# Patient Record
Sex: Female | Born: 1955 | Race: Black or African American | Hispanic: No | Marital: Married | Smoking: Former smoker
Health system: Southern US, Community
[De-identification: ages and names within clinical notes are randomized; demographics above are authoritative.]

## PROBLEM LIST (undated history)

## (undated) DIAGNOSIS — J45909 Unspecified asthma, uncomplicated: Secondary | ICD-10-CM

## (undated) DIAGNOSIS — E119 Type 2 diabetes mellitus without complications: Secondary | ICD-10-CM

## (undated) DIAGNOSIS — Z9889 Other specified postprocedural states: Secondary | ICD-10-CM

## (undated) DIAGNOSIS — F1910 Other psychoactive substance abuse, uncomplicated: Secondary | ICD-10-CM

## (undated) DIAGNOSIS — T07XXXA Unspecified multiple injuries, initial encounter: Secondary | ICD-10-CM

## (undated) DIAGNOSIS — I10 Essential (primary) hypertension: Secondary | ICD-10-CM

## (undated) DIAGNOSIS — S4992XA Unspecified injury of left shoulder and upper arm, initial encounter: Secondary | ICD-10-CM

## (undated) DIAGNOSIS — S81809A Unspecified open wound, unspecified lower leg, initial encounter: Secondary | ICD-10-CM

## (undated) DIAGNOSIS — S82143A Displaced bicondylar fracture of unspecified tibia, initial encounter for closed fracture: Secondary | ICD-10-CM

## (undated) DIAGNOSIS — T7840XA Allergy, unspecified, initial encounter: Secondary | ICD-10-CM

## (undated) DIAGNOSIS — K219 Gastro-esophageal reflux disease without esophagitis: Secondary | ICD-10-CM

## (undated) DIAGNOSIS — N926 Irregular menstruation, unspecified: Secondary | ICD-10-CM

## (undated) DIAGNOSIS — E785 Hyperlipidemia, unspecified: Secondary | ICD-10-CM

## (undated) DIAGNOSIS — S72113A Displaced fracture of greater trochanter of unspecified femur, initial encounter for closed fracture: Secondary | ICD-10-CM

## (undated) HISTORY — DX: Allergy, unspecified, initial encounter: T78.40XA

## (undated) HISTORY — DX: Unspecified asthma, uncomplicated: J45.909

## (undated) HISTORY — DX: Displaced bicondylar fracture of unspecified tibia, initial encounter for closed fracture: S82.143A

## (undated) HISTORY — PX: ROTATOR CUFF REPAIR: SHX139

## (undated) HISTORY — DX: Displaced fracture of greater trochanter of unspecified femur, initial encounter for closed fracture: S72.113A

## (undated) HISTORY — DX: Essential (primary) hypertension: I10

## (undated) HISTORY — DX: Hyperlipidemia, unspecified: E78.5

## (undated) HISTORY — PX: HIP SURGERY: SHX245

## (undated) HISTORY — DX: Unspecified injury of left shoulder and upper arm, initial encounter: S49.92XA

## (undated) HISTORY — DX: Other psychoactive substance abuse, uncomplicated: F19.10

## (undated) HISTORY — DX: Unspecified multiple injuries, initial encounter: T07.XXXA

## (undated) HISTORY — DX: Gastro-esophageal reflux disease without esophagitis: K21.9

## (undated) HISTORY — PX: OTHER SURGICAL HISTORY: SHX169

## (undated) HISTORY — PX: FOOT SURGERY: SHX648

## (undated) HISTORY — PX: SIGMOIDOSCOPY: SUR1295

## (undated) HISTORY — DX: Other specified postprocedural states: Z98.89

## (undated) HISTORY — DX: Irregular menstruation, unspecified: N92.6

## (undated) HISTORY — PX: CERVICAL SPINE SURGERY: SHX589

## (undated) HISTORY — DX: Unspecified open wound, unspecified lower leg, initial encounter: S81.809A

---

## 1998-01-29 ENCOUNTER — Emergency Department (HOSPITAL_COMMUNITY): Admission: EM | Admit: 1998-01-29 | Discharge: 1998-01-29 | Payer: Self-pay | Admitting: Family Medicine

## 2001-02-07 ENCOUNTER — Encounter: Admission: RE | Admit: 2001-02-07 | Discharge: 2001-05-08 | Payer: Self-pay | Admitting: Family Medicine

## 2003-10-13 ENCOUNTER — Encounter: Admission: RE | Admit: 2003-10-13 | Discharge: 2003-10-13 | Payer: Self-pay | Admitting: Family Medicine

## 2003-10-17 ENCOUNTER — Emergency Department (HOSPITAL_COMMUNITY): Admission: EM | Admit: 2003-10-17 | Discharge: 2003-10-17 | Payer: Self-pay | Admitting: Emergency Medicine

## 2003-10-29 ENCOUNTER — Encounter: Admission: RE | Admit: 2003-10-29 | Discharge: 2003-10-29 | Payer: Self-pay | Admitting: Sports Medicine

## 2003-10-30 ENCOUNTER — Encounter: Admission: RE | Admit: 2003-10-30 | Discharge: 2003-10-30 | Payer: Self-pay | Admitting: Family Medicine

## 2004-06-23 ENCOUNTER — Ambulatory Visit: Payer: Self-pay | Admitting: Family Medicine

## 2004-07-13 ENCOUNTER — Ambulatory Visit: Payer: Self-pay | Admitting: Family Medicine

## 2004-07-19 ENCOUNTER — Ambulatory Visit: Payer: Self-pay | Admitting: Family Medicine

## 2004-09-27 ENCOUNTER — Ambulatory Visit: Payer: Self-pay | Admitting: Sports Medicine

## 2004-09-29 ENCOUNTER — Encounter: Admission: RE | Admit: 2004-09-29 | Discharge: 2004-09-29 | Payer: Self-pay | Admitting: Sports Medicine

## 2005-03-07 ENCOUNTER — Emergency Department (HOSPITAL_COMMUNITY): Admission: EM | Admit: 2005-03-07 | Discharge: 2005-03-07 | Payer: Self-pay | Admitting: Emergency Medicine

## 2005-05-11 ENCOUNTER — Encounter (INDEPENDENT_AMBULATORY_CARE_PROVIDER_SITE_OTHER): Payer: Self-pay | Admitting: *Deleted

## 2005-05-11 LAB — CONVERTED CEMR LAB

## 2005-05-16 ENCOUNTER — Ambulatory Visit: Payer: Self-pay | Admitting: Sports Medicine

## 2005-06-01 ENCOUNTER — Encounter: Admission: RE | Admit: 2005-06-01 | Discharge: 2005-06-01 | Payer: Self-pay | Admitting: Sports Medicine

## 2005-06-06 ENCOUNTER — Ambulatory Visit: Payer: Self-pay | Admitting: Family Medicine

## 2005-06-09 ENCOUNTER — Ambulatory Visit: Payer: Self-pay | Admitting: Sports Medicine

## 2005-06-13 ENCOUNTER — Encounter: Admission: RE | Admit: 2005-06-13 | Discharge: 2005-06-13 | Payer: Self-pay | Admitting: Family Medicine

## 2005-06-30 ENCOUNTER — Ambulatory Visit: Payer: Self-pay | Admitting: Family Medicine

## 2005-07-27 ENCOUNTER — Ambulatory Visit: Payer: Self-pay | Admitting: Family Medicine

## 2005-11-11 ENCOUNTER — Ambulatory Visit: Payer: Self-pay | Admitting: Family Medicine

## 2005-11-18 ENCOUNTER — Ambulatory Visit: Payer: Self-pay | Admitting: Family Medicine

## 2005-11-24 ENCOUNTER — Encounter: Admission: RE | Admit: 2005-11-24 | Discharge: 2005-11-24 | Payer: Self-pay | Admitting: *Deleted

## 2006-01-24 ENCOUNTER — Ambulatory Visit: Payer: Self-pay | Admitting: Family Medicine

## 2006-09-07 DIAGNOSIS — E1165 Type 2 diabetes mellitus with hyperglycemia: Secondary | ICD-10-CM | POA: Insufficient documentation

## 2006-09-07 DIAGNOSIS — J45909 Unspecified asthma, uncomplicated: Secondary | ICD-10-CM

## 2006-09-07 DIAGNOSIS — M545 Low back pain, unspecified: Secondary | ICD-10-CM | POA: Insufficient documentation

## 2006-09-07 DIAGNOSIS — Z720 Tobacco use: Secondary | ICD-10-CM | POA: Insufficient documentation

## 2006-09-07 DIAGNOSIS — J309 Allergic rhinitis, unspecified: Secondary | ICD-10-CM | POA: Insufficient documentation

## 2006-09-07 DIAGNOSIS — Z87891 Personal history of nicotine dependence: Secondary | ICD-10-CM | POA: Insufficient documentation

## 2006-09-07 DIAGNOSIS — F17200 Nicotine dependence, unspecified, uncomplicated: Secondary | ICD-10-CM

## 2006-09-07 DIAGNOSIS — K219 Gastro-esophageal reflux disease without esophagitis: Secondary | ICD-10-CM

## 2006-09-07 DIAGNOSIS — I10 Essential (primary) hypertension: Secondary | ICD-10-CM | POA: Insufficient documentation

## 2006-09-07 DIAGNOSIS — E119 Type 2 diabetes mellitus without complications: Secondary | ICD-10-CM | POA: Insufficient documentation

## 2006-09-07 HISTORY — DX: Gastro-esophageal reflux disease without esophagitis: K21.9

## 2006-09-07 HISTORY — DX: Unspecified asthma, uncomplicated: J45.909

## 2006-09-08 ENCOUNTER — Encounter (INDEPENDENT_AMBULATORY_CARE_PROVIDER_SITE_OTHER): Payer: Self-pay | Admitting: *Deleted

## 2006-11-21 ENCOUNTER — Telehealth: Payer: Self-pay | Admitting: *Deleted

## 2007-03-31 ENCOUNTER — Emergency Department (HOSPITAL_COMMUNITY): Admission: EM | Admit: 2007-03-31 | Discharge: 2007-04-01 | Payer: Self-pay | Admitting: Emergency Medicine

## 2007-04-04 ENCOUNTER — Telehealth: Payer: Self-pay | Admitting: *Deleted

## 2007-04-05 ENCOUNTER — Ambulatory Visit: Payer: Self-pay | Admitting: Family Medicine

## 2007-04-18 ENCOUNTER — Ambulatory Visit: Payer: Self-pay | Admitting: Family Medicine

## 2007-04-18 ENCOUNTER — Encounter (INDEPENDENT_AMBULATORY_CARE_PROVIDER_SITE_OTHER): Payer: Self-pay | Admitting: *Deleted

## 2007-04-18 DIAGNOSIS — I10 Essential (primary) hypertension: Secondary | ICD-10-CM | POA: Insufficient documentation

## 2007-04-18 DIAGNOSIS — E782 Mixed hyperlipidemia: Secondary | ICD-10-CM | POA: Insufficient documentation

## 2007-04-18 LAB — CONVERTED CEMR LAB: Hgb A1c MFr Bld: 10.8 %

## 2007-04-19 LAB — CONVERTED CEMR LAB
ALT: <8 units/L (ref 0–35)
AST: 11 units/L (ref 0–37)
Albumin: 4.1 g/dL (ref 3.5–5.2)
Alkaline Phosphatase: 76 units/L (ref 39–117)
BUN: 15 mg/dL (ref 6–23)
CO2: 24 meq/L (ref 19–32)
Calcium: 9.4 mg/dL (ref 8.4–10.5)
Chloride: 102 meq/L (ref 96–112)
Creatinine, Ser: 0.7 mg/dL (ref 0.40–1.20)
Direct LDL: 162 mg/dL — ABNORMAL HIGH
Glucose, Bld: 380 mg/dL — ABNORMAL HIGH (ref 70–99)
Potassium: 4.8 meq/L (ref 3.5–5.3)
Sodium: 135 meq/L (ref 135–145)
Total Bilirubin: 0.3 mg/dL (ref 0.3–1.2)
Total Protein: 7.5 g/dL (ref 6.0–8.3)

## 2008-01-25 ENCOUNTER — Telehealth: Payer: Self-pay | Admitting: *Deleted

## 2008-02-01 ENCOUNTER — Telehealth: Payer: Self-pay | Admitting: *Deleted

## 2008-02-01 ENCOUNTER — Ambulatory Visit: Payer: Self-pay | Admitting: Family Medicine

## 2008-02-01 LAB — CONVERTED CEMR LAB: Hgb A1c MFr Bld: 12.3 %

## 2008-02-06 ENCOUNTER — Ambulatory Visit: Payer: Self-pay | Admitting: Family Medicine

## 2008-02-06 ENCOUNTER — Encounter: Payer: Self-pay | Admitting: Family Medicine

## 2008-02-08 ENCOUNTER — Encounter: Payer: Self-pay | Admitting: Family Medicine

## 2008-02-08 LAB — CONVERTED CEMR LAB
BUN: 12 mg/dL (ref 6–23)
CO2: 20 meq/L (ref 19–32)
Calcium: 9.2 mg/dL (ref 8.4–10.5)
Chloride: 104 meq/L (ref 96–112)
Cholesterol: 186 mg/dL (ref 0–200)
Creatinine, Ser: 0.66 mg/dL (ref 0.40–1.20)
Glucose, Bld: 235 mg/dL — ABNORMAL HIGH (ref 70–99)
HDL: 41 mg/dL (ref >39)
LDL Cholesterol: 124 mg/dL — ABNORMAL HIGH (ref 0–99)
Potassium: 4.4 meq/L (ref 3.5–5.3)
Sodium: 138 meq/L (ref 135–145)
Total CHOL/HDL Ratio: 4.5
Triglycerides: 106 mg/dL (ref <150)
VLDL: 21 mg/dL (ref 0–40)

## 2008-03-04 ENCOUNTER — Emergency Department (HOSPITAL_COMMUNITY): Admission: EM | Admit: 2008-03-04 | Discharge: 2008-03-04 | Payer: Self-pay | Admitting: Emergency Medicine

## 2008-03-25 ENCOUNTER — Encounter (INDEPENDENT_AMBULATORY_CARE_PROVIDER_SITE_OTHER): Payer: Self-pay | Admitting: *Deleted

## 2008-03-25 ENCOUNTER — Ambulatory Visit: Payer: Self-pay | Admitting: Family Medicine

## 2008-03-25 DIAGNOSIS — R109 Unspecified abdominal pain: Secondary | ICD-10-CM | POA: Insufficient documentation

## 2008-03-26 ENCOUNTER — Encounter: Payer: Self-pay | Admitting: Family Medicine

## 2008-03-26 ENCOUNTER — Encounter (INDEPENDENT_AMBULATORY_CARE_PROVIDER_SITE_OTHER): Payer: Self-pay | Admitting: *Deleted

## 2008-04-07 ENCOUNTER — Telehealth: Payer: Self-pay | Admitting: Family Medicine

## 2008-04-08 ENCOUNTER — Encounter: Payer: Self-pay | Admitting: Family Medicine

## 2008-04-08 ENCOUNTER — Ambulatory Visit: Payer: Self-pay | Admitting: Family Medicine

## 2008-04-17 ENCOUNTER — Encounter: Payer: Self-pay | Admitting: Family Medicine

## 2008-04-17 LAB — CONVERTED CEMR LAB
Basophils Absolute: 0.1 10*3/uL (ref 0.0–0.1)
Basophils Relative: 1 % (ref 0–1)
CRP, High Sensitivity: 0.6
Eosinophils Absolute: 0.3 10*3/uL (ref 0.0–0.7)
Eosinophils Relative: 3 % (ref 0–5)
HCT: 40.1 % (ref 36.0–46.0)
Hemoglobin: 12.7 g/dL (ref 12.0–15.0)
Lymphocytes Relative: 28 % (ref 12–46)
Lymphs Abs: 2.9 10*3/uL (ref 0.7–4.0)
MCHC: 31.7 g/dL (ref 30.0–36.0)
MCV: 87.4 fL (ref 78.0–100.0)
Monocytes Absolute: 0.8 10*3/uL (ref 0.1–1.0)
Monocytes Relative: 8 % (ref 3–12)
Neutro Abs: 6 10*3/uL (ref 1.7–7.7)
Neutrophils Relative %: 60 % (ref 43–77)
Platelets: 427 10*3/uL — ABNORMAL HIGH (ref 150–400)
RBC: 4.59 M/uL (ref 3.87–5.11)
RDW: 15.2 % (ref 11.5–15.5)
WBC: 10.1 10*3/uL (ref 4.0–10.5)

## 2008-05-01 ENCOUNTER — Telehealth: Payer: Self-pay | Admitting: Family Medicine

## 2008-05-05 ENCOUNTER — Encounter: Admission: RE | Admit: 2008-05-05 | Discharge: 2008-05-05 | Payer: Self-pay | Admitting: Orthopaedic Surgery

## 2008-05-12 ENCOUNTER — Telehealth: Payer: Self-pay | Admitting: Family Medicine

## 2008-08-20 ENCOUNTER — Telehealth: Payer: Self-pay | Admitting: *Deleted

## 2008-08-29 ENCOUNTER — Encounter: Admission: RE | Admit: 2008-08-29 | Discharge: 2008-08-29 | Payer: Self-pay | Admitting: Family Medicine

## 2008-09-04 ENCOUNTER — Encounter: Payer: Self-pay | Admitting: Family Medicine

## 2008-09-04 ENCOUNTER — Encounter: Admission: RE | Admit: 2008-09-04 | Discharge: 2008-09-04 | Payer: Self-pay | Admitting: Family Medicine

## 2008-09-05 ENCOUNTER — Telehealth (INDEPENDENT_AMBULATORY_CARE_PROVIDER_SITE_OTHER): Payer: Self-pay | Admitting: *Deleted

## 2008-09-19 ENCOUNTER — Ambulatory Visit: Payer: Self-pay | Admitting: Family Medicine

## 2008-09-19 ENCOUNTER — Encounter: Payer: Self-pay | Admitting: Family Medicine

## 2008-09-19 DIAGNOSIS — N926 Irregular menstruation, unspecified: Secondary | ICD-10-CM | POA: Insufficient documentation

## 2008-09-19 HISTORY — DX: Irregular menstruation, unspecified: N92.6

## 2008-09-19 LAB — CONVERTED CEMR LAB
FSH: 35.1 milliintl units/mL
HCT: 38.9 % (ref 36.0–46.0)
Hemoglobin: 12.2 g/dL (ref 12.0–15.0)
Hgb A1c MFr Bld: 6.9 %
MCHC: 31.4 g/dL (ref 30.0–36.0)
MCV: 85.9 fL (ref 78.0–100.0)
Platelets: 499 10*3/uL — ABNORMAL HIGH (ref 150–400)
RBC: 4.53 M/uL (ref 3.87–5.11)
RDW: 14.3 % (ref 11.5–15.5)
WBC: 8.5 10*3/uL (ref 4.0–10.5)

## 2008-09-23 ENCOUNTER — Encounter: Admission: RE | Admit: 2008-09-23 | Discharge: 2008-09-23 | Payer: Self-pay | Admitting: Family Medicine

## 2008-09-25 DIAGNOSIS — N9489 Other specified conditions associated with female genital organs and menstrual cycle: Secondary | ICD-10-CM | POA: Insufficient documentation

## 2008-09-25 LAB — CONVERTED CEMR LAB: Pap Smear: NORMAL

## 2008-10-09 ENCOUNTER — Ambulatory Visit: Payer: Self-pay | Admitting: Family Medicine

## 2009-01-13 ENCOUNTER — Telehealth (INDEPENDENT_AMBULATORY_CARE_PROVIDER_SITE_OTHER): Payer: Self-pay | Admitting: Pharmacist

## 2009-04-24 ENCOUNTER — Telehealth: Payer: Self-pay | Admitting: Family Medicine

## 2009-05-01 ENCOUNTER — Ambulatory Visit: Payer: Self-pay | Admitting: Family Medicine

## 2009-05-01 DIAGNOSIS — M67919 Unspecified disorder of synovium and tendon, unspecified shoulder: Secondary | ICD-10-CM | POA: Insufficient documentation

## 2009-05-01 DIAGNOSIS — M719 Bursopathy, unspecified: Secondary | ICD-10-CM

## 2009-05-01 LAB — CONVERTED CEMR LAB: Hgb A1c MFr Bld: 11 %

## 2009-05-08 ENCOUNTER — Encounter: Payer: Self-pay | Admitting: Family Medicine

## 2009-05-08 ENCOUNTER — Ambulatory Visit: Payer: Self-pay | Admitting: Family Medicine

## 2009-05-08 LAB — CONVERTED CEMR LAB
ALT: <8 units/L (ref 0–35)
AST: 7 units/L (ref 0–37)
Albumin: 3.7 g/dL (ref 3.5–5.2)
Alkaline Phosphatase: 88 units/L (ref 39–117)
BUN: 16 mg/dL (ref 6–23)
CO2: 21 meq/L (ref 19–32)
Calcium: 9.1 mg/dL (ref 8.4–10.5)
Chloride: 102 meq/L (ref 96–112)
Cholesterol: 169 mg/dL (ref 0–200)
Creatinine, Ser: 0.67 mg/dL (ref 0.40–1.20)
Glucose, Bld: 290 mg/dL — ABNORMAL HIGH (ref 70–99)
HDL: 34 mg/dL — ABNORMAL LOW (ref >39)
LDL Cholesterol: 116 mg/dL — ABNORMAL HIGH (ref 0–99)
Potassium: 4.2 meq/L (ref 3.5–5.3)
Sodium: 138 meq/L (ref 135–145)
Total Bilirubin: 0.2 mg/dL — ABNORMAL LOW (ref 0.3–1.2)
Total CHOL/HDL Ratio: 5
Total Protein: 6.7 g/dL (ref 6.0–8.3)
Triglycerides: 96 mg/dL (ref <150)
VLDL: 19 mg/dL (ref 0–40)

## 2009-05-12 ENCOUNTER — Encounter: Payer: Self-pay | Admitting: Family Medicine

## 2009-05-26 ENCOUNTER — Encounter: Payer: Self-pay | Admitting: Family Medicine

## 2009-05-26 ENCOUNTER — Ambulatory Visit: Payer: Self-pay | Admitting: Family Medicine

## 2009-06-24 ENCOUNTER — Telehealth: Payer: Self-pay | Admitting: Family Medicine

## 2009-08-11 ENCOUNTER — Telehealth: Payer: Self-pay | Admitting: Family Medicine

## 2009-09-21 ENCOUNTER — Encounter: Admission: RE | Admit: 2009-09-21 | Discharge: 2009-09-21 | Payer: Self-pay | Admitting: Family Medicine

## 2009-09-30 ENCOUNTER — Ambulatory Visit: Payer: Self-pay | Admitting: Family Medicine

## 2009-09-30 LAB — CONVERTED CEMR LAB: Hgb A1c MFr Bld: >14 %

## 2009-10-23 ENCOUNTER — Ambulatory Visit: Payer: Self-pay | Admitting: Family Medicine

## 2009-10-23 DIAGNOSIS — M542 Cervicalgia: Secondary | ICD-10-CM | POA: Insufficient documentation

## 2009-12-18 ENCOUNTER — Telehealth: Payer: Self-pay | Admitting: Family Medicine

## 2009-12-21 ENCOUNTER — Telehealth: Payer: Self-pay | Admitting: Family Medicine

## 2009-12-21 ENCOUNTER — Encounter: Payer: Self-pay | Admitting: Family Medicine

## 2010-01-07 ENCOUNTER — Ambulatory Visit: Payer: Self-pay | Admitting: Family Medicine

## 2010-01-07 LAB — CONVERTED CEMR LAB
Albumin/Creatinine Ratio, Urine, POC: ABNORMAL
Hgb A1c MFr Bld: 11.5 %
Microalbumin U total vol: 10 mg/L

## 2010-01-26 ENCOUNTER — Telehealth: Payer: Self-pay | Admitting: Family Medicine

## 2010-02-11 ENCOUNTER — Telehealth: Payer: Self-pay | Admitting: *Deleted

## 2010-03-11 ENCOUNTER — Encounter: Payer: Self-pay | Admitting: Family Medicine

## 2010-03-11 ENCOUNTER — Ambulatory Visit: Payer: Self-pay | Admitting: Family Medicine

## 2010-03-11 DIAGNOSIS — Z9889 Other specified postprocedural states: Secondary | ICD-10-CM

## 2010-03-11 HISTORY — DX: Other specified postprocedural states: Z98.89

## 2010-03-24 ENCOUNTER — Encounter: Payer: Self-pay | Admitting: Family Medicine

## 2010-05-07 ENCOUNTER — Encounter (INDEPENDENT_AMBULATORY_CARE_PROVIDER_SITE_OTHER): Payer: Self-pay | Admitting: Pharmacist

## 2010-08-10 NOTE — Progress Notes (Signed)
Summary: Rx Req  Phone Note Refill Request Call back at [facility] Phone [phone] Message from:  Patient  Refills Requested: Medication #1:  MOBIC 7.5 MG TABS one by mouth daily for pain PT USES [facility] IN [place] [address]  Initial call taken by: Clydell Hakim,  August 11, 2009 10:26 AM  Follow-up for Phone Call        will forward to MD. Follow-up by: Theresia Lo RN,  August 11, 2009 2:45 PM    Prescriptions: MOBIC 7.5 MG TABS (MELOXICAM) one by mouth daily for pain  #30 x 2   Entered and Authorized by:   Myrtie Soman  MD   Signed by:   Myrtie Soman  MD on 08/17/2009   Method used:   Electronically to        [facility] (retail)       [address], [address]       [place], [place]  [address]       Ph: [phone]       Fax: [phone]   RxID:   [id number]

## 2010-08-10 NOTE — Letter (Signed)
Summary: Generic Letter  [facility]  [address]   [place], [place] [address]   Phone: [phone]  Fax: [phone]    03/26/2008  [facility] [place]ROBINSON [address] [place], [place]  [address]  Dear Ms. PAYNE-ROBINSON,   I have scheduled you for an appointment with Dr. Loreta Ave at [facility] to discuss your colonoscopy on April 10, 2008 at 1:45pm.  You will not be having the actual colonoscopy this day, you will be discussing the procedure. Their office address is [address], [address], [address], [place], [place] [address], and their phone number is [phone].  Please call their office if you need to reschedule.          Sincerely,   Enzo Treu MARTIN RN [facility]

## 2010-08-10 NOTE — Progress Notes (Signed)
Summary: refill  Phone Note Refill Request Call back at Home Phone [phone] Message from:  Patient  Refills Requested: Medication #1:  TRAMADOL HCL 50 MG TABS 1-2 tabs by mouth every 6 hours as needed [facility] - [phone] x [phone]   Initial call taken by: De Nurse,  December 18, 2009 3:22 PM  Follow-up for Phone Call        called in 90 pills, called pt told her she needs to be seen  Follow-up by: Antoine Primas DO,  December 21, 2009 10:18 AM    Prescriptions: TRAMADOL HCL 50 MG TABS (TRAMADOL HCL) 1-2 tabs by mouth every 6 hours as needed  #90 x 0   Entered by:   Antoine Primas DO   Authorized by:   Angelena Sole MD   Signed by:   Antoine Primas DO on 12/21/2009   Method used:   Telephoned to ...         RxID:   [id number]

## 2010-08-10 NOTE — Letter (Signed)
Summary: Out of Work  [facility]  [address]   [place], [place] [address]   Phone: [phone]  Fax: [phone]    March 11, 2010   Employee:  [facility] PAYNE-ROBINSON    To Whom It May Concern:   For Medical reasons, please excuse the above named employee from work for the following dates:  Start:   03-11-10  End:   03-17-10  If you need additional information, please feel free to contact our office.         Sincerely,    Alvia Grove DO

## 2010-08-10 NOTE — Progress Notes (Signed)
Summary: Returning call  Phone Note Call from Patient   Summary of Call: pt is returning Dr. Danford Bad call, she saw gyn MD, had ultrasound done & everything was fine. Initial call taken by: Knox Royalty,  December 21, 2009 3:25 PM  Follow-up for Phone Call        called pt and asked her to give [place] the name of ob-gyn office she saw so that we can get records . Follow-up by: Myrtie Soman  MD,  December 30, 2009 11:26 AM         Past History:  Past Medical History: hemorrhoids Diabetes mellitus, type II Hyperlipidemia Hypertension  mammogram normal 3/10 ovarian mass on [place] 3/10 -- per report pt had gyn follow-up and "everything was fine." but we do not have these records.   Appended Document: Returning call pt went to [facility] last March 2010  Appended Document: Returning call Called [facility].  She did not keep her appt.

## 2010-08-10 NOTE — Assessment & Plan Note (Signed)
Summary: FU/KH   Vital Signs:  Patient profile:   55 year old female Height:      64.5 inches Weight:      145.1 pounds BMI:     24.61 Temp:     98.1 degrees F oral Pulse rate:   88 / minute BP sitting:   133 / 81  (left arm) Cuff size:   regular  Vitals Entered By: Garen Grams LPN (October 23, 2009 11:34 AM) CC: f/u dm Is Patient Diabetic? Yes Did you bring your meter with you today? No   Primary Care Provider:  Myrtie Soman  MD  CC:  f/u dm.  History of Present Illness: 1. Diabetes taking medications:misses doses at night fairly frequently problems with medications?: no blood sugar testing frequency: when she thinks about it hypoglycemic events?: no subjective: has been doing better but often forgets doses. Does not check sugars on a schedule. Is trying to eat better but drinks lots of juice. Trying to stay away from breads, pancakes, biscuits. Not exercising regularly.  ROS chest pain: no   shortness of breath: no   polyuria:  no   polydipsia: no    problems with feet: no  2. Smoking Has the chantix but hasn't started it yet. Plans on starting once school gets out for the summer.   3. Hypertension adherent to medications: yes side effects from medications: no subjective:   ROS: HA:  no    swelling: no  vision changes: states that glasses don't work to well, notices a halo around car lights at night.   4. neck pain worse with overhead activities and at the end of a days work. occasionally has some shooting pains into the arm. No numbness or tingling.   Habits & Providers  Alcohol-Tobacco-Diet     Tobacco Status: current     Tobacco Counseling: to quit use of tobacco products  Current Medications (verified): 1)  Aspirin Childrens 81 Mg Chew (Aspirin) .... Take 1 Tablet By Mouth Once A Day 2)  Simvastatin 40 Mg Tabs (Simvastatin) .... Take One Tablet At Bedtime 3)  Metformin Hcl 1000 Mg Tabs (Metformin Hcl) .... Take 1 Tablet By Mouth Two Times A Day 4)   Glipizide 10 Mg  Tabs (Glipizide) .Marland Kitchen.. 1 By Mouth Two Times Daily 5)  Lisinopril 10 Mg Tabs (Lisinopril) .... One By Mouth Daily 6)  Chantix Starting Month Pak 0.5 Mg X 11 & 1 Mg X 42 Tabs (Varenicline Tartrate) .... Take Per Package Instructions 7)  Freestyle Lancets  Misc (Lancets) .... Use Daily Before Breakfast; Dispense 1 Box. 8)  Freestyle System  Kit (Blood Glucose Monitoring Suppl) .... Check Sugars Daily Before Breakfast 9)  Freestyle Lite Test  Strp (Glucose Blood) .... Test Sugar Daily Before Breakfast; Dispense 100 Strips (1 Box) 10)  Chantix Continuing Month Pak 1 Mg Tabs (Varenicline Tartrate) .... One By Mouth Two Times A Day As Directed; Disp 1 Pack 11)  Tramadol Hcl 50 Mg Tabs (Tramadol Hcl) .Marland Kitchen.. 1-2 Tabs By Mouth Every 6 Hours As Needed  Allergies (verified): No Known Drug Allergies  Physical Exam  General:  vital signs reviewed and normal Alert, appropriate; well-dressed and well-nourished  Lungs:  work of breathing unlabored, clear to auscultation bilaterally; no wheezes, rales, or ronchi; good air movement throughout  Heart:  regular rate and rhythm, no murmurs; normal s1/s2  Msk:  neck - some non-specific c-spine tenderness to palpation; pain with flexion and extension, mildly limited ROM; pain with rightward flexion; no  pain with rotation but limited rightward rotation. Spurling's elicits local neck pain but no radicular symptoms. Skin:  warm, good turgor; no rashes or lesions.  Psych:  Cognition and judgment appear intact. Alert and cooperative with normal attention span and concentration. No apparent delusions, illusions, hallucinations   Impression & Recommendations:  Problem # 1:  DIABETES MELLITUS, TYPE II (ICD-250.00) Assessment Unchanged modestly improved sugars per reports. Discussed the importance of adherence to medical therapy to prevent future complications. Also mentioned that we may need insulin but this could possibly be prevented if she does her best  to adhere to meds. Cap glucose elevated today.  Her updated medication list for this problem includes:    Aspirin Childrens 81 Mg Chew (Aspirin) .Marland Kitchen... Take 1 tablet by mouth once a day    Metformin Hcl 1000 Mg Tabs (Metformin hcl) .Marland Kitchen... Take 1 tablet by mouth two times a day    Glipizide 10 Mg Tabs (Glipizide) .Marland Kitchen... 1 by mouth two times daily    Lisinopril 10 Mg Tabs (Lisinopril) ..... One by mouth daily  Orders: Glucose Cap-[facility] ([address]) [facility]- Est  Level 4 ([address])Future Orders: Comp Met-[facility] ([id number]) ... 10/29/2010 Direct LDL-[facility] [phone]) ... 10/22/2010  Problem # 2:  TOBACCO DEPENDENCE (ICD-305.1) Assessment: Unchanged  planning to quit over summer Her updated medication list for this problem includes:    Chantix Starting Month Pak 0.5 Mg X 11 & 1 Mg X 42 Tabs (Varenicline tartrate) .Marland Kitchen... Take per package instructions    Chantix Continuing Month Pak 1 Mg Tabs (Varenicline tartrate) ..... One by mouth two times a day as directed; disp 1 pack  Orders: [facility]- Est  Level 4 ([address])  Problem # 3:  HYPERTENSION (ICD-401.9) Assessment: Unchanged  stable. continue ACEi.   Her updated medication list for this problem includes:    Lisinopril 10 Mg Tabs (Lisinopril) ..... One by mouth daily  Orders: [facility]- Est  Level 4 ([address])  Problem # 4:  NECK PAIN (ICD-723.1) Assessment: New  exam nonspecific today; has h/o rotator cuff syndrome on R. d/c mobic given CV risk associated with this. Increase tylenol and start tramadol. consider imaging if no improvement at next visit. possibly a neuropathic compenent. consider gabepentin. continue to follow.   The following medications were removed from the medication list:    Mobic 7.5 Mg Tabs (Meloxicam) ..... One by mouth daily for pain Her updated medication list for this problem includes:    Aspirin Childrens 81 Mg Chew (Aspirin) .Marland Kitchen... Take 1 tablet by mouth once a day    Tramadol Hcl 50 Mg Tabs (Tramadol hcl) .Marland Kitchen... 1-2 tabs by mouth every  6 hours as needed    Tylenol Arthritis Pain 650 Mg Cr-tabs (Acetaminophen) ..... One by mouth every 6 hours as needed  Orders: [facility]- Est  Level 4 ([address])  Complete Medication List: 1)  Aspirin Childrens 81 Mg Chew (Aspirin) .... Take 1 tablet by mouth once a day 2)  Simvastatin 40 Mg Tabs (Simvastatin) .... Take one tablet at bedtime 3)  Metformin Hcl 1000 Mg Tabs (Metformin hcl) .... Take 1 tablet by mouth two times a day 4)  Glipizide 10 Mg Tabs (Glipizide) .Marland Kitchen.. 1 by mouth two times daily 5)  Lisinopril 10 Mg Tabs (Lisinopril) .... One by mouth daily 6)  Chantix Starting Month Pak 0.5 Mg X 11 & 1 Mg X 42 Tabs (Varenicline tartrate) .... Take per package instructions 7)  Freestyle Lancets Misc (Lancets) .... Use daily before breakfast; dispense 1 box. 8)  Freestyle  System Kit (Blood glucose monitoring suppl) .... Check sugars daily before breakfast 9)  Freestyle Lite Test Strp (Glucose blood) .... Test sugar daily before breakfast; dispense 100 strips (1 box) 10)  Chantix Continuing Month Pak 1 Mg Tabs (Varenicline tartrate) .... One by mouth two times a day as directed; disp 1 pack 11)  Tramadol Hcl 50 Mg Tabs (Tramadol hcl) .Marland Kitchen.. 1-2 tabs by mouth every 6 hours as needed 12)  Tylenol Arthritis Pain 650 Mg Cr-tabs (Acetaminophen) .... One by mouth every 6 hours as needed  Patient Instructions: 1)  it's very important that you take your medicines regularly and don't miss doeses. set a reminder on your phone 2)  start the tramadol and tylenol -- take each every 6 hours 3)  use heat on your neck.  4)  make sure you visit the eye doctor soon.  5)  great to see you today. 6)  follow-up in one month Prescriptions: TYLENOL ARTHRITIS PAIN 650 MG CR-TABS (ACETAMINOPHEN) one by mouth every 6 hours as needed  #30 x 1   Entered and Authorized by:   Myrtie Soman  MD   Signed by:   Myrtie Soman  MD on 10/23/2009   Method used:   Electronically to        [facility] (retail)        [address], [address]       [place], [place]  [address]       Ph: [phone]       Fax: [phone]   RxID:   [id number] TRAMADOL HCL 50 MG TABS (TRAMADOL HCL) 1-2 tabs by mouth every 6 hours as needed  #60 x 1   Entered and Authorized by:   Myrtie Soman  MD   Signed by:   Myrtie Soman  MD on 10/23/2009   Method used:   Electronically to        [facility] [address] (retail)       [address], [address]       [place], [place]  [address]       Ph: [phone]       Fax: [phone]   RxID:   [id number]

## 2010-08-10 NOTE — Miscellaneous (Signed)
Summary: chart update  Clinical Lists Changes  Problems: Removed problem of VIRAL URI (ICD-465.9) Assessed OVARIAN MASS as comment only - on U/S 3/10. Pt has not followed up regularly since this time. I do not know if follow-up for this mass has taken place as recommended. I have called the patient (left VM) and sent a letter to get more information on this and am waiting on a response. Observations: Added new observation of PAST MED HX: hemorrhoids Diabetes mellitus, type II Hyperlipidemia Hypertension  mammogram normal 3/10 ovarian mass on [place] 3/10 -- I do not know if follow-up with gyn as advised has taken place.    (12/21/2009 11:08) Added new observation of PRIMARY MD: Myrtie Soman  MD (12/21/2009 11:08)       Impression & Recommendations:  Problem # 1:  OVARIAN MASS (ICD-625.8) on U/S 3/10. Pt has not followed up regularly since this time. I do not know if follow-up for this mass has taken place as recommended. I have called the patient (left VM) and sent a letter to get more information on this and am waiting on a response.   Complete Medication List: 1)  Aspirin Childrens 81 Mg Chew (Aspirin) .... Take 1 tablet by mouth once a day 2)  Simvastatin 40 Mg Tabs (Simvastatin) .... Take one tablet at bedtime 3)  Metformin Hcl 1000 Mg Tabs (Metformin hcl) .... Take 1 tablet by mouth two times a day 4)  Glipizide 10 Mg Tabs (Glipizide) .Marland Kitchen.. 1 by mouth two times daily 5)  Lisinopril 10 Mg Tabs (Lisinopril) .... One by mouth daily 6)  Chantix Starting Month Pak 0.5 Mg X 11 & 1 Mg X 42 Tabs (Varenicline tartrate) .... Take per package instructions 7)  Freestyle Lancets Misc (Lancets) .... Use daily before breakfast; dispense 1 box. 8)  Freestyle System Kit (Blood glucose monitoring suppl) .... Check sugars daily before breakfast 9)  Freestyle Lite Test Strp (Glucose blood) .... Test sugar daily before breakfast; dispense 100 strips (1 box) 10)  Chantix Continuing Month Pak 1 Mg  Tabs (Varenicline tartrate) .... One by mouth two times a day as directed; disp 1 pack 11)  Tramadol Hcl 50 Mg Tabs (Tramadol hcl) .Marland Kitchen.. 1-2 tabs by mouth every 6 hours as needed 12)  Tylenol Arthritis Pain 650 Mg Cr-tabs (Acetaminophen) .... One by mouth every 6 hours as needed   Past History:  Past Medical History: hemorrhoids Diabetes mellitus, type II Hyperlipidemia Hypertension  mammogram normal 3/10 ovarian mass on [place] 3/10 -- I do not know if follow-up with gyn as advised has taken place.

## 2010-08-10 NOTE — Miscellaneous (Signed)
   Clinical Lists Changes  Problems: Changed problem from ASTHMA, UNSPECIFIED (ICD-493.90) to ASTHMA, INTERMITTENT (ICD-493.90) 

## 2010-08-10 NOTE — Progress Notes (Signed)
Summary: Rx Req  Phone Note Refill Request Message from:  Patient  Refills Requested: Medication #1:  TRAMADOL HCL 50 MG TABS 1-2 tabs by mouth every 6 hours as needed   Dosage confirmed as above?Dosage Confirmed   Brand Name Necessary? No   Supply Requested: 1 month   Last Refilled: 01/27/2010  Medication #2:  TYLENOL ARTHRITIS PAIN 650 MG CR-TABS one by mouth every 6 hours as needed.   Dosage confirmed as above?Dosage Confirmed   Brand Name Necessary? No   Supply Requested: 3 months   Last Refilled: 01/27/2010 [facility]  Initial call taken by: Clydell Hakim,  January 26, 2010 9:08 AM  Follow-up for Phone Call        faxed in thank you  Follow-up by: Antoine Primas DO,  January 27, 2010 2:03 PM    Prescriptions: TYLENOL ARTHRITIS PAIN 650 MG CR-TABS (ACETAMINOPHEN) one by mouth every 6 hours as needed  #30 x 3   Entered and Authorized by:   Antoine Primas DO   Signed by:   Antoine Primas DO on 01/27/2010   Method used:   Print then Give to Patient   RxID:   [id number] TRAMADOL HCL 50 MG TABS (TRAMADOL HCL) 1-2 tabs by mouth every 6 hours as needed  #90 x 0   Entered and Authorized by:   Antoine Primas DO   Signed by:   Antoine Primas DO on 01/27/2010   Method used:   Print then Give to Patient   RxID:   [id number]

## 2010-08-10 NOTE — Assessment & Plan Note (Signed)
Summary: f/up,tcb   Vital Signs:  Patient profile:   55 year old female Height:      64.5 inches Weight:      143.0 pounds BMI:     24.25 Temp:     98.6 degrees F oral Pulse rate:   98 / minute BP sitting:   134 / 82  (right arm) Cuff size:   regular  Vitals Entered By: Garen Grams LPN (September 30, 2009 3:07 PM) CC: f/u dm, refills Is Patient Diabetic? Yes Did you bring your meter with you today? No   Primary Care Provider:  Myrtie Soman  MD  CC:  f/u dm and refills.  History of Present Illness: 1. Diabetes taking medications: has been out of her medications for the last 3 weeks; even when she had the medicines, wasn't taking them every day like she was supposed to. States that she thought she had to be seen before she could get more medicine.  problems with medications?: no blood sugar testing frequency: doesn't test hypoglycemic events?: no subjective: has not been paying attention to this issue for the last several months. Verbalizes understanding of the long term health risks.   ROS chest pain:no   shortness of breath: no   polyuria: yes  polydipsia:  yes   problems with feet:  joint aches and pains: has occasional sharp pains in legs, back, and side -- feels like a "prick" and goes away when she touches it.   2. smoking Took the chantix and states that it worked. But started smoking again due to stress. Now back to 1/2 PPD  -- this is less than previously. Trying to wean her way off.    Habits & Providers  Alcohol-Tobacco-Diet     Tobacco Status: current     Cigarette Packs/Day: 0.5  Current Medications (verified): 1)  Aspirin Childrens 81 Mg Chew (Aspirin) .... Take 1 Tablet By Mouth Once A Day 2)  Simvastatin 40 Mg Tabs (Simvastatin) .... Take One Tablet At Bedtime 3)  Metformin Hcl 1000 Mg Tabs (Metformin Hcl) .... Take 1 Tablet By Mouth Two Times A Day 4)  Glipizide 10 Mg  Tabs (Glipizide) .Marland Kitchen.. 1 By Mouth Two Times Daily 5)  Lisinopril 10 Mg Tabs  (Lisinopril) .... One By Mouth Daily 6)  Chantix Starting Month Pak 0.5 Mg X 11 & 1 Mg X 42 Tabs (Varenicline Tartrate) .... Take Per Package Instructions 7)  Freestyle Lancets  Misc (Lancets) .... Use Daily Before Breakfast; Dispense 1 Box. 8)  Freestyle System  Kit (Blood Glucose Monitoring Suppl) .... Check Sugars Daily Before Breakfast 9)  Freestyle Lite Test  Strp (Glucose Blood) .... Test Sugar Daily Before Breakfast; Dispense 100 Strips (1 Box) 10)  Mobic 7.5 Mg Tabs (Meloxicam) .... One By Mouth Daily For Pain 11)  Chantix Continuing Month Pak 1 Mg Tabs (Varenicline Tartrate) .... One By Mouth Two Times A Day As Directed; Disp 1 Pack  Allergies (verified): No Known Drug Allergies  Social History: Moved back here from [place] approx 6 yrs ago.  Lives with her husband Rich Fuchs X 9 yrs.  Has 3 grown sons.  Smokes 1/2 PPD X 27 yrs.  No EtOH.  No drug use.  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 Packs/Day:  0.5  Review of Systems       review of systems as noted in HPI section   Physical Exam  General:  vital signs reviewed and normal Alert, appropriate; well-dressed  and well-nourished  Mouth:  oropharynx pink, moist; no erythema or exudate ; looks well-hydrated Lungs:  work of breathing unlabored, clear to auscultation bilaterally; no wheezes, rales, or ronchi; good air movement throughout  Heart:  regular rate and rhythm, no murmurs; normal s1/s2  Skin:  warm, good turgor; no rashes or lesions. brisk cap refill   Diabetes Management Exam:    Foot Exam (with socks and/or shoes not present):       Sensory-Pinprick/Light touch:          Left medial foot (L-4): normal          Left dorsal foot (L-5): normal          Left lateral foot (S-1): normal          Right medial foot (L-4): normal          Right dorsal foot (L-5): normal          Right lateral foot (S-1): normal       Sensory-Monofilament:          Left foot: normal          Right  foot: normal       Inspection:          Left foot: normal          Right foot: normal       Nails:          Left foot: normal          Right foot: normal   Impression & Recommendations:  Problem # 1:  DIABETES MELLITUS, TYPE II (ICD-250.00) Assessment Deteriorated Pt has essentially ignored this issue and A1c is greatly deteriorated. Has demonstrated good glycemic control with A1c of 6.9 as recently as 3/10.  Corrected the pt's misunderstanding about not being able to get refills without an appointment. Did my best to frankly discuss the gravity of failing to attend to this issue and the health consequences that would almost certainly ensue. Marland Kitchen Pt expressed understanding. Agrees to come back in 2 weeks. Restart medicines and two times a day glucose checks. Gave pt glucose log and educational material regarding her diabetes. Plan to get labs at next visit.   Her updated medication list for this problem includes:    Aspirin Childrens 81 Mg Chew (Aspirin) .Marland Kitchen... Take 1 tablet by mouth once a day    Metformin Hcl 1000 Mg Tabs (Metformin hcl) .Marland Kitchen... Take 1 tablet by mouth two times a day    Glipizide 10 Mg Tabs (Glipizide) .Marland Kitchen... 1 by mouth two times daily    Lisinopril 10 Mg Tabs (Lisinopril) ..... One by mouth daily  Orders: A1C-[facility] (98119)  Labs Reviewed: Creat: 0.67 (05/08/2009)    Reviewed HgBA1c results: 11.0 (05/01/2009)  6.9 (09/19/2008)  Problem # 2:  TOBACCO DEPENDENCE (ICD-305.1) Assessment: Unchanged  restart chantix. Pt would like to quit.  Her updated medication list for this problem includes:    Chantix Starting Month Pak 0.5 Mg X 11 & 1 Mg X 42 Tabs (Varenicline tartrate) .Marland Kitchen... Take per package instructions    Chantix Continuing Month Pak 1 Mg Tabs (Varenicline tartrate) ..... One by mouth two times a day as directed; disp 1 pack  Orders: [facility]- Est  Level 4 (14782)  Complete Medication List: 1)  Aspirin Childrens 81 Mg Chew (Aspirin) .... Take 1 tablet by mouth once a  day 2)  Simvastatin 40 Mg Tabs (Simvastatin) .... Take one tablet at bedtime 3)  Metformin Hcl 1000 Mg Tabs (  Metformin hcl) .... Take 1 tablet by mouth two times a day 4)  Glipizide 10 Mg Tabs (Glipizide) .Marland Kitchen.. 1 by mouth two times daily 5)  Lisinopril 10 Mg Tabs (Lisinopril) .... One by mouth daily 6)  Chantix Starting Month Pak 0.5 Mg X 11 & 1 Mg X 42 Tabs (Varenicline tartrate) .... Take per package instructions 7)  Freestyle Lancets Misc (Lancets) .... Use daily before breakfast; dispense 1 box. 8)  Freestyle System Kit (Blood glucose monitoring suppl) .... Check sugars daily before breakfast 9)  Freestyle Lite Test Strp (Glucose blood) .... Test sugar daily before breakfast; dispense 100 strips (1 box) 10)  Mobic 7.5 Mg Tabs (Meloxicam) .... One by mouth daily for pain 11)  Chantix Continuing Month Pak 1 Mg Tabs (Varenicline tartrate) .... One by mouth two times a day as directed; disp 1 pack  Patient Instructions: 1)  restart you medicines. 2)  Please start checking your sugars two times a day (when you wake up and before bed).  3)  Please call me if you have any problems getting your medicines, your sugar testing supplies, or other issues. 4)  Please follow-up with me in 2 weeks. 5)  If we don't get your diabetes under better control, I'm worried that you're going to have a bad complication like a stroke, heart attack, or kidney failure.  Prescriptions: CHANTIX CONTINUING MONTH PAK 1 MG TABS (VARENICLINE TARTRATE) one by mouth two times a day as directed; disp 1 pack  #1 x 0   Entered and Authorized by:   Myrtie Soman  MD   Signed by:   Myrtie Soman  MD on 09/30/2009   Method used:   Electronically to        [facility] [address] (retail)       [address], [address]       [place], [place]  [address]       Ph: [phone]       Fax: [phone]   RxID:   [id number] CHANTIX STARTING MONTH PAK 0.5 MG X 11 & 1 MG X 42 TABS (VARENICLINE  TARTRATE) take per package instructions  #1 x 0   Entered and Authorized by:   Myrtie Soman  MD   Signed by:   Myrtie Soman  MD on 09/30/2009   Method used:   Electronically to        [facility] [address] (retail)       [address], [address]       [place], [place]  [address]       Ph: [phone]       Fax: [phone]   RxID:   [id number] FREESTYLE LITE TEST  STRP (GLUCOSE BLOOD) test sugar daily before breakfast; dispense 100 strips (1 box)  #1 x 11   Entered and Authorized by:   Myrtie Soman  MD   Signed by:   Myrtie Soman  MD on 09/30/2009   Method used:   Electronically to        [facility] [address] (retail)       [address], [address]       [place], [place]  [address]       Ph: [phone]       Fax: [phone]   RxID:   [id number] FREESTYLE SYSTEM  KIT (  BLOOD GLUCOSE MONITORING SUPPL) check sugars daily before breakfast  #1 x 0   Entered and Authorized by:   Myrtie Soman  MD   Signed by:   Myrtie Soman  MD on 09/30/2009   Method used:   Electronically to        [facility] [address] (retail)       [address], [address]       [place], [place]  [address]       Ph: [phone]       Fax: [phone]   RxID:   [id number] FREESTYLE LANCETS  MISC (LANCETS) use daily before breakfast; dispense 1 box.  #1 x 11   Entered and Authorized by:   Myrtie Soman  MD   Signed by:   Myrtie Soman  MD on 09/30/2009   Method used:   Electronically to        [facility] [address] (retail)       [address], [address]       [place], [place]  [address]       Ph: [phone]       Fax: [phone]   RxID:   [id number] LISINOPRIL 10 MG TABS (LISINOPRIL) one by mouth daily  #90 x 1   Entered and Authorized by:   Myrtie Soman  MD   Signed by:   Myrtie Soman  MD on 09/30/2009   Method used:   Electronically to         [facility] [address] (retail)       [address], [address]       [place], [place]  [address]       Ph: [phone]       Fax: [phone]   RxID:   [id number] GLIPIZIDE 10 MG  TABS (GLIPIZIDE) 1 by mouth two times daily  #180 x 1   Entered and Authorized by:   Myrtie Soman  MD   Signed by:   Myrtie Soman  MD on 09/30/2009   Method used:   Electronically to        [facility] [address] (retail)       [address], [address]       [place], [place]  [address]       Ph: [phone]       Fax: [phone]   RxID:   [id number] METFORMIN HCL 1000 MG TABS (METFORMIN HCL) Take 1 tablet by mouth two times a day  #180 x 1   Entered and Authorized by:   Myrtie Soman  MD   Signed by:   Myrtie Soman  MD on 09/30/2009   Method used:   Electronically to        [facility] [address] (retail)       [address], [address]       [place], [place]  [address]       Ph: [phone]       Fax: [phone]   RxID:   [id number] SIMVASTATIN 40 MG TABS (SIMVASTATIN) Take one tablet at bedtime  #90 x 1   Entered and Authorized by:   Myrtie Soman  MD   Signed by:   Myrtie Soman  MD  on 09/30/2009   Method used:   Electronically to        [facility] [address] (retail)       [address], [address]       [place], [place]  [address]       Ph: [phone]       Fax: [phone]   RxID:   [id number] ASPIRIN CHILDRENS 81 MG CHEW (ASPIRIN) Take 1 tablet by mouth once a day  #34 x 11   Entered and Authorized by:   Myrtie Soman  MD   Signed by:   Myrtie Soman  MD on 09/30/2009   Method used:   Electronically to        [facility] [address] (retail)       [address], [address]       [place], [place]  [address]       Ph: [phone]       Fax: [phone]   RxID:   [id number]   Laboratory Results     Blood Tests   Date/Time Received: September 30, 2009 3:00 PM  Date/Time Reported: September 30, 2009 3:13 PM   HGBA1C: >14.0%   (Normal Range: Non-Diabetic - 3-6%   Control Diabetic - 6-8%)  Comments: ...............test performed by.................Marland KitchenGaren Grams, LPN .............entered by...........Marland KitchenBonnie A. Swazil[place], MLS (ASCP)cm      Prevention & Chronic Care Immunizations   Influenza vaccine: Fluvax Non-MCR  (05/01/2009)   Influenza vaccine due: 04/17/2008    Tetanus booster: 09/13/2004: Done.   Tetanus booster due: 09/14/2014    Pneumococcal vaccine: Not documented  Colorectal Screening   Hemoccult: Not documented    Colonoscopy: Not documented  Other Screening   Pap smear: Normal  (09/25/2008)   Pap smear due: 10/2011    Mammogram: ASSESSMENT: Negative - BI-RADS 1^MM DIGITAL SCREENING  (09/21/2009)   Mammogram due: 09/22/2010   Smoking status: current  (09/30/2009)   Smoking cessation counseling: yes  (10/09/2008)   Target quit date: 10/09/2008  (10/09/2008)  Diabetes Mellitus   HgbA1C: >14.0  (09/30/2009)   Hemoglobin A1C due: 05/03/2008    Eye exam: Not documented    Foot exam: yes  (09/30/2009)   High risk foot: Not documented   Foot care education: Not documented   Foot exam due: 04/17/2008    Urine microalbumin/creatinine ratio: Not documented    Diabetes flowsheet reviewed?: Yes   Progress toward A1C goal: Deteriorated  Lipids   Total Cholesterol: 169  (05/08/2009)   LDL: 116  (05/08/2009)   LDL Direct: 162  (04/18/2007)   HDL: 34  (05/08/2009)   Triglycerides: 96  (05/08/2009)    SGOT (AST): 7  (05/08/2009)   SGPT (ALT): <8 U/L  (05/08/2009)   Alkaline phosphatase: 88  (05/08/2009)   Total bilirubin: 0.2  (05/08/2009)    Lipid flowsheet reviewed?: Yes   Progress toward LDL goal: Unchanged  Hypertension   Last Blood Pressure: 134 / 82  (09/30/2009)   Serum creatinine: 0.67  (05/08/2009)   Serum potassium 4.2  (05/08/2009)     Hypertension flowsheet reviewed?: Yes   Progress toward BP goal: Unchanged  Self-Management Support :   Personal Goals (by the next clinic visit) :     Personal A1C goal: 7  (05/01/2009)     Personal blood pressure goal: 130/80  (05/01/2009)     Personal LDL goal: 70  (05/01/2009)  Diabetes self-management support: Copy of home glucose meter record, Written self-care plan, Education handout  (09/30/2009)   Diabetes care plan printed   Diabetes education handout printed    Hypertension self-management support: Written self-care plan  (09/30/2009)   Hypertension self-care plan printed.    Hypertension self-management support not done because: Not indicated  (09/30/2009)    Lipid self-management support: Written self-care plan  (09/30/2009)   Lipid self-care plan printed.    Lipid self-management support not done because: Not indicated  (09/30/2009)

## 2010-08-10 NOTE — Assessment & Plan Note (Signed)
Summary: f/u,df   Vital Signs:  Patient profile:   55 year old female Height:      64.5 inches Weight:      141 pounds BMI:     23.91 Temp:     98.6 degrees F oral Pulse rate:   82 / minute BP sitting:   123 / 86  (right arm) Cuff size:   regular  Vitals Entered By: Tessie Fass CMA (January 07, 2010 3:12 PM) CC: F/U Is Patient Diabetic? Yes Pain Assessment Patient in pain? yes     Location: neck and right shoulder Intensity: 5   Primary Care Provider:  Antoine Primas DO  CC:  F/U.  History of Present Illness: 55 yo female hee for neck pain and f/u on DM.    1.  DM-  Pt has been taking her medication more religously then before and has seen some improvement in blood sugars.  Pt has been cheernoon. checking about two times daily and has been in mid 200 in Am ;and then 300' in afyrtnoon.  Pt states when her blood sugar gets down near 120-140 which doesn't happen much she feels very fatigued and a little shaky.  Pt though understands how important it is for her to get her sugar under control and would be open to insulin.  Last A1C was 14 in 3/11.  t states she does watch her diet but does not exercise due to her neck and shoulder pain.   2.  Neck pain-  Pt states neck pain is b/l it is getting in the way of her daily activities.  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  pt states the pain is throbbing in nature and worse with repetitive movement tramadol does help well.   Pt is collecting workman's comp for the tramdoll medication.  Pt has had neck sugery and shoulder replacement before.  Pt states at this moment tramadol is still working and would like to avoid other medications if possible.   3.. Preventitive care.  Pt is 54 has not had a colonoscopy.   4.  Smoking cessation-  Pt does have chantix but haas not take it because she is not ready.  Pt is smoking 1/2 ppd Pt is trying to get the motivation to wuit just does not have it yet.  5.  Ovarian mass-  Pt states she had the u/s and would really like to avoid having it done again.  will call again.  Pt states she has not had much weight change or stomach pain.    Habits & Providers  Alcohol-Tobacco-Diet     Tobacco Status: current     Tobacco Counseling: to quit use of tobacco products     Cigarette Packs/Day: 0.5  Current Medications (verified): 1)  Aspirin Childrens 81 Mg Chew (Aspirin) .... Take 1 Tablet By Mouth Once A Day 2)  Simvastatin 40 Mg Tabs (Simvastatin) .... Take One Tablet At Bedtime 3)  Metformin Hcl 1000 Mg Tabs (Metformin Hcl) .... Take 1 Tablet By Mouth Two Times A Day 4)  Glipizide 10 Mg  Tabs (Glipizide) .Marland Kitchen.. 1 By Mouth Two Times Daily 5)  Lisinopril 10 Mg Tabs (Lisinopril) .... One By Mouth Daily 6)  Chantix Starting Month Pak 0.5 Mg X 11 & 1 Mg X 42 Tabs (Varenicline Tartrate) .... Take Per Package Instructions 7)  Freestyle Lancets  Misc (Lancets) .... Use Daily Before Breakfast; Dispense 1 Box. 8)  Freestyle System  Kit (Blood Glucose Monitoring Suppl) .... Check Sugars Daily Before Breakfast 9)  Freestyle Lite Test  Strp (Glucose Blood) .... Test Sugar Daily Before Breakfast; Dispense 100 Strips (1 Box) 10)  Chantix Continuing Month Pak 1 Mg Tabs (Varenicline Tartrate) .... One By Mouth Two Times A Day As Directed; Disp 1 Pack 11)  Tramadol Hcl 50 Mg Tabs (Tramadol Hcl) .Marland Kitchen.. 1-2 Tabs By Mouth Every 6 Hours As Needed 12)  Tylenol Arthritis Pain 650 Mg Cr-Tabs (Acetaminophen) .... One By Mouth Every 6 Hours As Needed  Allergies (verified): No Known Drug Allergies  Past History:  Past medical, surgical, family and social histories (including risk factors) reviewed, and no changes noted (except as noted below).  Past Medical History: Reviewed history from 12/21/2009 and no changes required. hemorrhoids Diabetes mellitus, type II Hyperlipidemia Hypertension  mammogram normal 3/10 ovarian mass on [place] 3/10 -- per report pt had gyn follow-up and  "everything was fine." but we do not have these records.   Past Surgical History: Reviewed history from 09/07/2006 and no changes required. btl `82 - 05/16/2005, C-section X 3;  `78,`80,`82 - 10/13/2003  Family History: Reviewed history from 09/07/2006 and no changes required. Brother w/ DM and HTN, Father deceased at 67 due to blood clot, Middle son w/ asthma, Mother alive at 53 w/ DM and arthritis and high chol., Oldest son w/ allergic rhinitis, Sister w/ DM  Social History: Reviewed history from 09/30/2009 and no changes required. Moved back here from [place] approx 6 yrs ago.  Lives with her husband Rich Fuchs X 9 yrs.  Has 3 grown sons.  Smokes 1/2 PPD X 27 yrs.  No EtOH.  No drug use.  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   Review of Systems       denies fever, chills, nausea, vomiting, diarrhea or constipation   Physical Exam  General:  vital signs reviewed and normal Alert, appropriate; well-dressed and well-nourished  Eyes:  PERRLA, EOMI Mouth:  oropharynx pink, moist; no erythema or exudate ; looks well-hydrated Neck:  Pt does have some decrease in rom, tender to palpation all over Lungs:  coarse breath sounds Abdomen:  NT, ND, Bs+ Msk:  pt does have multiple tenderpints in thoracic area  T4 RS r  Pulses:  2+ peripheral Extremities:  no edema, NVI Neurologic:  2-12 intact   Impression & Recommendations:  Problem # 1:  DIABETES MELLITUS, TYPE II (ICD-250.00) Assessment Improved Will get A1C.  Will start insulin but pt needs teeaching, pt sent to pharmacy clinic where it can be prescribed under my name but where pt can get teaching.  Did not feel sending pt home with minimal teaching was a good idea.  Pt declined DM classes or nutritionist.  Would like pt to come back 1 week after she start insulin to see how she is doing.  Her updated medication list for this problem includes:    Aspirin Childrens 81 Mg Chew (Aspirin) .Marland Kitchen... Take  1 tablet by mouth once a day    Metformin Hcl 1000 Mg Tabs (Metformin hcl) .Marland Kitchen... Take 1 tablet by mouth two times a day    Glipizide 10 Mg Tabs (Glipizide) .Marland Kitchen... 1 by mouth two times daily    Lisinopril 10 Mg Tabs (Lisinopril) ..... One by mouth daily  Problem # 2:  NECK PAIN (ICD-723.1) Assessment: Unchanged manipulated today with some improvement pt will continue trmadol.  Pt may need neurontin for pain as  next step, would like to avoid narcotics in this pt as long as possible.  Manipulation as needed, and no need for imaging at this time. Her updated medication list for this problem includes:    Aspirin Childrens 81 Mg Chew (Aspirin) .Marland Kitchen... Take 1 tablet by mouth once a day    Tramadol Hcl 50 Mg Tabs (Tramadol hcl) .Marland Kitchen... 1-2 tabs by mouth every 6 hours as needed    Tylenol Arthritis Pain 650 Mg Cr-tabs (Acetaminophen) ..... One by mouth every 6 hours as needed  Problem # 3:  SPECIAL SCREENING FOR MALIGNANT NEOPLASMS COLON (ICD-V76.51) Will get colonoscopy, gave pt numbers to call to get appt.    Problem # 4:  OVARIAN MASS (ICD-625.8) Pt states that she did have an u/s and everything seemed to be normal will try to call again for records.    Complete Medication List: 1)  Aspirin Childrens 81 Mg Chew (Aspirin) .... Take 1 tablet by mouth once a day 2)  Simvastatin 40 Mg Tabs (Simvastatin) .... Take one tablet at bedtime 3)  Metformin Hcl 1000 Mg Tabs (Metformin hcl) .... Take 1 tablet by mouth two times a day 4)  Glipizide 10 Mg Tabs (Glipizide) .Marland Kitchen.. 1 by mouth two times daily 5)  Lisinopril 10 Mg Tabs (Lisinopril) .... One by mouth daily 6)  Chantix Starting Month Pak 0.5 Mg X 11 & 1 Mg X 42 Tabs (Varenicline tartrate) .... Take per package instructions 7)  Freestyle Lancets Misc (Lancets) .... Use daily before breakfast; dispense 1 box. 8)  Freestyle System Kit (Blood glucose monitoring suppl) .... Check sugars daily before breakfast 9)  Freestyle Lite Test Strp (Glucose blood) ....  Test sugar daily before breakfast; dispense 100 strips (1 box) 10)  Chantix Continuing Month Pak 1 Mg Tabs (Varenicline tartrate) .... One by mouth two times a day as directed; disp 1 pack 11)  Tramadol Hcl 50 Mg Tabs (Tramadol hcl) .Marland Kitchen.. 1-2 tabs by mouth every 6 hours as needed 12)  Tylenol Arthritis Pain 650 Mg Cr-tabs (Acetaminophen) .... One by mouth every 6 hours as needed  Other Orders: A1C-[facility] (16109) UA Microalbumin-[facility] (60454)   Patient Instructions: 1)  Very nice to meet you 2)  Today we will draw some labs, check your A1C, and collect urine to make sure diabetes has not affected your kidneys 3)  I want you to do the exercises I showed you daily 4)  I am sending you to phanrmacology clinic to be taught how to use inlsulin, we will start a medication called lantus.   5)  For your neck and shoulder pain, continue the tramdol two times a day I will give you another prescription  6)  I will send in referral for colonoscopy.  They will call you for an appointment. 7)  I want to se you again about 1 week after you start the lantus, try to keep a daily diary of your sugars  8)  You can always come back for more manipulation.     Prevention & Chronic Care Immunizations   Influenza vaccine: Fluvax Non-MCR  (05/01/2009)   Influenza vaccine due: 04/17/2008    Tetanus booster: 09/13/2004: Done.   Tetanus booster due: 09/14/2014    Pneumococcal vaccine: Not documented  Colorectal Screening   Hemoccult: Not documented    Colonoscopy: Not documented  Other Screening   Pap smear: Normal  (09/25/2008)   Pap smear due: 10/2011    Mammogram: ASSESSMENT: Negative - BI-RADS 1^MM DIGITAL SCREENING  (09/21/2009)  Mammogram due: 09/22/2010   Smoking status: current  (01/07/2010)   Smoking cessation counseling: yes  (10/09/2008)   Target quit date: 10/09/2008  (10/09/2008)  Diabetes Mellitus   HgbA1C: 11.5  (01/07/2010)   HgbA1C action/deferral: Ordered  (01/07/2010)   Hemoglobin  A1C due: 04/09/2010    Eye exam: Not documented    Foot exam: yes  (09/30/2009)   High risk foot: Not documented   Foot care education: Not documented   Foot exam due: 04/17/2008    Urine microalbumin/creatinine ratio: Not documented   Urine microalbumin action/deferral: Ordered   Urine microalbumin/cr due: 01/08/2011    Diabetes flowsheet reviewed?: Yes  Lipids   Total Cholesterol: 169  (05/08/2009)   LDL: 116  (05/08/2009)   LDL Direct: 162  (04/18/2007)   HDL: 34  (05/08/2009)   Triglycerides: 96  (05/08/2009)    SGOT (AST): 7  (05/08/2009)   SGPT (ALT): <8 U/L  (05/08/2009)   Alkaline phosphatase: 88  (05/08/2009)   Total bilirubin: 0.2  (05/08/2009)  Hypertension   Last Blood Pressure: 123 / 86  (01/07/2010)   Serum creatinine: 0.67  (05/08/2009)   Serum potassium 4.2  (05/08/2009)  Self-Management Support :   Personal Goals (by the next clinic visit) :     Personal A1C goal: 7  (05/01/2009)     Personal blood pressure goal: 130/80  (05/01/2009)     Personal LDL goal: 70  (05/01/2009)    Diabetes self-management support: Copy of home glucose meter record, Written self-care plan, Education handout  (09/30/2009)    Hypertension self-management support: Written self-care plan  (09/30/2009)    Hypertension self-management support not done because: Not indicated  (09/30/2009)    Lipid self-management support: Written self-care plan  (09/30/2009)     Lipid self-management support not done because: Not indicated  (09/30/2009)   Nursing Instructions: HgbA1C today (see order)    Laboratory Results   Urine Tests  Date/Time Received: January 07, 2010 3:57 PM  Date/Time Reported: January 07, 2010 4:51 PM   Microalbumin (urine): 10 mg/L Creatinine: 50mg /dL  A:C Ratio 16-109  abnormal Comments: ...............test performed by......Marland KitchenBonnie A. [place], MLS (ASCP)cm   Blood Tests   Date/Time Received: January 07, 2010 3:57 PM  Date/Time Reported: January 07, 2010  4:51 PM   HGBA1C: 11.5%   (Normal Range: Non-Diabetic - 3-6%   Control Diabetic - 6-8%)  Comments: ...............test performed by......Marland KitchenBonnie A. [place], MLS (ASCP)cm

## 2010-08-10 NOTE — Miscellaneous (Signed)
Summary: Orders Update  Clinical Lists Changes  Problems: Added new problem of ENCOUNTER FOR LONG-TERM USE OF OTHER MEDICATIONS (ICD-V58.69) Orders: Added new Test order of B12-[facility] [id number]) - Signed Added new Test order of CBC-[facility] (96295) - Signed  Ok per Dr. Katrinka Blazing

## 2010-08-10 NOTE — Assessment & Plan Note (Signed)
Summary: rt shoulder pain/bmc   Vital Signs:  Patient profile:   55 year old female Height:      64.5 inches Weight:      141.3 pounds BMI:     23.97 Temp:     98.6 degrees F oral Pulse rate:   96 / minute BP sitting:   125 / 86  (left arm) Cuff size:   regular  Vitals Entered By: Garen Grams LPN (March 11, 2010 3:23 PM) CC: rt shoulder pain Is Patient Diabetic? Yes Did you bring your meter with you today? Yes Pain Assessment Patient in pain? yes     Location: rt shoulder Intensity: 9   Primary Care Daiki Dicostanzo:  Antoine Primas DO  CC:  rt shoulder pain.  History of Present Illness: 55  yo female here for work in appt due to right  shoulder pain x 24  hrs.  Pt has hx of rotator cuff tear, s/p surgical repair about 18  mo ago.  Pt reports lifting about 15  pounds at work yesterday when she felt a 'pop'  and felt shooting pain from her shoulder down her arm.  No loss in weakness, no change in sensation.  Able to isolate pain to point tenderness around [facility] joint.  Overhead lifting makes it worse, has notbtried anything to make it better.  Habits & Providers  Alcohol-Tobacco-Diet     Tobacco Status: current     Tobacco Counseling: to quit use of tobacco products     Cigarette Packs/Day: 0.5     Year Started: 1982     Pack years: 41  Current Problems (verified): 1)  Rotator Cuff Injury, Right Shoulder  (ICD-726.10) 2)  Rotator Cuff Repair, Right, Hx of  (ICD-V45.89) 3)  Neck Pain  (ICD-723.1) 4)  Rotator Cuff Syndrome, Right  (ICD-726.10) 5)  Ovarian Mass  (ICD-625.8) 6)  Screening For Malignant Neoplasm of The Cervix  (ICD-V76.2) 7)  Irregular Menses  (ICD-626.4) 8)  Special Screening For Malignant Neoplasms Colon  (ICD-V76.51) 9)  Abdominal Pain, Lower  (ICD-789.09) 10)  Hypertension  (ICD-401.9) 11)  Diabetes Mellitus, Type II  (ICD-250.00) 12)  Vaccine Against Influenza  (ICD-V04.81) 13)  Hyperlipidemia  (ICD-272.2) 14)  Tobacco Dependence  (ICD-305.1) 15)   Rhinitis, Allergic  (ICD-477.9) 16)  Obesity, Nos  (ICD-278.00) 17)  Hypertension, Benign Systemic  (ICD-401.1) 18)  Gastroesophageal Reflux, No Esophagitis  (ICD-530.81) 19)  Diabetes Mellitus II, Uncomplicated  (ICD-250.00) 20)  Back Pain, Low  (ICD-724.2) 21)  Asthma, Unspecified  (ICD-493.90)  Current Medications (verified): 1)  Aspirin Childrens 81 Mg Chew (Aspirin) .... Take 1 Tablet By Mouth Once A Day 2)  Simvastatin 40 Mg Tabs (Simvastatin) .... Take One Tablet At Bedtime 3)  Metformin Hcl 1000 Mg Tabs (Metformin Hcl) .... Take 1 Tablet By Mouth Two Times A Day 4)  Glipizide 10 Mg  Tabs (Glipizide) .Marland Kitchen.. 1 By Mouth Two Times Daily 5)  Lisinopril 10 Mg Tabs (Lisinopril) .... One By Mouth Daily 6)  Chantix Starting Month Pak 0.5 Mg X 11 & 1 Mg X 42 Tabs (Varenicline Tartrate) .... Take Per Package Instructions 7)  Freestyle Lancets  Misc (Lancets) .... Use Daily Before Breakfast; Dispense 1 Box. 8)  Freestyle System  Kit (Blood Glucose Monitoring Suppl) .... Check Sugars Daily Before Breakfast 9)  Freestyle Lite Test  Strp (Glucose Blood) .... Test Sugar Daily Before Breakfast; Dispense 100 Strips (1 Box) 10)  Chantix Continuing Month Pak 1 Mg Tabs (Varenicline Tartrate) .... One  By Mouth Two Times A Day As Directed; Disp 1 Pack 11)  Tramadol Hcl 50 Mg Tabs (Tramadol Hcl) .Marland Kitchen.. 1-2 Tabs By Mouth Every 6 Hours As Needed 12)  Tylenol Arthritis Pain 650 Mg Cr-Tabs (Acetaminophen) .... One By Mouth Every 6 Hours As Needed 13)  Mobic 15 Mg Tabs (Meloxicam) .... Take 1 Pill By Mouth Daily  Allergies (verified): No Known Drug Allergies  Past History:  Past Medical History: Last updated: 12/21/2009 hemorrhoids Diabetes mellitus, type II Hyperlipidemia Hypertension  mammogram normal 3/10 ovarian mass on [place] 3/10 -- per report pt had gyn follow-up and "everything was fine." but we do not have these records.   Past Surgical History: Last updated: 09/07/2006 btl `82 - 05/16/2005,  C-section X 3;  `78,`80,`82 - 10/13/2003  Family History: Last updated: 09/07/2006 Brother w/ DM and HTN, Father deceased at 9 due to blood clot, Middle son w/ asthma, Mother alive at 23 w/ DM and arthritis and high chol., Oldest son w/ allergic rhinitis, Sister w/ DM  Social History: Last updated: 09/30/2009 Moved back here from [place] approx 6 yrs ago.  Lives with her husband Rich Fuchs X 9 yrs.  Has 3 grown sons.  Smokes 1/2 PPD X 27 yrs.  No EtOH.  No drug use.  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   Risk Factors: Smoking Status: current (03/11/2010) Packs/Day: 0.5 (03/11/2010)  Social History: Reviewed history from 09/30/2009 and no changes required. Moved back here from [place] approx 6 yrs ago.  Lives with her husband Rich Fuchs X 9 yrs.  Has 3 grown sons.  Smokes 1/2 PPD X 27 yrs.  No EtOH.  No drug use.  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   Physical Exam  General:  vs reviewed, alert, well-developed, and well-nourished.     Shoulder/Elbow Exam  Skin:    right shoulderscar:   Inspection:    Inspection is normal.    Palpation:    tenderness R-AC:   Vascular:    Radial, ulnar, brachial, and axillary pulses 2+ and symmetric; capillary refill less than 2 seconds; no evidence of ischemia, clubbing, or cyanosis.    Sensory:    Gross sensation intact in the upper extremities.    Motor:    Normal strength in the upper extremities.    Reflexes:    Normal reflexes in the upper extremities.    Shoulder Exam:    Right:    Inspection:  Normal       Location:  right AC joint    Stability:  stable    Tenderness:  right AC joint    Swelling:  no    Erythema:  no    Left:    Inspection:  Normal    Palpation:  Normal    Stability:  stable    Tenderness:  no    decreased flexion on right, esp with overhead motion  Impingement Sign NEER:    Right negative; Left negative Impingement  Sign HAWKINS:    Left negative Apprehension Sign:    Right negative; Left negative Relocation Test:    Right negative; Left negative Sulcus Sign:    Right negative; Left negative AC joint Adduction Test:    Right positive; Left negative   Impression & Recommendations:  Problem # 1:  ROTATOR CUFF INJURY, RIGHT SHOULDER (ICD-726.10) Assessment New see pt instructions Orders: Orthopedic Surgeon Referral (Ortho Surgeon) [facility]- Est Level  [phone])  Complete Medication List: 1)  Aspirin Childrens 81 Mg Chew (Aspirin) .... Take 1 tablet by mouth once a day 2)  Simvastatin 40 Mg Tabs (Simvastatin) .... Take one tablet at bedtime 3)  Metformin Hcl 1000 Mg Tabs (Metformin hcl) .... Take 1 tablet by mouth two times a day 4)  Glipizide 10 Mg Tabs (Glipizide) .Marland Kitchen.. 1 by mouth two times daily 5)  Lisinopril 10 Mg Tabs (Lisinopril) .... One by mouth daily 6)  Chantix Starting Month Pak 0.5 Mg X 11 & 1 Mg X 42 Tabs (Varenicline tartrate) .... Take per package instructions 7)  Freestyle Lancets Misc (Lancets) .... Use daily before breakfast; dispense 1 box. 8)  Freestyle System Kit (Blood glucose monitoring suppl) .... Check sugars daily before breakfast 9)  Freestyle Lite Test Strp (Glucose blood) .... Test sugar daily before breakfast; dispense 100 strips (1 box) 10)  Chantix Continuing Month Pak 1 Mg Tabs (Varenicline tartrate) .... One by mouth two times a day as directed; disp 1 pack 11)  Tramadol Hcl 50 Mg Tabs (Tramadol hcl) .Marland Kitchen.. 1-2 tabs by mouth every 6 hours as needed 12)  Tylenol Arthritis Pain 650 Mg Cr-tabs (Acetaminophen) .... One by mouth every 6 hours as needed 13)  Mobic 15 Mg Tabs (Meloxicam) .... Take 1 pill by mouth daily  Patient Instructions: 1)  Nice to meet you! 2)  We are going to make an appointment for you to see Dr. Turner Daniels ([facility]) 3)  Ice your shoulder 3 times a day for 15 minutes each time 4)  No heavy lifting until you see the Surgeon. 5)  Take Tramadol as  needed.  6)  I have sent a script for Mobic to [facility] Prescriptions: MOBIC 15 MG TABS (MELOXICAM) take 1 pill by mouth daily  #30 x 0   Entered and Authorized by:   Alvia Grove DO   Signed by:   Alvia Grove DO on 03/11/2010   Method used:   Electronically to        [facility] [address] (retail)       [address]       [place], [place]  [address]       Ph: [phone]       Fax: [phone]   RxID:   [id number]

## 2010-08-10 NOTE — Letter (Signed)
Summary: Generic Letter  [facility]  [address]   [place], [place] [address]   Phone: [phone]  Fax: [phone]    12/21/2009  [facility] PAYNE-ROBINSON [address] [place], [place]  [address]  Dear Ms. PAYNE-ROBINSON,  I tried to get in touch by phone but was unsuccessful. In reviewing your chart before Dr. Katrinka Blazing takes over your care (I am graduating at the end of this month), I saw that we made a referral to gynecology after an ultrasound we ordered in March 2010. I was hoping you could tell us whether or not you had actually seen the gynecologist. If not, we should make another referral. Please call our office to discuss this further. I hope you are well.   Sincerely,   Myrtie Soman  MD  Appended Document: Generic Letter mailed.

## 2010-08-10 NOTE — Progress Notes (Signed)
Summary: Rx Req  Phone Note Refill Request Call back at Home Phone [phone] Message from:  Patient  Refills Requested: Medication #1:  METFORMIN HCL 1000 MG TABS Take 1 tablet by mouth two times a day  Medication #2:  GLIPIZIDE 10 MG  TABS 1 by mouth two times daily  Medication #3:  LISINOPRIL 10 MG TABS one by mouth daily [facility] [address] [place]  Initial call taken by: Clydell Hakim,  February 11, 2010 10:20 AM    Prescriptions: LISINOPRIL 10 MG TABS (LISINOPRIL) one by mouth daily  #30 x 0   Entered by:   Jone Baseman CMA   Authorized by:   Antoine Primas DO   Signed by:   Jone Baseman CMA on 02/11/2010   Method used:   Electronically to        [facility] [address] (retail)       [address], [address]       [place], [place]  [address]       Ph: [phone]       Fax: [phone]   RxID:   [id number] GLIPIZIDE 10 MG  TABS (GLIPIZIDE) 1 by mouth two times daily  #60 x 0   Entered by:   Jone Baseman CMA   Authorized by:   Antoine Primas DO   Signed by:   Jone Baseman CMA on 02/11/2010   Method used:   Electronically to        [facility] [address] (retail)       [address], [address]       [place], [place]  [address]       Ph: [phone]       Fax: [phone]   RxID:   [id number] METFORMIN HCL 1000 MG TABS (METFORMIN HCL) Take 1 tablet by mouth two times a day  #60 x 0   Entered by:   Jone Baseman CMA   Authorized by:   Antoine Primas DO   Signed by:   Jone Baseman CMA on 02/11/2010   Method used:   Electronically to        [facility] [address] (retail)       [address], [address]       [place], [place]  [address]       Ph: [phone]       Fax: [phone]   RxID:   [id number]   Appended Document: Rx Req Sent note to Pharmacy that pt needs appt for additional refills

## 2010-11-24 ENCOUNTER — Encounter: Payer: Self-pay | Admitting: Family Medicine

## 2010-11-24 ENCOUNTER — Ambulatory Visit (INDEPENDENT_AMBULATORY_CARE_PROVIDER_SITE_OTHER): Payer: BC Managed Care – PPO | Admitting: Family Medicine

## 2010-11-24 DIAGNOSIS — N9489 Other specified conditions associated with female genital organs and menstrual cycle: Secondary | ICD-10-CM

## 2010-11-24 DIAGNOSIS — F4321 Adjustment disorder with depressed mood: Secondary | ICD-10-CM | POA: Insufficient documentation

## 2010-11-24 DIAGNOSIS — G4700 Insomnia, unspecified: Secondary | ICD-10-CM | POA: Insufficient documentation

## 2010-11-24 MED ORDER — ZOLPIDEM TARTRATE 5 MG PO TABS: 5.0000 mg | ORAL_TABLET | Freq: Every evening | ORAL | Status: DC | PRN

## 2010-11-24 MED ORDER — HYDROXYZINE HCL 25 MG PO TABS: 25.0000 mg | ORAL_TABLET | Freq: Three times a day (TID) | ORAL | Status: DC | PRN

## 2010-11-24 NOTE — Progress Notes (Signed)
  Subjective:    Patient ID: Robin [name], female    DOB: Jul 31, 1955, 55 y.o.   MRN: [id number]  HPI Pt is here for recent death in family and grief.  Pt last week on wednesday had her sister die in bed suddenly of a MI when talking in bed.  Did not have many health problems, was unexpected, they were very close.   On Friday sister in law who was sick died of complications related to diabetes. Pt best friend's husband died then yesterday of a MI.  Pt states it is really hard, being strong, but can't really talk to anyone because everyone is going through the same thing.  Pt states she is not eating well, is having trouble sleeping as well and states she would feel better if she would get good sleep.  Pt denies  Suicidal and Homicidal ideation     Pt though has an ovarian mass seen on ultrasound in March 2010 findings include.  Findings above are worrisome for a 10.0 x 4.2 x 5.1 cm mass in the right adnexa is separate from the uterus potentially involving the ovary. Differential diagnosis would include benign and neoplasm  some benign and malignant neoplasm. MRI may be helpful. Large 7.1 cm pedunculated fibroid is suspected in the right adnexa. The endometrial stripe was obscured. Uterine echotexture is  heterogeneous. Consider multiple fibroids or adenomyosis.   Review of Systems Denies fever, chills, nausea vomiting abdominal pain, dysuria, chest pain, shortness of breath dyspnea on exertion or numbness in extremities     Objective:   Physical Exam General Appearance:    Alert, cooperative, no distress, appears stated age, tearful  Head:    Normocephalic, without obvious abnormality, atraumatic  Eyes:    PERRL, conjunctiva/corneas clear, EOM's intact,  Nose:   Nares normal, septum midline, mucosa normal, no drainage    or sinus tenderness  Throat:   Lips, mucosa, and tongue normal; teeth and gums normal  Lungs:     Clear to auscultation bilaterally, respirations unlabored  Chest  Wall:    No tenderness or deformity   Heart:    Regular rate and rhythm, S1 and S2 normal, no murmur, rub   or gallop  Abdomen:     Soft, non-tender, bowel sounds active all four quadrants,    no masses, no organomegaly  Extremities:   Extremities normal, atraumatic, no cyanosis or edema  Pulses:   2+ and symmetric all extremities          Assessment & Plan:

## 2010-11-24 NOTE — Patient Instructions (Signed)
I am so sorry for you loss.  If you need anything please do not hesitate to call.  I am writing you a letter to stay out of work the next week.  I want to see you again at then end of next week I am giving you a medicine for sleep called Remus Loffler I am giving you a medicine for anxiety called hydroxyzine as needed I want to see you again at the end of next week.

## 2010-11-24 NOTE — Assessment & Plan Note (Addendum)
Multiple family deaths in last week feel pt will need a lot of support. Pt declines SSRI at moment denies  Suicidal and Homicidal ideation  Will give hydroxyizine for anxiety.  Returning in 1 week.

## 2010-11-24 NOTE — Assessment & Plan Note (Signed)
Pt likely beginning of depression but does not want SSRI will give ambien.

## 2010-11-24 NOTE — Assessment & Plan Note (Addendum)
Pt has not had this followed up since diagnosis, no true red flags only 6# less since last visit.  Will get CT abd/pelvis for evaluation, if still vascularized and changed size then will send to gyn onc. Pt had ultrasound in 09/2008 showed vascular mass, no follow up since then, I was not the PCP until recently and found this information.  Will get CT scan soon.  Pt though does have a lot emotional problems at this time.

## 2010-11-26 ENCOUNTER — Ambulatory Visit (HOSPITAL_COMMUNITY)
Admission: RE | Admit: 2010-11-26 | Discharge: 2010-11-26 | Disposition: A | Discharge status: Home / Self Care | Payer: BC Managed Care – PPO | Source: Ambulatory Visit | Attending: Family Medicine | Admitting: Family Medicine

## 2010-11-26 DIAGNOSIS — D259 Leiomyoma of uterus, unspecified: Secondary | ICD-10-CM | POA: Insufficient documentation

## 2010-11-26 DIAGNOSIS — N9489 Other specified conditions associated with female genital organs and menstrual cycle: Secondary | ICD-10-CM

## 2010-11-26 DIAGNOSIS — D3500 Benign neoplasm of unspecified adrenal gland: Secondary | ICD-10-CM | POA: Insufficient documentation

## 2010-11-26 MED ORDER — IOHEXOL 300 MG/ML  SOLN
100.0000 mL | Freq: Once | INTRAMUSCULAR | Status: AC | PRN
  Administered 2010-11-26: 100 mL via INTRAVENOUS

## 2010-12-01 ENCOUNTER — Telehealth: Payer: Self-pay | Admitting: Family Medicine

## 2010-12-01 NOTE — Telephone Encounter (Signed)
Called pt to tell her that the mass is not on her ovary and does not need to be concern. Told her to call if we have any question.

## 2010-12-03 ENCOUNTER — Ambulatory Visit (INDEPENDENT_AMBULATORY_CARE_PROVIDER_SITE_OTHER): Payer: BC Managed Care – PPO | Admitting: Family Medicine

## 2010-12-03 ENCOUNTER — Encounter: Payer: Self-pay | Admitting: Family Medicine

## 2010-12-03 ENCOUNTER — Other Ambulatory Visit: Payer: Self-pay | Admitting: Family Medicine

## 2010-12-03 VITALS — BP 120/78 | HR 96 | Temp 98.5°F | Ht 64.0 in | Wt 134.2 lb

## 2010-12-03 DIAGNOSIS — E119 Type 2 diabetes mellitus without complications: Secondary | ICD-10-CM

## 2010-12-03 DIAGNOSIS — I10 Essential (primary) hypertension: Secondary | ICD-10-CM

## 2010-12-03 DIAGNOSIS — E782 Mixed hyperlipidemia: Secondary | ICD-10-CM

## 2010-12-03 LAB — LDL CHOLESTEROL, DIRECT: Direct LDL: 181 mg/dL — ABNORMAL HIGH

## 2010-12-03 NOTE — Progress Notes (Signed)
  Subjective:    Patient ID: Robin [name], female    DOB: 09-09-1955, 55 y.o.   MRN: [id number]  HPI  Pt is here for recent death in family and grief follow up.  Pt had three people vary close to her die. Pt also taking care of her mother which is a lot of stress.  Pt states though with increasing her sleep now with the ambien and with keeping herself busy during the day she feels a little better.  Pt states she is able to do her activities of daily living without difficulty.  Only get tearful when talking about family.  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    Diabetes:  High at home:not checking Low at home:not checking Taking medications:no Side effects:pt just did not get it filled but now is motivated to change.  ROS: denies fever, chills, dizziness, loss of conscieness,  Positive for polyuria poly dipsia and fatigue  Cholesterol-  Have not had it checked in 1 year, on simvistatin but has not taken it for 2 months. States was mostly due to dealing with other things.   Review of Systems  Denies fever, chills, nausea vomiting abdominal pain, dysuria, chest pain, shortness of breath dyspnea on exertion or numbness in extremities     Objective:   Physical Exam  General Appearance:    Alert, cooperative, no distress, appears stated age, tearful  Head:    Normocephalic, without obvious abnormality, atraumatic  Eyes:    PERRL, conjunctiva/corneas clear, EOM's intact,  Nose:   Nares normal, septum midline, mucosa normal, no drainage    or sinus tenderness  Throat:   Lips, mucosa, and tongue normal; teeth and gums normal  Lungs:     Clear to auscultation bilaterally, respirations unlabored  Chest Wall:    No tenderness or deformity   Heart:    Regular rate and rhythm, S1 and S2 normal, no murmur, rub   or gallop  Abdomen:     Soft, non-tender, bowel sounds active all four quadrants,    no  masses, no organomegaly  Extremities:   Extremities normal, atraumatic, no cyanosis or edema  Pulses:   2+ and symmetric all extremities          Assessment & Plan:

## 2010-12-03 NOTE — Assessment & Plan Note (Signed)
A1c 13.8, discussed at length the importance of keeping track of sugars and the long term effects of sugar, pt appeared more motivated to improve her life and compliance.   Will have pt come back in 2 weeks if sugars are out of control or two months to check A1c again to make sure we are making strides. Will get BMET today.

## 2010-12-03 NOTE — Assessment & Plan Note (Signed)
NOT a mass will resolve problem,

## 2010-12-03 NOTE — Assessment & Plan Note (Signed)
Will check ldl 

## 2010-12-03 NOTE — Patient Instructions (Signed)
I am glad you are doing well If you need anything please give me a call Controlling your blood sugar is going to be the most important thing you can do. I want to see you again in 2 months.

## 2010-12-04 LAB — BASIC METABOLIC PANEL
CO2: 22 mEq/L (ref 19–32)
Calcium: 9.2 mg/dL (ref 8.4–10.5)
Creat: 0.83 mg/dL (ref 0.40–1.20)
Sodium: 135 mEq/L (ref 135–145)

## 2010-12-08 ENCOUNTER — Telehealth: Payer: Self-pay | Admitting: Family Medicine

## 2010-12-08 NOTE — Telephone Encounter (Signed)
Called pt talked to her mother told her that labs were not great but pt was not taking her medicines and was expected, wanted to make sure she was doing better which mom states she is.  Told her to call if she had any questions

## 2010-12-08 NOTE — Telephone Encounter (Signed)
Robin [name] returned your call, but don't think she was given the right message.  She was told by her mom that everything was all right.  Did not give her the msg left in chart.  Told her I will let you know she called.  May want to call her back to give her direct info

## 2010-12-13 NOTE — Telephone Encounter (Signed)
Attempted to call multiple times without an answer, if need anything will await pt call back.

## 2010-12-14 ENCOUNTER — Telehealth: Payer: Self-pay | Admitting: Family Medicine

## 2010-12-14 DIAGNOSIS — I10 Essential (primary) hypertension: Secondary | ICD-10-CM

## 2010-12-14 MED ORDER — GLIPIZIDE 10 MG PO TABS: 10.0000 mg | ORAL_TABLET | Freq: Two times a day (BID) | ORAL | Status: DC

## 2010-12-14 MED ORDER — LISINOPRIL 10 MG PO TABS: 10.0000 mg | ORAL_TABLET | Freq: Every day | ORAL | Status: DC

## 2010-12-14 MED ORDER — METFORMIN HCL 1000 MG PO TABS: 1000.0000 mg | ORAL_TABLET | Freq: Two times a day (BID) | ORAL | Status: DC

## 2010-12-14 NOTE — Telephone Encounter (Signed)
Called patient to tell her that the RX she requested have been sent in.  States her vision is doubled   and her BS now is 482. Has been without meds for 2 months . Advised that she should go to Urgent Care now, ( have someone take her ) to get help with elevated Blood Sugar urgently and let them know that she has been off her meds for 2 months.

## 2010-12-14 NOTE — Telephone Encounter (Signed)
Ms. Haacke need to have 3 refills sent to [facility] on [address] in [place].  They are:  Glipizide, Metformin and Lisinopril.  She has been waiting for 2 wks for refills.  Have contacted pharmacy both weeks to see if request had been completed.  Informed they had heard anything. She even had them to fax to my attention, but did not receive fax.  Will check with pharmacy in about an hr or so.

## 2010-12-14 NOTE — Telephone Encounter (Signed)
Agree with plan, have sent in prescriptions twice already.  Do have trouble with this pharmacy frequently.

## 2010-12-16 ENCOUNTER — Ambulatory Visit (INDEPENDENT_AMBULATORY_CARE_PROVIDER_SITE_OTHER): Payer: BC Managed Care – PPO | Admitting: Family Medicine

## 2010-12-16 ENCOUNTER — Telehealth: Payer: Self-pay | Admitting: Family Medicine

## 2010-12-16 ENCOUNTER — Encounter: Payer: Self-pay | Admitting: Family Medicine

## 2010-12-16 ENCOUNTER — Ambulatory Visit
Admission: RE | Admit: 2010-12-16 | Discharge: 2010-12-16 | Disposition: A | Discharge status: Home / Self Care | Payer: BC Managed Care – PPO | Source: Ambulatory Visit | Attending: Family Medicine | Admitting: Family Medicine

## 2010-12-16 VITALS — BP 136/82 | HR 74 | Wt 134.0 lb

## 2010-12-16 DIAGNOSIS — H532 Diplopia: Secondary | ICD-10-CM

## 2010-12-16 LAB — TSH: TSH: 0.422 u[IU]/mL (ref 0.350–4.500)

## 2010-12-16 MED ORDER — GADOBENATE DIMEGLUMINE 529 MG/ML IV SOLN
12.0000 mL | Freq: Once | INTRAVENOUS | Status: AC | PRN
  Administered 2010-12-16: 12 mL via INTRAVENOUS

## 2010-12-16 NOTE — Progress Notes (Signed)
  Subjective:    Patient ID: Robin [name], female    DOB: [birth year], 54 y.o.   MRN: [id number]  HPI   Diploplia x 2 days, pt was watching sons walk down the street when acutely her vision changed, the double vision has been persistent since then, she has also noticed some blurry vision. She is able to see okay if she covers one eye at a time. Her peripheral vision is okay. No previous history of vision problems. Vision does not change with movement of head. VIsion has not changed past 2 days, unable to complete her job this morning Note she has poorly controlled Diabetes with A1C of 13.8% she restarted all of her meds within the past week, as she has been out for 2 months per notes   Review of Systems  No HA, no focal weakness, decreased sensation on feet, no fever, no recent illness, no eye discharge, no trauma to eye, no CP, no change in speech     Objective:   Physical Exam GEN- NAD, alert and oriented  Vision screen noted HEENT- PERRL, no nystagmus, EOMI, fundoscopic exam benign on right eye, unable to visualize disc on left side, oropharynx clear CVS- RRR RESP-CTAB Neuro- normal reflexes bilat, decreased sensation on soles of feet bilat, no focal deficits,normal speech, no facial droop, CN II-XII grossly in tact, gait normal        Assessment & Plan:    Diplopia/vision Changes- concern for new onset vision changes in poorly controlled diabetic, hypertension, poorly controlled hyperlipidemia possiblity of CVA event. She does have an eye doctor appt this afternoon. Which I encouraged her to go to. Will schedule an MRI, she should continue her other medications she has recently restarted No evidence of trauma or infection Will check a TSH to r/u hyperthyroidism but low on differential Will need follow-up next week

## 2010-12-16 NOTE — Patient Instructions (Signed)
I am sending you for an MRI of your brain- I am concerned about your vision and your poorly controlled diabetes and cholesterol, HbA1C= 13.5%  Your LDL was 181 I will also check your thyroid level  Continue your current medications  If you have any change in your vision (gets worse), of if you develop new symptoms such as chest pain, nausea/vomiting, weakness on one side more than the other then go to the ER I will call you with your results.

## 2010-12-16 NOTE — Telephone Encounter (Signed)
I spoke with pt, given MRI results- normal States her eye exam was also normal- no diabetic retinopathy- office visit notes will be sent over With normal work-up this is likley result of her diabtic state, she will need insulin to improve her diabetes in the short term especially with the new changes. I have sent her visit note to her PCP, she will make a follow-up appt next week Will f/u with Optho in 1week as well.

## 2011-01-03 ENCOUNTER — Ambulatory Visit: Payer: BC Managed Care – PPO | Admitting: Family Medicine

## 2011-01-05 ENCOUNTER — Ambulatory Visit (INDEPENDENT_AMBULATORY_CARE_PROVIDER_SITE_OTHER): Payer: BC Managed Care – PPO | Admitting: Family Medicine

## 2011-01-05 ENCOUNTER — Encounter: Payer: Self-pay | Admitting: Family Medicine

## 2011-01-05 VITALS — BP 155/83 | HR 99 | Temp 97.9°F | Ht 64.0 in | Wt 133.7 lb

## 2011-01-05 DIAGNOSIS — E119 Type 2 diabetes mellitus without complications: Secondary | ICD-10-CM

## 2011-01-05 DIAGNOSIS — I10 Essential (primary) hypertension: Secondary | ICD-10-CM

## 2011-01-05 DIAGNOSIS — F17200 Nicotine dependence, unspecified, uncomplicated: Secondary | ICD-10-CM

## 2011-01-05 LAB — GLUCOSE, CAPILLARY: Glucose-Capillary: 153 mg/dL — ABNORMAL HIGH (ref 70–99)

## 2011-01-05 NOTE — Assessment & Plan Note (Signed)
Will get strips, today in 150's no signs of fatigue or going too low, pt in process of changing diet handout given.  Pt seems more motivated then usual.  Will see again in 6 weeks for bp check.

## 2011-01-05 NOTE — Progress Notes (Signed)
  Subjective:    Patient ID: Robin [name], female    DOB: [birth year], 55 y.o.   MRN: [id number]  HPI Pt is here for f/u of  1. Hypertension Blood pressure at home:140-150 SBP Blood pressure today: 155/83, 148.78 on recheck Taking Meds:started lisinopril again about 4 days ago Side effects:no ROS: Denies headache visual changes nausea, vomiting, chest pain or abdominal pain or shortness of breath.  2. Diabetes:  High at home:not checking sue to not being able to afford strips at this time. Pt though states she would like to still use same meter.  Low at home::"" Taking medications:metformin and glipzide Side effects:no ROS: denies fever, chills, dizziness, loss of conscieness, polyuria poly dipsia numbness or tingling in extremities or chest pain.  3.  Smoking"  Pt is still smoking wants to stop have chantix but not quit ready states due to the things that have happen to her life recently she does not feel that she could at this time.   4.  Grief-  Pt has had many deaths to people she care about recently but overall doing better, pt states she has a good support system does not want to talk to anyone really and does not want any medication.  Pt denies  Suicidal and Homicidal ideation   5.  Diplopia-  Non more since getting sugars under control.  Still has some blurred vision from time to time but nothing alarming anymore.   Review of Systems Denies fever, chills, nausea vomiting abdominal pain, dysuria, chest pain, shortness of breath dyspnea on exertion or numbness in extremities    Past medical history, social, surgical and family history all reviewed.   Objective:   Physical Exam General Appearance:    Alert, cooperative, no distress, appears stated age, tearful  Head:    Normocephalic, without obvious abnormality, atraumatic  Eyes:    PERRL, conjunctiva/corneas clear, EOM's intact,  Nose:   Nares normal, septum midline, mucosa normal, no drainage    or sinus  tenderness  Throat:   Lips, mucosa, and tongue normal; teeth and gums normal  Lungs:     Clear to auscultation bilaterally, respirations unlabored  Chest Wall:    No tenderness or deformity   Heart:    Regular rate and rhythm, S1 and S2 normal, no murmur, rub   or gallop  Abdomen:     Soft, non-tender, bowel sounds active all four quadrants,    no masses, no organomegaly  Extremities:   Extremities normal, atraumatic, no cyanosis or edema  Pulses:   2+ and symmetric all extremities         Assessment & Plan:

## 2011-01-05 NOTE — Patient Instructions (Signed)
Good to see you Get the strips to check your blood sugar twice a week I want you to add more fruits and vegetables in your diet, and exercise more.   I want to see you back in 3-4 weeks to check your blood sugars and blood pressure again.

## 2011-01-05 NOTE — Assessment & Plan Note (Signed)
Still smoking will address at follow up

## 2011-01-05 NOTE — Assessment & Plan Note (Addendum)
Still not at goal at this time, but pt states she can improve with diet nd exercise, will be back in 6 weeks to see how it does. Would increase lisinopril if possible or add HCTZ next.

## 2011-01-25 ENCOUNTER — Ambulatory Visit: Payer: BC Managed Care – PPO | Admitting: Family Medicine

## 2011-02-14 ENCOUNTER — Encounter: Payer: Self-pay | Admitting: Family Medicine

## 2011-02-14 NOTE — Telephone Encounter (Signed)
This encounter was created in error - please disregard.

## 2011-02-22 ENCOUNTER — Ambulatory Visit: Payer: BC Managed Care – PPO | Admitting: Family Medicine

## 2011-04-18 ENCOUNTER — Other Ambulatory Visit: Payer: Self-pay | Admitting: Occupational Medicine

## 2011-04-18 ENCOUNTER — Ambulatory Visit: Payer: Self-pay

## 2011-04-18 DIAGNOSIS — R52 Pain, unspecified: Secondary | ICD-10-CM

## 2011-04-21 ENCOUNTER — Encounter: Payer: Self-pay | Admitting: Family Medicine

## 2011-04-21 ENCOUNTER — Ambulatory Visit (INDEPENDENT_AMBULATORY_CARE_PROVIDER_SITE_OTHER): Payer: BC Managed Care – PPO | Admitting: Family Medicine

## 2011-04-21 DIAGNOSIS — S4992XA Unspecified injury of left shoulder and upper arm, initial encounter: Secondary | ICD-10-CM | POA: Insufficient documentation

## 2011-04-21 DIAGNOSIS — S46909A Unspecified injury of unspecified muscle, fascia and tendon at shoulder and upper arm level, unspecified arm, initial encounter: Secondary | ICD-10-CM

## 2011-04-21 DIAGNOSIS — S4980XA Other specified injuries of shoulder and upper arm, unspecified arm, initial encounter: Secondary | ICD-10-CM

## 2011-04-21 HISTORY — DX: Unspecified injury of left shoulder and upper arm, initial encounter: S49.92XA

## 2011-04-21 MED ORDER — MELOXICAM 15 MG PO TABS: 15.0000 mg | ORAL_TABLET | Freq: Every day | ORAL | Status: DC

## 2011-04-21 NOTE — Progress Notes (Signed)
  Subjective:    Patient ID: Robin [name], female    DOB: January 04, 1956, 55 y.o.   MRN: [id number]  HPI SUBJECTIVE: Robin [name] is a 55 y.o. 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 Mechanism of injury: Pt was reaching above her head and felt a pop/cramp in her left shoulder. Immediate symptoms: immediate pain, no deformity was noted by the patient. Symptoms have been constant since that time. Prior history of related problems: no prior problems with this area in the past. She did have a right rotator cuff tear in 2009 with second repair in 2011. She was seen by Occupational Health following the injury who stated it was a muscle strain and continue to use heat and IBU for pain.    Review of Systems  All other systems reviewed and are negative.       Objective:   Physical Exam  Constitutional: She is oriented to person, place, and time. She appears well-developed and well-nourished. No distress.  HENT:  Head: Normocephalic and atraumatic.  Neck: Normal range of motion.  Cardiovascular: Normal rate and regular rhythm.   Pulmonary/Chest: Breath sounds normal.  Musculoskeletal:       Left shoulder: She exhibits decreased range of motion, tenderness, bony tenderness, pain and decreased strength. She exhibits no swelling, no crepitus and no deformity.       Patient has pain at >90 degrees abduction and abduction. + empty can test. Able to reach behind back.   Neurological: She is alert and oriented to person, place, and time. No cranial nerve deficit.          Assessment & Plan:

## 2011-04-21 NOTE — Patient Instructions (Signed)
It was nice to meet you today. I am sorry your shoulder is hurting you so badly.  I have sent a prescription for Mobic 15mg  daily to [facility] for you to pick up. Please stop taking your Ibuprofen/aleve/naproxen while you are taking the Mobic. Continue to do your exercises.   Please come back to see [place] in 3-4 weeks to see how you are doing. We can do an shoulder injection at that time, if you would like.  Take care! Galilee Pierron M. Yaden Seith, M.D.   Shoulder Range of Motion Exercises The shoulder is the most flexible joint in the human body. Because of this it is also the most unstable joint in the body. All ages can develop shoulder problems. Early treatment of problems is necessary for a good outcome. People react to shoulder pain by decreasing the movement of the joint. After a brief period of time, the shoulder can become "frozen". This is an almost complete loss of the ability to move the damaged shoulder. Following injuries your caregivers can give you instructions on exercises to keep your range of motion (ability to move your shoulder freely), or regain it if it has been lost. EXERCISES TO KEEP OR MAINTAIN YOUR SHOULDER'S MOBILITY  CODMAN'S EXERCISE OR PENDULUM EXERCISE   This exercise may be performed in a prone (face-down) lying position or standing while leaning on a chair with the opposite arm. Its purpose is to relax the muscles in your shoulder and slowly but surely increase the range of motion and to relieve pain.   Lie on your stomach close to the side edge of the bed. Let your weak arm hang over the edge of the bed. Relax your shoulder, arm and hand. Let your shoulder blade relax and drop down.   Slowly and gently swing your arm forward and back. Do not use your neck muscles; relax them. It might be easier to have someone else gently start swinging your arm.   As pain decreases, increase your swing. To start, arm swing should begin at 15 degree angles. In time and as pain lessens, move  to 30-45 degree angles. Start with swinging for about 15 seconds, and work towards swinging for 3 to 5 minutes.   This exercise may also be performed in a standing/bent over position.   Stand and hold onto a sturdy chair with your good arm. Bend forward at the waist and bend your knees slightly to help protect your back. Relax your weak arm, let it hang limp. Relax your shoulder blade and let it drop.   Keep your shoulder relaxed and use body motion to swing your arm in small circles.   Stand up tall and relax.   Repeat motion and change direction of circles.   Start with swinging for about 30 seconds, and work towards swinging for 3 to 5 minutes.  STRETCHING EXERCISES  #1: Lift your arm out in front of you with the elbow bent at 90 degrees. Using your other arm gently pull the elbow forward and across your body.   #2: Bend one arm behind you with the palm facing outward. Using the other arm, hold a towel or rope and reach this arm up above your head, then bend it at the elbow to move your wrist to behind your neck. Grab the free end of the towel with the hand behind your back. Gently pull the towel up with the hand behind your neck, gradually increasing the pull on the hand behind the small of your  back. Then, gradually pull down with the hand behind the small of your back. This will pull the hand and arm behind your neck further. Both shoulders will have an increased range of motion with repetition of this exercise.  STRENGTHENING EXERCISES  Standing with your arm at your side and straight out from your shoulder with the elbow bent at 90 degrees, hold onto a small weight and slowly raise your hand so it points straight up in the air. Repeat this five times to begin with, and gradually increase to ten times. Do this four times per day. As you grow stronger you can gradually increase the weight.   Repeat the above exercise, only this time using an elastic band. Start with your hand up in the  air and pull down until your hand is by your side. As you grow stronger, gradually increase the amount you pull by increasing the number or size of the elastic bands. Use the same amount of repetitions.   Standing with your hand at your side and holding onto a weight, gradually lift the hand in front of you until it is over your head. Do the same also with the hand remaining at your side and lift the hand away from your body until it is again over your head. Repeat this five times to begin with, and gradually increase to ten times. Do this four times per day. As you grow stronger you can gradually increase the weight.  Do all exercises with weights that are small and easy to use when beginning your exercises. Gradually increase the weight as you grow stronger. Do not do exercises to the point of pain. If your shoulder becomes painful, decrease the number of repetitions or the amount of weight used. As your comfort increases you can gradually return to the same weights, resistance, or number of repetitions. Document Released: 03/26/2003  [facility] Patient Information 2011 [place], [place].

## 2011-04-21 NOTE — Assessment & Plan Note (Signed)
Patient had injury of left shoulder one week ago. She continues to have constant throbbing pain. She has been doing range of motion exercises and taking IBU for pain. She has not had any improvement. On exam today, she had limited ROM and pain with >90 degrees. Likely rotator cuff etiology. Patient was offered a steroid injection, which she refused. We will give Mobic 15mg  daily and encouraged her to continue doing strengthening exercises (Up, Out, In with her exercise band) She will return to clinic in 3-4 weeks to be re-evaluated. At that time, we will discuss if she wants a steroid injection if her pain has not improved. Also, she will consider going to a few sessions of PT to help reteach her the shoulder exercises. For now, she can return to work next week with a 15lb lifting restriction until she is seen again by our clinic.

## 2011-05-12 ENCOUNTER — Ambulatory Visit (INDEPENDENT_AMBULATORY_CARE_PROVIDER_SITE_OTHER): Payer: BC Managed Care – PPO | Admitting: Family Medicine

## 2011-05-12 ENCOUNTER — Encounter: Payer: Self-pay | Admitting: Family Medicine

## 2011-05-12 VITALS — BP 160/70 | HR 87 | Temp 98.1°F | Ht 64.0 in | Wt 128.0 lb

## 2011-05-12 DIAGNOSIS — E119 Type 2 diabetes mellitus without complications: Secondary | ICD-10-CM

## 2011-05-12 DIAGNOSIS — S4992XA Unspecified injury of left shoulder and upper arm, initial encounter: Secondary | ICD-10-CM

## 2011-05-12 DIAGNOSIS — S46909A Unspecified injury of unspecified muscle, fascia and tendon at shoulder and upper arm level, unspecified arm, initial encounter: Secondary | ICD-10-CM

## 2011-05-12 DIAGNOSIS — S4980XA Other specified injuries of shoulder and upper arm, unspecified arm, initial encounter: Secondary | ICD-10-CM

## 2011-05-12 DIAGNOSIS — I10 Essential (primary) hypertension: Secondary | ICD-10-CM

## 2011-05-12 NOTE — Assessment & Plan Note (Signed)
A1c showed a 4. reduction in 5 months time. Seems to be improving patient is taking it seriously. We will make no changes and we'll get an A1c in 3 months time. At that time if elevated then would consider starting Januvia and see if we can get a little more improvement Encourage diet and exercise control still

## 2011-05-12 NOTE — Progress Notes (Signed)
  Subjective:    Patient ID: Robin [name], female    DOB: 1956-02-04, 55 y.o.   MRN: [id number]  HPI 1. Hypertension Blood pressure at home: Not checking Blood pressure today: Elevated 170/70 Taking Meds: Yes Side effects: No ROS: Denies headache visual changes nausea, vomiting, chest pain or abdominal pain or shortness of breath.   Diabetes:  High at home: 160s patient states that only checking 3 times a week Low at home: 55 and was able to feel when she was low Taking medications: Yes Side effects: No ROS: denies fever, chills, dizziness, loss of conscieness, polyuria poly dipsia numbness or tingling in extremities or chest pain. Lab Results  Component Value Date   HGBA1C 9.3 05/12/2011   which is improved from 13.5 previously  Left shoulder pain: Patient is thinking about it she does not know exactly when she hurt her left shoulder. But at this point she feels has become a little weak has referred pain down her left arm. Has a history of having a rotator cuff tear on the right side and states that this feels fairly similar.  Patient denies any type of numbness still able to do most activities of daily living but is finding it hard to work due to her having to lifts certain things that way more than 15 pounds.  Patient was seen previously for this was given meloxicam which she is taking but has not improved at all in the way patient declined a steroid injection at last visit but would consider one this time.  Review of Systems As stated in history of present illness    Objective:   Physical Exam  Constitutional: She is oriented to person, place, and time. She appears well-developed and well-nourished. No distress.  Neck: Normal range of motion.  Cardiovascular: Normal rate and regular rhythm.   Pulmonary/Chest: Breath sounds normal.  Musculoskeletal:       Left shoulder: She exhibits decreased range of motion, tenderness,, pain and decreased strength. She exhibits no  swelling, no crepitus and no deformity.       Patient has pain at >90 degrees abduction and abduction. + empty can test. Mild decrease in internal rotation  Neurological: She is alert and oriented to person, place, and time. No cranial nerve deficit.     Assessment & Plan:

## 2011-05-12 NOTE — Assessment & Plan Note (Signed)
Most likely subacromial bursitis with a possible rotator cuff syndrome. Told to continue exercises at home will followup in 3 weeks' time for reevaluation if still giving pain would consider doing a nitroglycerin patch as well as physical therapy. Patient did have FMLA paperwork filled out stating that she could only lift up to 15 pounds for the next 3 weeks.  Procedure note After verbal and written consent given pt was prepped with betadine.  1:3 kenalog 40 to lidocaine used in left shoulder.  Pt minimal bleeding dressed with band aid, Pt given red flags to look for pt had better pain control immediatly.

## 2011-05-12 NOTE — Assessment & Plan Note (Signed)
Elevated but will be dressed at followup visit in 3 weeks no change in management at this time

## 2011-05-12 NOTE — Patient Instructions (Signed)
Patient given verbal instructions 

## 2011-05-13 ENCOUNTER — Telehealth: Payer: Self-pay | Admitting: Family Medicine

## 2011-05-13 MED ORDER — FLUTICASONE PROPIONATE 50 MCG/ACT NA SUSP: 1.0000 | Freq: Every day | NASAL | Status: DC

## 2011-05-13 NOTE — Telephone Encounter (Signed)
To MD as I see no mention of this in chart. Robin [name], Robin [name]

## 2011-05-13 NOTE — Telephone Encounter (Signed)
Pt checking status of Rx that was supposed to be sent yesterday for allergies, was a nose spray. Pt goes to [facility].

## 2011-05-13 NOTE — Telephone Encounter (Signed)
done

## 2011-06-06 ENCOUNTER — Encounter: Payer: Self-pay | Admitting: Family Medicine

## 2011-06-06 ENCOUNTER — Ambulatory Visit (INDEPENDENT_AMBULATORY_CARE_PROVIDER_SITE_OTHER): Payer: BC Managed Care – PPO | Admitting: Family Medicine

## 2011-06-06 VITALS — BP 144/84 | HR 97 | Temp 98.0°F | Ht 64.0 in | Wt 126.0 lb

## 2011-06-06 DIAGNOSIS — S4992XA Unspecified injury of left shoulder and upper arm, initial encounter: Secondary | ICD-10-CM

## 2011-06-06 DIAGNOSIS — S46909A Unspecified injury of unspecified muscle, fascia and tendon at shoulder and upper arm level, unspecified arm, initial encounter: Secondary | ICD-10-CM

## 2011-06-06 DIAGNOSIS — S4980XA Other specified injuries of shoulder and upper arm, unspecified arm, initial encounter: Secondary | ICD-10-CM

## 2011-06-06 MED ORDER — MELOXICAM 15 MG PO TABS: 15.0000 mg | ORAL_TABLET | Freq: Every day | ORAL | Status: DC

## 2011-06-06 MED ORDER — KETOROLAC TROMETHAMINE 60 MG/2ML IM SOLN
60.0000 mg | Freq: Once | INTRAMUSCULAR | Status: AC
  Administered 2011-06-06: 60 mg via INTRAMUSCULAR

## 2011-06-06 MED ORDER — NITROGLYCERIN 0.2 MG/HR TD PT24: MEDICATED_PATCH | TRANSDERMAL | Status: DC

## 2011-06-06 MED ORDER — TRAMADOL HCL 50 MG PO TABS: 50.0000 mg | ORAL_TABLET | Freq: Three times a day (TID) | ORAL | Status: DC | PRN

## 2011-06-06 NOTE — Progress Notes (Signed)
Addended by: Judi Saa on: 06/06/2011 12:05 PM   Modules accepted: Orders

## 2011-06-06 NOTE — Patient Instructions (Addendum)
I'm sorry or shoulder still hurts. I'm going to give you a nitroglycerin patch to wear. Wear half a patch on your shoulder daily I when she to take meloxicam daily for the next 10 days I'm going to send you to physical therapy for iontophoresis And when she to alternate Tylenol and tramadol every 4 hours as needed I when she to come back and see me in 3 weeks I am giving you a note for work to do light duty only.

## 2011-06-06 NOTE — Assessment & Plan Note (Signed)
Patient continues to worsen and not getting better at this time. Patient encouraged to decrease the amount of exercise she is doing on this arm for the next week and then restart her with the therapy that she was given previously. Patient will start a nitroglycerin patch half patch daily Patient will do a burst of meloxicam and given the Toradol shot today. Tylenol and tramadol every 4 hours as needed. Physical therapy for iontophoresis Patient can continue her TENS unit at home. We'll have patient come back in 3 weeks for reevaluation  Patient given note for light duty at work

## 2011-06-06 NOTE — Progress Notes (Signed)
  Subjective:    Patient ID: Robin [name], female    DOB: Dec 26, 1955, 55 y.o.   MRN: [id number]  HPI  1. Hypertension Blood pressure at home: Not checking Blood pressure today: Elevated 144/84 Taking Meds: Yes Side effects: No ROS: Denies headache visual changes nausea, vomiting, chest pain or abdominal pain or shortness of breath.    Left shoulder pain: Patient was seen previously for this problem. Seem to be a subacromial bursitis giving her a rotator cuff syndrome. Patient did have a steroid injection done 3 weeks ago. At that time and it seemed to improve but over the course of approximately last week it seems to be worse again. Patient has been taking a very light at work but does need to return to work do to financial reasons. Patient denies any weakness but has found it very hard to do certain activities of daily living such as scratching her back or combing her hair. Patient does have a history of a rotator cuff repair on the right side and does state that this feels fairly similar. She denies any fever, chills, rash in the area, or any numbness or weakness. Review of Systems  As stated in history of present illness    Objective:   Physical Exam   Constitutional: She is oriented to person, place, and time. She appears well-developed and well-nourished. No distress.  Neck: Normal range of motion.  Cardiovascular: Normal rate and regular rhythm.   Pulmonary/Chest: Breath sounds normal.  Musculoskeletal:       Left shoulder: She exhibits decreased range of motion, tenderness,, pain and decreased strength. She exhibits no swelling, no crepitus and no deformity.       Patient has pain at >90 degrees abduction and abduction. + empty can test. Mild decrease in internal rotation and now some decrease in external rotation as well Neurological: She is alert and oriented to person, place, and time. No cranial nerve deficit.     Assessment & Plan:

## 2011-06-28 ENCOUNTER — Ambulatory Visit (INDEPENDENT_AMBULATORY_CARE_PROVIDER_SITE_OTHER): Payer: BC Managed Care – PPO | Admitting: Family Medicine

## 2011-06-28 ENCOUNTER — Encounter: Payer: Self-pay | Admitting: Family Medicine

## 2011-06-28 DIAGNOSIS — S46909A Unspecified injury of unspecified muscle, fascia and tendon at shoulder and upper arm level, unspecified arm, initial encounter: Secondary | ICD-10-CM

## 2011-06-28 DIAGNOSIS — F4321 Adjustment disorder with depressed mood: Secondary | ICD-10-CM

## 2011-06-28 DIAGNOSIS — S4992XA Unspecified injury of left shoulder and upper arm, initial encounter: Secondary | ICD-10-CM

## 2011-06-28 DIAGNOSIS — G4700 Insomnia, unspecified: Secondary | ICD-10-CM

## 2011-06-28 DIAGNOSIS — S4980XA Other specified injuries of shoulder and upper arm, unspecified arm, initial encounter: Secondary | ICD-10-CM

## 2011-06-28 MED ORDER — NITROGLYCERIN 0.1 MG/HR TD PT24: MEDICATED_PATCH | TRANSDERMAL | Status: DC

## 2011-06-28 MED ORDER — ZOLPIDEM TARTRATE 5 MG PO TABS: 5.0000 mg | ORAL_TABLET | Freq: Every evening | ORAL | Status: DC | PRN

## 2011-06-28 NOTE — Patient Instructions (Addendum)
It is good to see you. I when she to go and call some [facility] or other pharmacies and see if he can get the nitroglycerin patch cheaper. I will give to him with physical therapy. I want you to come and see me in one month time. You could also make an appointment in one month with sports medicine if he would like an ultrasound of your shoulder. I am giving you some medicine for sleep. Happy holidays and happy new year.
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
## 2011-06-29 NOTE — Assessment & Plan Note (Signed)
Patient continues to have trouble with the grief of her son's death. Patient though declines any medication or any physical psychological help. Patient given a card for Dr. Pascal Lux and will followup with me in 2-4 weeks for reevaluation. Patient denies any homicidal or suicidal ideation.

## 2011-06-29 NOTE — Assessment & Plan Note (Signed)
Refilled her in the in and will use it as needed. Discussed proper sleep higene and likely this is more due to her grief reaction/depression.

## 2011-06-29 NOTE — Assessment & Plan Note (Addendum)
Recent in nitroglycerin patch found out that at patient's [facility] it cost $40 per month supply. Patient says she does not know if she can do for that. Patient given a heart prescription as well to call around and see if she can get it cheaper price. Patient will do physical therapy as well. Patient will return in 2-4 weeks for reevaluation.

## 2011-07-25 ENCOUNTER — Encounter: Payer: Self-pay | Admitting: Family Medicine

## 2011-07-25 ENCOUNTER — Ambulatory Visit (INDEPENDENT_AMBULATORY_CARE_PROVIDER_SITE_OTHER): Payer: BC Managed Care – PPO | Admitting: Family Medicine

## 2011-07-25 VITALS — BP 143/86 | HR 91 | Ht 64.0 in | Wt 125.0 lb

## 2011-07-25 DIAGNOSIS — M25512 Pain in left shoulder: Secondary | ICD-10-CM

## 2011-07-25 DIAGNOSIS — M25519 Pain in unspecified shoulder: Secondary | ICD-10-CM

## 2011-07-26 NOTE — Progress Notes (Signed)
  Subjective:    Patient ID: Robin [name], female    DOB: Apr 09, 1956, 56 y.o.   MRN: [id number]  HPI In October 2012 she was reaching behind this does with her left arm when she felt something snap or pull in her left shoulder. It has continued to bother her since then. Certain motions such as reaching back behind her work trying to sleep on that shoulder or the most difficult. She has previously had rotator cuff repair surgery on the right shoulder. This feels similar in many respects to the issue she had on the right at that time she had the right shoulder issues however she was also having neck issues and ended up having a disc replacement done in her neck so she's not entirely sure which symptom was related to what areas  Right-hand dominant. No numbness in her fingers. Pain shoots into her upper arm particularly if she tries to lie on that side at night. She's had no numbness in the hand. She's noted no swelling in the shoulder, no redness, no warmth. The arm does not feel particularly weak but she is unable to do some things because certain motions make it hurt.  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 Also denies unusual weight change, fever, sweats, chills. Objective:   Physical Exam  Vital signs reviewed. GENERAL: Well developed, well nourished, no acute distress SHOULDER: Mild pain with resisted supraspinatus testing but her strength is intact. Strength intact in actual and internal rotation. Internal rotation when placing her hand behind her back is quite painful. The low to him shift test reveals a faint click and increased pain in the posterior shoulder area. Axial loading with grind is also painful but there is no sense of click. Negative apprehension  sign. Bilaterally the shoulders are symmetrical and there is negative sulcus sign. Distally she is neurovascular intact.  INJECTION: Patient was given informed consent, signed copy in the chart. Appropriate time out was taken. Area prepped and draped in usual sterile fashion. 2 cc of methylprednisolone 40 mg/ml plus  6 cc of 1% lidocaine without epinephrine was injected into the subacromial bursa on the left injecting 2 cc of the solution there and then injecting the remaining 6 cc into the glenohumeral joint using a(n) posterior approach. The patient tolerated the procedure well. There were no complications. Post procedure instructions were given.       Assessment & Plan:  Left shoulder pain. We discussed options. I think she has a small labral tear. We decided to try glenohumeral joint injection and if she had significant relief then we would just follow her conservatively. If she gets no relief from this then I will pursue MR arthrogram eerie rotator cuff seems intact

## 2011-07-27 ENCOUNTER — Ambulatory Visit (INDEPENDENT_AMBULATORY_CARE_PROVIDER_SITE_OTHER): Payer: BC Managed Care – PPO | Admitting: Family Medicine

## 2011-07-27 ENCOUNTER — Encounter: Payer: Self-pay | Admitting: Family Medicine

## 2011-07-27 DIAGNOSIS — I10 Essential (primary) hypertension: Secondary | ICD-10-CM

## 2011-07-27 DIAGNOSIS — S4980XA Other specified injuries of shoulder and upper arm, unspecified arm, initial encounter: Secondary | ICD-10-CM

## 2011-07-27 DIAGNOSIS — E119 Type 2 diabetes mellitus without complications: Secondary | ICD-10-CM

## 2011-07-27 DIAGNOSIS — S46909A Unspecified injury of unspecified muscle, fascia and tendon at shoulder and upper arm level, unspecified arm, initial encounter: Secondary | ICD-10-CM

## 2011-07-27 DIAGNOSIS — S4992XA Unspecified injury of left shoulder and upper arm, initial encounter: Secondary | ICD-10-CM

## 2011-07-27 NOTE — Assessment & Plan Note (Signed)
Per patient's report seems to be improving. We will get an A1c when patient follows up in one month's time.

## 2011-07-27 NOTE — Assessment & Plan Note (Signed)
Patient and never gone to physical therapy also never got nitroglycerin patch which I do think would've helped. There is a potential for patient having a labral tear will allow sports medicine to manage from here on imaging. Appreciate their expertise.

## 2011-07-27 NOTE — Progress Notes (Signed)
  Subjective:    Patient ID: Robin [name], female    DOB: [birth year], 56 y.o.   MRN: [id number]  HPI 1. Hypertension Blood pressure at home: Not checking Blood pressure today: 128/80 on recheck Taking Meds: Yes Side effects: No ROS: Denies headache visual changes nausea, vomiting, chest pain or abdominal pain or shortness of breath.  2.  Diabetes:  High at home: 220 Low at home: 95 Taking medications: Yes and much more regularly Side effects: No ROS: denies fever, chills, dizziness, loss of conscieness, polyuria poly dipsia numbness or tingling in extremities or chest pain. Lab Results  Component Value Date   HGBA1C 9.3 05/12/2011    3. shoulder pain-patient has had this chronic left shoulder pain for some time that has kept her out of work. Patient was recently seen by Dr. Jennette Kettle it sports medicine did have an injection has a potential for having a labral tear. Patient states that there's been some improvement in the pain but still has pain with when she reaches behind her back. Denies any swelling denies any numbness in the extremity able to do most activities of daily living except at work where she does have to move large boxes. Patient was told that if not better by Friday she will call Dr. Jennette Kettle for the potential of getting an MRI  4 grief reaction-patient is doing much better at this time to stop taking the hydroxyzine and feeling good. Patient is uninterested to talk about having more denies any suicidal or homicidal ideation. Review of Systems As stated in the history of present illness   Past medical history, social, surgical and family history all reviewed.   Objective:   Physical Exam Constitutional: She is oriented to person, place, and time.  well-nourished. No distress.  Cardiovascular: Normal rate and regular rhythm.   Pulmonary/Chest: Breath sounds normal.  Musculoskeletal:       Left shoulder: She exhibits decreased range of motion, tenderness,, pain  improved strength from last visit. She exhibits no swelling, no crepitus and no deformity.       Patient has improvement in range of motion but still + empty can test and OBrien.  crease in internal rotation Neurological: She is alert and oriented to person, place, and time. No cranial nerve deficit.     Assessment & Plan:

## 2011-07-27 NOTE — Patient Instructions (Signed)
See you in 1 month we will get labs.

## 2011-07-27 NOTE — Assessment & Plan Note (Signed)
Improved since last visit patient has become much more compliant with her medications and I think this is helped. We'll recheck again at followup. At that time as well we should consider getting another basic metabolic panel.

## 2011-07-28 ENCOUNTER — Telehealth: Payer: Self-pay | Admitting: Family Medicine

## 2011-07-28 NOTE — Telephone Encounter (Signed)
Called patient told her to be up front and available .

## 2011-07-28 NOTE — Telephone Encounter (Signed)
The patient was in yesterday and received a note to go back to work, but the workplace is asking that it also say if she can return to full work or if there are restriction.  Please call when it is ready for pick up.  The number listed with this message is her husbands cell phone.

## 2011-08-01 ENCOUNTER — Telehealth: Payer: Self-pay | Admitting: Family Medicine

## 2011-08-01 DIAGNOSIS — E119 Type 2 diabetes mellitus without complications: Secondary | ICD-10-CM

## 2011-08-01 DIAGNOSIS — M25512 Pain in left shoulder: Secondary | ICD-10-CM

## 2011-08-01 NOTE — Telephone Encounter (Signed)
Injection given to shoulder did not help with pain.   Now need MRI to determine what is the problem to lf shoulder. Please call patient to discuss.

## 2011-08-02 DIAGNOSIS — M25512 Pain in left shoulder: Secondary | ICD-10-CM | POA: Insufficient documentation

## 2011-08-02 NOTE — Telephone Encounter (Signed)
Robin [name] / [name]  Please call and set her up for a LEFT SHOULDER ARTHROGRAM> she will have to come by [facility] for labs---I have the order in. If she has questions let me know but this is the plan we had discussed (I injected her and if no better she was to call back and get MRI/ arthrogram scheduled>) THANKS! Denny Levy

## 2011-08-03 ENCOUNTER — Other Ambulatory Visit: Payer: Self-pay | Admitting: Family Medicine

## 2011-08-03 ENCOUNTER — Other Ambulatory Visit: Payer: Self-pay | Admitting: *Deleted

## 2011-08-03 DIAGNOSIS — M25512 Pain in left shoulder: Secondary | ICD-10-CM

## 2011-08-03 DIAGNOSIS — I10 Essential (primary) hypertension: Secondary | ICD-10-CM

## 2011-08-03 NOTE — Telephone Encounter (Signed)
Pt scheduled for 08/09/11  @ 1:45pm- left pt a VM to return my call re: appt info.

## 2011-08-03 NOTE — Progress Notes (Signed)
Authorization # [id number].   Given to christy at [facility].  Called pt- left her a VM to call back for appt info.   Appt scheduled for 08/09/11 arrive at 1:45 pm at [address] [facility].    Gave pt appt info via phone.  Advised her to make sure she gets her labs done at [facility] before MRI.

## 2011-08-04 ENCOUNTER — Other Ambulatory Visit: Payer: BC Managed Care – PPO

## 2011-08-04 ENCOUNTER — Other Ambulatory Visit: Payer: Self-pay | Admitting: Family Medicine

## 2011-08-04 DIAGNOSIS — M25512 Pain in left shoulder: Secondary | ICD-10-CM

## 2011-08-04 DIAGNOSIS — E119 Type 2 diabetes mellitus without complications: Secondary | ICD-10-CM

## 2011-08-04 LAB — BASIC METABOLIC PANEL
BUN: 13 mg/dL (ref 6–23)
Chloride: 102 mEq/L (ref 96–112)
Creat: 0.67 mg/dL (ref 0.50–1.10)

## 2011-08-04 NOTE — Telephone Encounter (Signed)
Refill request

## 2011-08-04 NOTE — Progress Notes (Signed)
Bmp done today Robin [name] 

## 2011-08-09 ENCOUNTER — Telehealth: Payer: Self-pay | Admitting: Family Medicine

## 2011-08-09 ENCOUNTER — Ambulatory Visit
Admission: RE | Admit: 2011-08-09 | Discharge: 2011-08-09 | Disposition: A | Discharge status: Home / Self Care | Payer: BC Managed Care – PPO | Source: Ambulatory Visit | Attending: Family Medicine | Admitting: Family Medicine

## 2011-08-09 DIAGNOSIS — M25512 Pain in left shoulder: Secondary | ICD-10-CM

## 2011-08-09 MED ORDER — IOHEXOL 180 MG/ML  SOLN
20.0000 mL | Freq: Once | INTRAMUSCULAR | Status: AC | PRN
  Administered 2011-08-09: 20 mL via INTRA_ARTICULAR

## 2011-08-09 NOTE — Telephone Encounter (Signed)
Need a doctor's note for work.  Just left having her MRI, so will need note to show restrictions.

## 2011-08-10 NOTE — Telephone Encounter (Signed)
Pt informed and note placed up front. Robin [name], Robin [name]

## 2011-08-10 NOTE — Telephone Encounter (Signed)
Routed note to support staff to print and give to patient stating nothing overhead greater than 20# MRI should have gone to Dr. Jennette Kettle and likely will need further evaluation.

## 2011-08-15 ENCOUNTER — Telehealth: Payer: Self-pay | Admitting: Family Medicine

## 2011-08-15 ENCOUNTER — Other Ambulatory Visit: Payer: Self-pay | Admitting: Family Medicine

## 2011-08-15 NOTE — Telephone Encounter (Signed)
Robin [name] and tell [name] I saw the arthrogram of [name]. There is no tear of any of the rotator cuff muscles and that is good news. The joint lining also looks good and that was what I was worried. 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 This is probably what is causing [name]. I would like to send [name] for further evaluation. Since she has had a rotator cuff repair in the past, she may want to go to the liver did that. If not let me know we can set [name] up with either Dewaine Conger or other.

## 2011-08-15 NOTE — Telephone Encounter (Signed)
Spoke with pt- advised of message from Dr. Jennette Kettle. Scheduled pt for appt with Dr. Turner Daniels per her req 08/18/11 @ 3:45pm.  Pt notified of appt info, records sent.

## 2011-08-15 NOTE — Progress Notes (Signed)
Reviewed MR Arthrogram: All of the rotator cuff muscles appear intact. Agree there is an unusual calcific opacities close to the a.c. joint. This could probably be from a prior injury. Also note on series 6 images 8 and 9 and series 8 image 8  the greater tuberosity appears enlarged, irregular. Unclear if this is causing some of her pain as well. I think she would best be served by seeing orthopedic surgery we will set that up.

## 2011-08-23 ENCOUNTER — Encounter: Payer: Self-pay | Admitting: Family Medicine

## 2011-08-23 ENCOUNTER — Ambulatory Visit (INDEPENDENT_AMBULATORY_CARE_PROVIDER_SITE_OTHER): Payer: BC Managed Care – PPO | Admitting: Family Medicine

## 2011-08-23 VITALS — BP 131/84 | HR 97 | Temp 98.1°F | Ht 64.0 in | Wt 130.0 lb

## 2011-08-23 DIAGNOSIS — E119 Type 2 diabetes mellitus without complications: Secondary | ICD-10-CM

## 2011-08-23 DIAGNOSIS — S4992XA Unspecified injury of left shoulder and upper arm, initial encounter: Secondary | ICD-10-CM

## 2011-08-23 DIAGNOSIS — E782 Mixed hyperlipidemia: Secondary | ICD-10-CM

## 2011-08-23 DIAGNOSIS — I10 Essential (primary) hypertension: Secondary | ICD-10-CM

## 2011-08-23 DIAGNOSIS — S46909A Unspecified injury of unspecified muscle, fascia and tendon at shoulder and upper arm level, unspecified arm, initial encounter: Secondary | ICD-10-CM

## 2011-08-23 DIAGNOSIS — S4980XA Other specified injuries of shoulder and upper arm, unspecified arm, initial encounter: Secondary | ICD-10-CM

## 2011-08-23 LAB — POCT GLYCOSYLATED HEMOGLOBIN (HGB A1C): Hemoglobin A1C: 10.6

## 2011-08-23 LAB — LDL CHOLESTEROL, DIRECT: Direct LDL: 136 mg/dL — ABNORMAL HIGH

## 2011-08-23 NOTE — Patient Instructions (Signed)
It is good to see you. Lab Results  Component Value Date   HGBA1C 10.6 08/23/2011   This did increase from 9.3 last time. We have discussed this at length today and you feel that you can bring it down on her own. I would like to check this again in 8 weeks to make sure it's coming down. Please make sure you are chew diet and exercise at least 30 minutes most days of the week. We discussed the use of Januvia or changing to insulin and he declined both of these today. If in 8 weeks here A1c is still above 10 I think we need to start insulin. You have the Chantix in the electronic cigarettes at home to quit smoking when you're ready set a quit date and go for it. If you want to talk to me or talk to Dr. Raymondo Band who can help just call and make an appointment. Your blood pressure is good we will make no changes there. I would like to see you again in 2 months.

## 2011-08-23 NOTE — Assessment & Plan Note (Signed)
Discussed at length with patient. Patient declined increasing to another oral medication. Patient also declined going to insulin. Patient was told about the potential consequences and at this time we will actually have patient followup in 6 weeks for another A1c at that time if still elevated above 10 we have discussed and then we will start insulin. Patient is going to focus on diet and exercise and try very hard to bring down blood sugars.

## 2011-08-23 NOTE — Progress Notes (Signed)
  Subjective:    Patient ID: Robin [name], female    DOB: [birth year], 56 y.o.   MRN: [id number]  HPI 1. Hypertension Blood pressure at home: Not checking Blood pressure today: 131/84  Taking Meds: Yes Side effects: No ROS: Denies headache visual changes nausea, vomiting, chest pain or abdominal pain or shortness of breath.   Diabetes:  High at home: 225 Low at home: 40 but this only occurred one time Taking medications: Yes including glipizide and metformin. Side effects: No except when her blood sugar get too low. ROS: denies fever, chills, dizziness, loss of conscieness, polyuria poly dipsia numbness or tingling in extremities or chest pain.  Lab 08/23/11 1413  HGBA1C 10.6   Left shoulder pain-patient states that she still having pain had MRI done showed the rotator cuff is intact. Patient is following up with Dr. Elita [name] of [facility] orthopedics and he is now taking care of problem. Patient states she is doing the exercises that she's going to start physical therapy Robin [name]. Patient is not using much pain medication at this time. Patient has not been able to work because she cannot do light duty.   Review of Systems Denies fever, chills, nausea vomiting abdominal pain, dysuria, chest pain, shortness of breath dyspnea on exertion or numbness in extremities Past medical history, social, surgical and family history all reviewed.      Objective:   Physical Exam Constitutional: She is oriented to person, place, and time.  well-nourished. No distress.  Cardiovascular: Normal rate and regular rhythm.   Pulmonary/Chest: Breath sounds normal.  Musculoskeletal:       Left shoulder: She exhibits decreased range of motion, tenderness,, pain improved and strength from last visit. She exhibits no swelling, no crepitus and no deformity.       Patient has improvement in range of motion Neurological: She is alert and oriented to person, place, and time. No cranial nerve deficit.   foot exam: Sensation intact good cap refill distal pulses intact no skin breakdown. Assessment & Plan:

## 2011-08-23 NOTE — Assessment & Plan Note (Signed)
Right at goal we'll make no changes.

## 2011-08-23 NOTE — Assessment & Plan Note (Signed)
Patient now is being followed up by orthopedic surgeons. Has had surgery before on the right side do not have any signs that surgery would help this time. Patient is to be sent to physical therapy and monitor closely.

## 2011-08-23 NOTE — Assessment & Plan Note (Signed)
Direct LDL drawn today  

## 2011-08-24 ENCOUNTER — Telehealth: Payer: Self-pay | Admitting: Family Medicine

## 2011-08-24 MED ORDER — PRAVASTATIN SODIUM 40 MG PO TABS: 40.0000 mg | ORAL_TABLET | Freq: Every day | ORAL | Status: DC

## 2011-08-24 NOTE — Telephone Encounter (Signed)
Called patient told her that her LDL has improved but still higher than goal. At this time we'll start her on pravastatin taking one pill at night. Told patient of potential side effects and to call if these occur. Patient will return to see me as scheduled.

## 2011-09-18 ENCOUNTER — Other Ambulatory Visit: Payer: Self-pay | Admitting: Family Medicine

## 2011-09-18 NOTE — Telephone Encounter (Signed)
Refill request

## 2011-10-06 ENCOUNTER — Ambulatory Visit: Payer: Self-pay | Admitting: Family Medicine

## 2011-12-01 ENCOUNTER — Ambulatory Visit: Payer: Self-pay | Admitting: Family Medicine

## 2011-12-06 ENCOUNTER — Ambulatory Visit (INDEPENDENT_AMBULATORY_CARE_PROVIDER_SITE_OTHER): Payer: BC Managed Care – PPO | Admitting: Family Medicine

## 2011-12-06 ENCOUNTER — Encounter: Payer: Self-pay | Admitting: Family Medicine

## 2011-12-06 VITALS — BP 128/80 | HR 85 | Temp 97.6°F | Ht 64.0 in | Wt 138.0 lb

## 2011-12-06 DIAGNOSIS — M2140 Flat foot [pes planus] (acquired), unspecified foot: Secondary | ICD-10-CM

## 2011-12-06 DIAGNOSIS — E119 Type 2 diabetes mellitus without complications: Secondary | ICD-10-CM

## 2011-12-06 DIAGNOSIS — I10 Essential (primary) hypertension: Secondary | ICD-10-CM

## 2011-12-06 MED ORDER — TRAMADOL HCL 50 MG PO TABS: 50.0000 mg | ORAL_TABLET | Freq: Three times a day (TID) | ORAL | Status: DC | PRN

## 2011-12-06 NOTE — Assessment & Plan Note (Signed)
Patient given arch supports given exercises for home. Patient will followup in 3 months time. Patient given name for over-the-counter orthotics. If still having pain need to consider custom orthotics.

## 2011-12-06 NOTE — Patient Instructions (Signed)
You are doing so great! Your A1c has improved. Our goal next time is to have an A1c less than 7. I am giving you some arch bandages to help your Feet. Wear these daily. I am giving you information for colonoscopy and mammogram. You also need to make an appointment for a Pap smear soon. It has been apparently getting to know you over the course of the 3 years. Thank you.

## 2011-12-06 NOTE — Assessment & Plan Note (Signed)
Patient is doing well is at goal we'll continue to monitor.

## 2011-12-06 NOTE — Progress Notes (Signed)
Patient ID: Robin [name], female   DOB: 24-Mar-1956, 56 y.o.   MRN: [id number]  Subjective:    Patient ID: Robin [name], female    DOB: [birth year], 56 y.o.   MRN: [id number]  Diabetes   1. Hypertension Blood pressure at home: Not checking Blood pressure today: 128/80 Taking Meds: Yes Side effects: No ROS: Denies headache visual changes nausea, vomiting, chest pain or abdominal pain or shortness of breath.   Diabetes:  High at home: 200 Low at home: 67 but this only occurred one time Taking medications: Yes including glipizide and metformin. Side effects: No except when her blood sugar get too low. ROS: denies fever, chills, dizziness, loss of conscieness, polyuria poly dipsia numbness or tingling in extremities or chest pain.  Lab 12/06/11 1456  HGBA1C 7.7   improved from 10.4  Left shoulder pain-patient states that she still having pain had MRI done showed the rotator cuff is intact. Patient is following up with Dr. Elita Quick of [facility] and was supposed to have surgery but she needs to work at this time. Patient states she is doing the exercises and has finished physical therapy. Patient is not using much pain medication at this time only tramadol as needed.    Review of Systems Denies fever, chills, nausea vomiting abdominal pain, dysuria, chest pain, shortness of breath dyspnea on exertion or numbness in extremities Past medical history, social, surgical and family history all reviewed.      Objective:   Physical Exam Constitutional: She is oriented to person, place, and time.  well-nourished. No distress.  Cardiovascular: Normal rate and regular rhythm.   Pulmonary/Chest: Breath sounds normal.  Neurological: She is alert and oriented to person, place, and time. No cranial nerve deficit.    foot exam: Sensation intact good cap refill distal pulses intact no skin breakdown. Patient does have some pes planus bilaterally with breakdown a  longitudinal and transverse arches bilaterally. Assessment & Plan:

## 2011-12-06 NOTE — Assessment & Plan Note (Signed)
Improvement in A1c we'll make no changes in medications encourage patient to continue to monitor diet and exercise.

## 2012-01-02 ENCOUNTER — Other Ambulatory Visit: Payer: Self-pay | Admitting: *Deleted

## 2012-01-02 MED ORDER — METFORMIN HCL 1000 MG PO TABS: 1000.0000 mg | ORAL_TABLET | Freq: Two times a day (BID) | ORAL | Status: DC

## 2012-02-13 ENCOUNTER — Other Ambulatory Visit: Payer: Self-pay | Admitting: *Deleted

## 2012-02-13 MED ORDER — LISINOPRIL 10 MG PO TABS: 10.0000 mg | ORAL_TABLET | Freq: Every day | ORAL | Status: DC

## 2012-04-10 ENCOUNTER — Other Ambulatory Visit: Payer: Self-pay | Admitting: *Deleted

## 2012-04-10 MED ORDER — METFORMIN HCL 1000 MG PO TABS: 1000.0000 mg | ORAL_TABLET | Freq: Two times a day (BID) | ORAL | Status: DC

## 2012-04-12 ENCOUNTER — Other Ambulatory Visit: Payer: Self-pay | Admitting: *Deleted

## 2012-04-12 MED ORDER — GLIPIZIDE 10 MG PO TABS: 10.0000 mg | ORAL_TABLET | Freq: Two times a day (BID) | ORAL | Status: DC

## 2012-04-12 NOTE — Telephone Encounter (Signed)
Refilled glipizide x 1 year

## 2012-04-30 ENCOUNTER — Ambulatory Visit (INDEPENDENT_AMBULATORY_CARE_PROVIDER_SITE_OTHER): Payer: BC Managed Care – PPO | Admitting: Family Medicine

## 2012-04-30 VITALS — BP 141/87 | HR 88 | Temp 99.0°F | Wt 137.0 lb

## 2012-04-30 DIAGNOSIS — F17200 Nicotine dependence, unspecified, uncomplicated: Secondary | ICD-10-CM

## 2012-04-30 DIAGNOSIS — Z23 Encounter for immunization: Secondary | ICD-10-CM

## 2012-04-30 DIAGNOSIS — I10 Essential (primary) hypertension: Secondary | ICD-10-CM

## 2012-04-30 DIAGNOSIS — E119 Type 2 diabetes mellitus without complications: Secondary | ICD-10-CM

## 2012-04-30 NOTE — Progress Notes (Signed)
Subjective:   1. DIABETES Type II Medications taking and tolerating-yes to full dose metformin and glipizide  Blood Sugars per patient-does not take regularly unless feels abnormal Diet-occasionally eats sweets once a week  Meal pattern: snacks throughout the day but does have some main 3 meals.  Physical activity/exercise: stationary bike 5 or 10 minutes once or twice a month   Last eye exam-sees Dr. Caryn Section Last foot exam-February 2013 Last microalbumin/on ace inhibitor-on ace Daily foot monitoring-yes  ROS- (-)Polyuria/Polydipsia/nocturia, (vision worsening needs to go to eye doctor) Vision changes, (occasional feet tingle) feet or hand numbness/pain/tingling. Hypoglycemia symptoms (shaky, sweaty, hungry,  anxious, tremor, palpitations, confusion, behavior change)-has seen lows as low as 150 or 160 feels dizzy, lightheaded, sick on stomach, sleepy  Diabetic Labs:  Lab Results  Component Value Date   HGBA1C 9.5 04/30/2012   HGBA1C 7.7 12/06/2011   HGBA1C 10.6 08/23/2011    2. Tobacco abuse-patient quit on chantix once before then started again within a year. Wanted to start again on chantix but lost whole start up kit. Wants to quit again but do it on her own. Not interested in medical intervention. Currently smoking at least 1 cigarette per day adn using e-cigarette otherwise. Husband also smokes which makes things tough-he is currently using patches  3. Hypertension-elevated today, did not take lisinopril. Well controlled previously BP Readings from Last 3 Encounters:  04/30/12 141/87  12/06/11 128/80  08/23/11 131/84  Compliant with medications-yes without side effects Denies any CP, HA, SOB, blurry vision (does state slightly worsening vision overtime and admits needs to see eye doctor), LE edema, transient weakness, orthopnea, PND.    ROS--See HPI  Past Medical History-smoking status noted: current smoker.  Reviewed problem list.  Medications- reviewed and updated Chief  complaint-noted  Objective: BP 141/87  Pulse 88  Temp 99 F (37.2 C)  Wt 137 lb (62.143 kg) Gen: NAD CV: RRR no mrg Lungs: CTAB Ext: no edema, feet warm and dry without callous or ulcer, 2+ pulses  Assessment/Plan: See problem oriented charted

## 2012-04-30 NOTE — Assessment & Plan Note (Signed)
Poor control today due to not taking lisinopril. Patient will take before next visit and follow up in 3 months.

## 2012-04-30 NOTE — Assessment & Plan Note (Signed)
Counseled on cessation today. Patient plans to quit on her own.

## 2012-04-30 NOTE — Patient Instructions (Addendum)
Dear Robin [name],   It was great to see you today. Thank you for coming to clinic. Please read below regarding the issues that we discussed.   1. You have made a goal to take your medication as directed and not miss any doses. You based this goal off the fact that your a1c got less than 8 when you took them regularly and you are hoping to get to a goal of less than 7.  2. You have also made a commitment to continue to try to quit smoking. You will call us if you need any help.   Please follow up in clinic in 12 weeks . Please call earlier if you have any questions or concerns.   Sincerely,  Dr. Tana Conch   My 5 to Fitness! These are tips I give to every patient that  are important for living a healthy life!   5: fruits and vegetables per day (work on 9 per day if you are at 5) 4: exercise 4-5 times per week for at least 30 minutes (walking counts!) 3: meals per day (don't skip breakfast!) 2: habits to quit -smoking -excess alcohol use (men >2 beer/day; women >1beer/day) 1: sweet per day (2 cookies, 1 small cup of ice cream, 12 oz soda)  These are general tips for healthy living. Try to start with 1 or 2 habit TODAY and make it a part of your life for several months. Once you have 1 or 2 habits down for several months, try to begin working on your next healthy habit. With every single step you take, you will be leading a healthier lifestyle!   Health Maintenance Due  Topic Date Due  . Ophthalmology Exam  07/23/1966  . Pap Smear  07/23/1974  . Colonoscopy  07/23/2006  . Mammogram  09/22/2011  . Lipid Panel  02/20/2012  . Hemoglobin A1c  03/07/2012  . Influenza Vaccine  03/11/2012

## 2012-04-30 NOTE — Assessment & Plan Note (Addendum)
Poorly controlled due to medication noncompliance. Admits that she needs to take her medication regularly and forgets at least 1/2 the time. Have encouraged patient to resume taking regularly and will follow up in 3 months.  Encouraged eye exam.   Refuses help with food choices through diabetes education (cost and time) as well as to exercise more (states no time)

## 2012-06-25 ENCOUNTER — Other Ambulatory Visit: Payer: Self-pay | Admitting: Family Medicine

## 2012-07-28 ENCOUNTER — Other Ambulatory Visit: Payer: Self-pay | Admitting: Family Medicine

## 2012-08-04 ENCOUNTER — Other Ambulatory Visit: Payer: Self-pay | Admitting: Family Medicine

## 2012-10-10 ENCOUNTER — Other Ambulatory Visit: Payer: Self-pay | Admitting: Family Medicine

## 2012-11-17 ENCOUNTER — Other Ambulatory Visit: Payer: Self-pay | Admitting: Family Medicine

## 2012-12-07 ENCOUNTER — Other Ambulatory Visit: Payer: Self-pay | Admitting: *Deleted

## 2012-12-07 MED ORDER — TRAMADOL HCL 50 MG PO TABS: 50.0000 mg | ORAL_TABLET | Freq: Three times a day (TID) | ORAL | Status: DC | PRN

## 2012-12-20 ENCOUNTER — Other Ambulatory Visit: Payer: Self-pay | Admitting: *Deleted

## 2012-12-20 NOTE — Telephone Encounter (Signed)
Appointment scheduled for 12/27/2012.  Robin [name]

## 2012-12-21 MED ORDER — METFORMIN HCL 1000 MG PO TABS: 1000.0000 mg | ORAL_TABLET | Freq: Two times a day (BID) | ORAL | Status: DC

## 2012-12-27 ENCOUNTER — Ambulatory Visit (INDEPENDENT_AMBULATORY_CARE_PROVIDER_SITE_OTHER): Payer: BC Managed Care – PPO | Admitting: Family Medicine

## 2012-12-27 ENCOUNTER — Encounter: Payer: Self-pay | Admitting: Family Medicine

## 2012-12-27 VITALS — BP 150/78 | HR 89 | Temp 98.7°F | Ht 64.0 in | Wt 126.0 lb

## 2012-12-27 DIAGNOSIS — E119 Type 2 diabetes mellitus without complications: Secondary | ICD-10-CM

## 2012-12-27 DIAGNOSIS — Z79899 Other long term (current) drug therapy: Secondary | ICD-10-CM

## 2012-12-27 DIAGNOSIS — F17200 Nicotine dependence, unspecified, uncomplicated: Secondary | ICD-10-CM

## 2012-12-27 DIAGNOSIS — I10 Essential (primary) hypertension: Secondary | ICD-10-CM

## 2012-12-27 DIAGNOSIS — E782 Mixed hyperlipidemia: Secondary | ICD-10-CM

## 2012-12-27 DIAGNOSIS — Z23 Encounter for immunization: Secondary | ICD-10-CM

## 2012-12-27 MED ORDER — GLIPIZIDE 10 MG PO TABS: 5.0000 mg | ORAL_TABLET | Freq: Two times a day (BID) | ORAL | Status: DC

## 2012-12-27 MED ORDER — TRAMADOL HCL 50 MG PO TABS: 50.0000 mg | ORAL_TABLET | Freq: Three times a day (TID) | ORAL | Status: DC | PRN

## 2012-12-27 NOTE — Patient Instructions (Addendum)
Upcoming visits Please see me within the month for your pap smear and well woman exam You can make another appointment to talk about your chronic pain from your shoulder and neck if the tramadol doesn't help enough.  See me in 1 month to follow up on your blood pressure. We may need to increase your medicine. We could combine this visit with the pap smear.   1. For your diabetes  *take 1/2 a pill twice a day of your glipizide. Keep taking metformin. Call me if blood sugar ever goes below 70 on this regimen.  *see handout below and make sure you always have something on hand for your low blood sugars.   *call your insurance company to see if they will cover retinal scans in clinic with our equipment (could be $40 instead of $80 at the eye doctor)  *you got your pneumonia shot, you will need a repeat in 5 years  2. Come back at your convenience for labs (do not eat before). Make an appointment forthis at the front.   3. For smoking, let me know if you want medication assistance. I am here to support you in anyway. We could also have you meet with Dr. Raymondo Band for additional assistance.  4. We will be starting you on a cholesterol medicine once we get your lab tests back.   Thanks for seeing [place], Dr. Durene Cal   Hypoglycemia (Low Blood Sugar) Hypoglycemia is when the glucose (sugar) in your blood is too low. Hypoglycemia can happen for many reasons. It can happen to people with or without diabetes. Hypoglycemia can develop quickly and can be a medical emergency.  CAUSES  Having hypoglycemia does not mean that you will develop diabetes. Different causes include:  Missed or delayed meals or not enough carbohydrates eaten.  Medication overdose. This could be by accident or deliberate. If by accident, your medication may need to be adjusted or changed.  Exercise or increased activity without adjustments in carbohydrates or medications.  A nerve disorder that affects body functions like your heart  rate, blood pressure and digestion (autonomic neuropathy).  A condition where the stomach muscles do not function properly (gastroparesis). Therefore, medications may not absorb properly.  The inability to recognize the signs of hypoglycemia (hypoglycemic unawareness).  Absorption of insulin  may be altered.  Alcohol consumption.  Pregnancy/menstrual cycles/postpartum. This may be due to hormones.  Certain kinds of tumors. This is very rare. SYMPTOMS   Sweating.  Hunger.  Dizziness.  Blurred vision.  Drowsiness.  Weakness.  Headache.  Rapid heart beat.  Shakiness.  Nervousness. DIAGNOSIS  Diagnosis is made by monitoring blood glucose in one or all of the following ways:  Fingerstick blood glucose monitoring.  Laboratory results. TREATMENT  If you think your blood glucose is low:  Check your blood glucose, if possible. If it is less than 70 mg/dl, take one of the following:  3-4 glucose tablets.   cup juice (prefer clear like apple).   cup "regular" soda pop.  1 cup milk.  -1 tube of glucose gel.  5-6 hard candies.  Do not over treat because your blood glucose (sugar) will only go too high.  Wait 15 minutes and recheck your blood glucose. If it is still less than 70 mg/dl (or below your target range), repeat treatment.  Eat a snack if it is more than one hour until your next meal. Sometimes, your blood glucose may go so low that you are unable to treat yourself. You may  need someone to help you. You may even pass out or be unable to swallow. This may require you to get an injection of glucagon, which raises the blood glucose. HOME CARE INSTRUCTIONS  Check blood glucose as recommended by your caregiver.  Take medication as prescribed by your caregiver.  Follow your meal plan. Do not skip meals. Eat on time.  If you are going to drink alcohol, drink it only with meals.  Check your blood glucose before driving.  Check your blood glucose before  and after exercise. If you exercise longer or different than usual, be sure to check blood glucose more frequently.  Always carry treatment with you. Glucose tablets are the easiest to carry.  Always wear medical alert jewelry or carry some form of identification that states that you have diabetes. This will alert people that you have diabetes. If you have hypoglycemia, they will have a better idea on what to do. SEEK MEDICAL CARE IF:   You are having problems keeping your blood sugar at target range.  You are having frequent episodes of hypoglycemia.  You feel you might be having side effects from your medicines.  You have symptoms of an illness that is not improving after 3-4 days.  You notice a change in vision or a new problem with your vision. SEEK IMMEDIATE MEDICAL CARE IF:   You are a family member or friend of a person whose blood glucose goes below 70 mg/dl and is accompanied by:  Confusion.  A change in mental status.  The inability to swallow.  Passing out. Document Released: 06/27/2005 Document Revised: 09/19/2011 Document Reviewed: 10/24/2011 [facility] Patient Information 2014 [place], [place].

## 2012-12-27 NOTE — Assessment & Plan Note (Signed)
a1c 6.6 but poor control due to hypoglycemia. Will 1/2 dose her glipizide for now and if continued hypoglycemia will d/c completely. Continue metformin. Suspect a swing in next a1c but hopeful to keep <8 and avoid hypoglycemia.

## 2012-12-27 NOTE — Progress Notes (Signed)
Subjective:   # DIABETES Type II Medications taking and tolerating-yes, metformin and glipizide (10mg  BID) full dose. Regularly compliant when previously missed about 1/2 of doses.  Blood Sugars per patient-fasting-80s mostly, has noted in 60s twice a month. Up to 120. Before lunch typically 50-60. GREATLY reduced from last visit.   Diet-has cut out sweets mainly due to taste issues Regular Exercise-no  Last microalbumin/on ace inhibitor-on ace On Aspirin-yes On statin-no, previously with diarrhea on simvastatin Daily foot monitoring-yes  ROS- Denies Polyuria,Polydipsia, nocturia, Vision changes, feet or hand numbness/pain/tingling. Endorses Hypoglycemia symptoms -weakness daily before lunch   Hemoglobin a1c:  Lab Results  Component Value Date   HGBA1C 6.6 12/27/2012   HGBA1C 9.5 04/30/2012   HGBA1C 7.7 12/06/2011    Health Maintenance Due  Topic Date Due  . Pneumococcal Polysaccharide Vaccine (#1)-today 07/23/1958  . Ophthalmology Exam -sees Dr. Caryn Section last visit 2 years ago, interested in retinal scan 07/23/1966  . Colonoscopy -handout given 07/23/2006  . Mammogram -handout given 09/22/2011  . Pap Smear -encouraged well woman visit 09/26/2011  . Hemoglobin A1c -today 07/31/2012  . Foot Exam -today 08/22/2012  . Lipid Panel -ordered for follow up AM lab visit 08/22/2012   # Tobacco abuse 10 cigarettes a day and e cigarette in addition (last visit was 1 cigarette per day. No chest pain/shortness of breath. Not interested in medical intervention (previously quit with chantix then later restarted). Interested in quitting but wants to do it on her own.   # Hypertension BP Readings from Last 3 Encounters:  12/27/12 150/78  04/30/12 141/87  12/06/11 128/80  Home monitoring-usually <130/90 Compliant with medications-yes without side effects Denies any CP, HA, SOB, blurry vision (does state slightly worsening vision overtime and admits needs to see eye doctor), LE edema, transient  weakness, orthopnea, PND.   # Hyperlipidemia Previously intolerant of statin (simvastatin-and also looks like tried pravastatin at some point). ROS as above.  ROS--See HPI  Past Medical History-Diabetes mellitus, tobacco dependence, hypertension, hyperlipidemia Reviewed problem list.  Medications- reviewed and updated Chief complaint-noted  Objective:  BP 150/78  Pulse 89  Temp(Src) 98.7 F (37.1 C) (Oral)  Ht 5\' 4"  (1.626 m)  Wt 126 lb (57.153 kg)  BMI 21.62 kg/m2 Gen: NAD, resting comfortably CV: RRR no murmurs rubs or gallops Lungs: CTAB no crackles, wheeze, rhonchi Skin: warm, dry Neuro: grossly normal, moves all extremities Ext: no edema  DM foot exam -normal monofilament testing, 2+ pulses DP and PT  Assessment/Plan:

## 2012-12-27 NOTE — Assessment & Plan Note (Signed)
Check lipids. Patient will need statin but will make decision on specific statin based off of labwork.

## 2012-12-27 NOTE — Assessment & Plan Note (Signed)
Poor control on our measure but good control at home. If above 140 at next visit likely add HCTZ.

## 2012-12-27 NOTE — Assessment & Plan Note (Signed)
Counseled on cessation. Patient wants to quit on her own once again. Discussed repeat pharmacological measures if needed and advised of [email or web address].

## 2012-12-28 ENCOUNTER — Other Ambulatory Visit: Payer: Self-pay

## 2012-12-28 ENCOUNTER — Encounter: Payer: Self-pay | Admitting: Gastroenterology

## 2012-12-28 DIAGNOSIS — Z1231 Encounter for screening mammogram for malignant neoplasm of breast: Secondary | ICD-10-CM

## 2013-01-15 ENCOUNTER — Ambulatory Visit: Payer: Self-pay

## 2013-01-17 ENCOUNTER — Other Ambulatory Visit: Payer: Self-pay

## 2013-01-18 ENCOUNTER — Emergency Department (HOSPITAL_COMMUNITY): Payer: BC Managed Care – PPO

## 2013-01-18 ENCOUNTER — Encounter (HOSPITAL_COMMUNITY): Payer: Self-pay | Admitting: *Deleted

## 2013-01-18 ENCOUNTER — Emergency Department (HOSPITAL_COMMUNITY)
Admission: EM | Admit: 2013-01-18 | Discharge: 2013-01-18 | Payer: BC Managed Care – PPO | Attending: Emergency Medicine | Admitting: Emergency Medicine

## 2013-01-18 DIAGNOSIS — S5290XA Unspecified fracture of unspecified forearm, initial encounter for closed fracture: Secondary | ICD-10-CM

## 2013-01-18 DIAGNOSIS — S0993XA Unspecified injury of face, initial encounter: Secondary | ICD-10-CM | POA: Diagnosis not present

## 2013-01-18 DIAGNOSIS — S72143A Displaced intertrochanteric fracture of unspecified femur, initial encounter for closed fracture: Secondary | ICD-10-CM | POA: Diagnosis not present

## 2013-01-18 DIAGNOSIS — E119 Type 2 diabetes mellitus without complications: Secondary | ICD-10-CM | POA: Insufficient documentation

## 2013-01-18 DIAGNOSIS — Z8719 Personal history of other diseases of the digestive system: Secondary | ICD-10-CM | POA: Diagnosis not present

## 2013-01-18 DIAGNOSIS — Y92410 Unspecified street and highway as the place of occurrence of the external cause: Secondary | ICD-10-CM | POA: Diagnosis not present

## 2013-01-18 DIAGNOSIS — S32409A Unspecified fracture of unspecified acetabulum, initial encounter for closed fracture: Secondary | ICD-10-CM | POA: Diagnosis not present

## 2013-01-18 DIAGNOSIS — S199XXA Unspecified injury of neck, initial encounter: Secondary | ICD-10-CM | POA: Diagnosis not present

## 2013-01-18 DIAGNOSIS — Z79899 Other long term (current) drug therapy: Secondary | ICD-10-CM | POA: Insufficient documentation

## 2013-01-18 DIAGNOSIS — F17200 Nicotine dependence, unspecified, uncomplicated: Secondary | ICD-10-CM | POA: Insufficient documentation

## 2013-01-18 DIAGNOSIS — S82141A Displaced bicondylar fracture of right tibia, initial encounter for closed fracture: Secondary | ICD-10-CM

## 2013-01-18 DIAGNOSIS — Z8742 Personal history of other diseases of the female genital tract: Secondary | ICD-10-CM | POA: Diagnosis not present

## 2013-01-18 DIAGNOSIS — S52609A Unspecified fracture of lower end of unspecified ulna, initial encounter for closed fracture: Secondary | ICD-10-CM | POA: Diagnosis not present

## 2013-01-18 DIAGNOSIS — S79919A Unspecified injury of unspecified hip, initial encounter: Secondary | ICD-10-CM | POA: Diagnosis present

## 2013-01-18 DIAGNOSIS — S82109A Unspecified fracture of upper end of unspecified tibia, initial encounter for closed fracture: Secondary | ICD-10-CM | POA: Insufficient documentation

## 2013-01-18 DIAGNOSIS — S5292XA Unspecified fracture of left forearm, initial encounter for closed fracture: Secondary | ICD-10-CM

## 2013-01-18 DIAGNOSIS — Z7982 Long term (current) use of aspirin: Secondary | ICD-10-CM | POA: Insufficient documentation

## 2013-01-18 DIAGNOSIS — S82409A Unspecified fracture of shaft of unspecified fibula, initial encounter for closed fracture: Secondary | ICD-10-CM

## 2013-01-18 DIAGNOSIS — S82209A Unspecified fracture of shaft of unspecified tibia, initial encounter for closed fracture: Secondary | ICD-10-CM

## 2013-01-18 DIAGNOSIS — Y9389 Activity, other specified: Secondary | ICD-10-CM | POA: Insufficient documentation

## 2013-01-18 DIAGNOSIS — S3981XA Other specified injuries of abdomen, initial encounter: Secondary | ICD-10-CM | POA: Diagnosis not present

## 2013-01-18 DIAGNOSIS — S32402A Unspecified fracture of left acetabulum, initial encounter for closed fracture: Secondary | ICD-10-CM

## 2013-01-18 DIAGNOSIS — S52509A Unspecified fracture of the lower end of unspecified radius, initial encounter for closed fracture: Secondary | ICD-10-CM | POA: Insufficient documentation

## 2013-01-18 DIAGNOSIS — S79929A Unspecified injury of unspecified thigh, initial encounter: Secondary | ICD-10-CM | POA: Diagnosis present

## 2013-01-18 HISTORY — DX: Type 2 diabetes mellitus without complications: E11.9

## 2013-01-18 LAB — TYPE AND SCREEN
ABO/RH(D): O POS
Antibody Screen: NEGATIVE

## 2013-01-18 LAB — URINE MICROSCOPIC-ADD ON

## 2013-01-18 LAB — BASIC METABOLIC PANEL
CO2: 19 mEq/L (ref 19–32)
Glucose, Bld: 145 mg/dL — ABNORMAL HIGH (ref 70–99)
Potassium: 3.7 mEq/L (ref 3.5–5.1)
Sodium: 139 mEq/L (ref 135–145)

## 2013-01-18 LAB — ABO/RH: ABO/RH(D): O POS

## 2013-01-18 LAB — CBC WITH DIFFERENTIAL/PLATELET
Basophils Relative: 0 % (ref 0–1)
Eosinophils Relative: 4 % (ref 0–5)
Hemoglobin: 13.3 g/dL (ref 12.0–15.0)
Lymphocytes Relative: 55 % — ABNORMAL HIGH (ref 12–46)
Monocytes Relative: 5 % (ref 3–12)
Neutrophils Relative %: 36 % — ABNORMAL LOW (ref 43–77)
Platelets: 336 10*3/uL (ref 150–400)
RBC: 4.43 MIL/uL (ref 3.87–5.11)
WBC: 11.9 10*3/uL — ABNORMAL HIGH (ref 4.0–10.5)

## 2013-01-18 LAB — URINALYSIS, ROUTINE W REFLEX MICROSCOPIC
Glucose, UA: NEGATIVE mg/dL
Hgb urine dipstick: NEGATIVE
Ketones, ur: 15 mg/dL — AB
Protein, ur: NEGATIVE mg/dL
Urobilinogen, UA: 0.2 mg/dL (ref 0.0–1.0)

## 2013-01-18 LAB — ETHANOL: Alcohol, Ethyl (B): <11 mg/dL (ref 0–11)

## 2013-01-18 MED ORDER — MORPHINE SULFATE 2 MG/ML IJ SOLN
INTRAMUSCULAR | Status: AC
  Filled 2013-01-18: qty 3

## 2013-01-18 MED ORDER — MORPHINE SULFATE 4 MG/ML IJ SOLN
6.0000 mg | Freq: Once | INTRAMUSCULAR | Status: AC
  Administered 2013-01-18: 6 mg via INTRAVENOUS

## 2013-01-18 MED ORDER — MORPHINE SULFATE 4 MG/ML IJ SOLN
INTRAMUSCULAR | Status: AC
  Filled 2013-01-18: qty 2

## 2013-01-18 MED ORDER — HYDROMORPHONE HCL PF 1 MG/ML IJ SOLN
1.0000 mg | Freq: Once | INTRAMUSCULAR | Status: AC
  Administered 2013-01-18: 1 mg via INTRAVENOUS
  Filled 2013-01-18: qty 1

## 2013-01-18 MED ORDER — ONDANSETRON HCL 4 MG/2ML IJ SOLN
4.0000 mg | Freq: Once | INTRAMUSCULAR | Status: AC
  Administered 2013-01-18: 4 mg via INTRAVENOUS

## 2013-01-18 MED ORDER — MORPHINE SULFATE 4 MG/ML IJ SOLN
6.0000 mg | Freq: Once | INTRAMUSCULAR | Status: AC
  Administered 2013-01-18: 6 mg via INTRAVENOUS
  Filled 2013-01-18: qty 2

## 2013-01-18 MED ORDER — MORPHINE SULFATE 4 MG/ML IJ SOLN: 6.0000 mg | Freq: Once | INTRAMUSCULAR | Status: DC

## 2013-01-18 MED ORDER — SODIUM CHLORIDE 0.9 % IV BOLUS (SEPSIS)
1000.0000 mL | Freq: Once | INTRAVENOUS | Status: AC
  Administered 2013-01-18: 1000 mL via INTRAVENOUS

## 2013-01-18 MED ORDER — ONDANSETRON HCL 4 MG/2ML IJ SOLN
INTRAMUSCULAR | Status: AC
  Filled 2013-01-18: qty 2

## 2013-01-18 MED ORDER — IOHEXOL 300 MG/ML  SOLN
100.0000 mL | Freq: Once | INTRAMUSCULAR | Status: AC | PRN
  Administered 2013-01-18: 100 mL via INTRAVENOUS

## 2013-01-18 MED ORDER — TETANUS-DIPHTH-ACELL PERTUSSIS 5-2.5-18.5 LF-MCG/0.5 IM SUSP
0.5000 mL | Freq: Once | INTRAMUSCULAR | Status: AC
  Administered 2013-01-18: 0.5 mL via INTRAMUSCULAR
  Filled 2013-01-18: qty 0.5

## 2013-01-18 NOTE — ED Notes (Signed)
[facility] bedside with ortho tech, applying splints for transfer.

## 2013-01-18 NOTE — ED Notes (Signed)
Report called to [facility] ED charge RN

## 2013-01-18 NOTE — Progress Notes (Signed)
Orthopedic Tech Progress Note Patient Details:  Robin [name] 28-Jul-1955 [id number]  Ortho Devices Type of Ortho Device: Ace wrap;Knee Immobilizer;Post (short leg) splint;Sugartong splint Ortho Device/Splint Location: right arm/leg Ortho Device/Splint Interventions: Application   Robin [name] 01/18/2013, 8:59 PM

## 2013-01-18 NOTE — Progress Notes (Signed)
Orthopedic Tech Progress Note Patient Details:  Robin [name] [birth year] [id number]  Patient ID: Archie Balboa, female   DOB: 10-Aug-1955, 57 y.o.   MRN: [id number] As ordered by Dr. Seward Carol, Tyleah Loh 01/18/2013, 9:00 PM
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
## 2013-01-18 NOTE — ED Notes (Addendum)
Dr Patria Mane aware pt getting no relief from pain with morphine, requested dilaudid.
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
## 2013-01-22 LAB — PATHOLOGIST SMEAR REVIEW

## 2013-01-23 MED FILL — Hydromorphone HCl Preservative Free (PF) Inj 2 MG/ML: INTRAMUSCULAR | Qty: 1 | Status: AC

## 2013-01-25 ENCOUNTER — Ambulatory Visit: Payer: Self-pay | Admitting: Family Medicine

## 2013-01-30 ENCOUNTER — Ambulatory Visit: Payer: Self-pay | Admitting: Family Medicine

## 2013-02-05 ENCOUNTER — Ambulatory Visit: Payer: Self-pay

## 2013-03-14 ENCOUNTER — Encounter: Payer: Self-pay | Admitting: Gastroenterology

## 2013-03-29 DIAGNOSIS — S82143A Displaced bicondylar fracture of unspecified tibia, initial encounter for closed fracture: Secondary | ICD-10-CM

## 2013-03-29 HISTORY — DX: Displaced bicondylar fracture of unspecified tibia, initial encounter for closed fracture: S82.143A

## 2013-06-20 ENCOUNTER — Encounter: Payer: Self-pay | Admitting: Family Medicine

## 2013-06-20 ENCOUNTER — Ambulatory Visit (INDEPENDENT_AMBULATORY_CARE_PROVIDER_SITE_OTHER): Payer: BC Managed Care – PPO | Admitting: Family Medicine

## 2013-06-20 VITALS — BP 152/84 | HR 90 | Temp 98.3°F | Wt 126.0 lb

## 2013-06-20 DIAGNOSIS — Z5189 Encounter for other specified aftercare: Secondary | ICD-10-CM

## 2013-06-20 DIAGNOSIS — T148XXA Other injury of unspecified body region, initial encounter: Secondary | ICD-10-CM

## 2013-06-20 DIAGNOSIS — Z23 Encounter for immunization: Secondary | ICD-10-CM

## 2013-06-20 DIAGNOSIS — J309 Allergic rhinitis, unspecified: Secondary | ICD-10-CM

## 2013-06-20 DIAGNOSIS — E782 Mixed hyperlipidemia: Secondary | ICD-10-CM

## 2013-06-20 DIAGNOSIS — T07XXXA Unspecified multiple injuries, initial encounter: Secondary | ICD-10-CM

## 2013-06-20 DIAGNOSIS — S3500XA Unspecified injury of abdominal aorta, initial encounter: Secondary | ICD-10-CM | POA: Insufficient documentation

## 2013-06-20 DIAGNOSIS — S3500XD Unspecified injury of abdominal aorta, subsequent encounter: Secondary | ICD-10-CM

## 2013-06-20 DIAGNOSIS — E042 Nontoxic multinodular goiter: Secondary | ICD-10-CM

## 2013-06-20 DIAGNOSIS — J45909 Unspecified asthma, uncomplicated: Secondary | ICD-10-CM

## 2013-06-20 DIAGNOSIS — F17200 Nicotine dependence, unspecified, uncomplicated: Secondary | ICD-10-CM

## 2013-06-20 DIAGNOSIS — E119 Type 2 diabetes mellitus without complications: Secondary | ICD-10-CM

## 2013-06-20 DIAGNOSIS — I10 Essential (primary) hypertension: Secondary | ICD-10-CM

## 2013-06-20 HISTORY — DX: Unspecified multiple injuries, initial encounter: T07.XXXA

## 2013-06-20 LAB — POCT GLYCOSYLATED HEMOGLOBIN (HGB A1C): Hemoglobin A1C: 7.1

## 2013-06-20 MED ORDER — SIMVASTATIN 40 MG PO TABS: 40.0000 mg | ORAL_TABLET | Freq: Every day | ORAL | Status: DC

## 2013-06-20 NOTE — Assessment & Plan Note (Signed)
Not ready to quit due to increased stressors.

## 2013-06-20 NOTE — Assessment & Plan Note (Addendum)
Noted on imaging at [facility]. Noted on exam today.  Attempting to obtain records from nursing home to see if any workup has been initiated.

## 2013-06-20 NOTE — Assessment & Plan Note (Signed)
Need repeat lipid panel but patient may have had at nursing home. Refilled simvastatin and obtain records.
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
## 2013-06-20 NOTE — Assessment & Plan Note (Signed)
a1c reasonably well controlled at 7.1. Suspect trending up given reported elevated fasting CBG today of 170 and fact patient thinks they have been running higher lately. Last visit was <7. Will continue metformin and glipizide (will have patient in to confirm doses). Given log book and asked patient to follow up with me in a month.

## 2013-06-20 NOTE — Assessment & Plan Note (Signed)
Pain well controlled. Still has pain medicine, assume we will have to eventually take over medication handling. Will need to discuss with pain advisory committee when this issue arises.
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
## 2013-06-20 NOTE — Assessment & Plan Note (Signed)
Poorly controlled but patient is not sure what medications she is taking or what doses after getting out of nursing home. Med rec with nursing and will titrate up accordingly.

## 2013-06-26 ENCOUNTER — Ambulatory Visit (INDEPENDENT_AMBULATORY_CARE_PROVIDER_SITE_OTHER): Payer: BC Managed Care – PPO | Admitting: *Deleted

## 2013-06-26 DIAGNOSIS — Z79899 Other long term (current) drug therapy: Secondary | ICD-10-CM
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
## 2013-07-01 NOTE — Addendum Note (Signed)
Addended by: Shelva Majestic on: 07/01/2013 03:01 PM   Modules accepted: Orders

## 2013-07-01 NOTE — Progress Notes (Signed)
I have updated medications according to med review. Patient was supposed to request a blood pressure check at the time. Would ask for patient to come back for BP check sometime in the next week or two. Will ask blue team to set this up.

## 2013-07-01 NOTE — Progress Notes (Signed)
No up to date number in chart. Nanci Lakatos, Maryjo Rochester

## 2013-07-08 ENCOUNTER — Telehealth: Payer: Self-pay | Admitting: *Deleted

## 2013-07-08 NOTE — Telephone Encounter (Signed)
Robin [name] with physical therapy called to get verbal ok to follow up with pt in her home since leaving skilled nursing.  She would like to go out and make sure her home is safe to move around in since she uses a walker to get around currently.  She would also like to have a skilled nurse come out and evaluate the weeping of her legs for possible wound care.  Pt did finish her abx from the nursing home 06/23/2013 and was seeming to be getting better, but her legs are weeping worse now.  Verbal ok given by Dr. Durene Cal for this to be done.  Also informed [name] that pt has yet to make a follow up appt to see MD and she will remind her of this when she goes out to her home today. Jazmin Hartsell,CMA

## 2013-07-15 ENCOUNTER — Encounter: Payer: Self-pay | Admitting: Family Medicine

## 2013-07-15 ENCOUNTER — Ambulatory Visit (INDEPENDENT_AMBULATORY_CARE_PROVIDER_SITE_OTHER): Payer: BC Managed Care – PPO | Admitting: Family Medicine

## 2013-07-15 VITALS — BP 146/90 | HR 105 | Temp 98.8°F | Ht 64.0 in | Wt 130.0 lb

## 2013-07-15 DIAGNOSIS — E119 Type 2 diabetes mellitus without complications: Secondary | ICD-10-CM

## 2013-07-15 DIAGNOSIS — S81801A Unspecified open wound, right lower leg, initial encounter: Secondary | ICD-10-CM

## 2013-07-15 DIAGNOSIS — S81809A Unspecified open wound, unspecified lower leg, initial encounter: Secondary | ICD-10-CM

## 2013-07-15 DIAGNOSIS — S81009A Unspecified open wound, unspecified knee, initial encounter: Secondary | ICD-10-CM

## 2013-07-15 DIAGNOSIS — G894 Chronic pain syndrome: Secondary | ICD-10-CM

## 2013-07-15 DIAGNOSIS — S91009A Unspecified open wound, unspecified ankle, initial encounter: Secondary | ICD-10-CM

## 2013-07-15 MED ORDER — SIMVASTATIN 40 MG PO TABS: 40.0000 mg | ORAL_TABLET | Freq: Every day | ORAL | Status: DC

## 2013-07-15 MED ORDER — OXYMORPHONE HCL ER 5 MG PO TB12: 5.0000 mg | ORAL_TABLET | Freq: Two times a day (BID) | ORAL | Status: DC

## 2013-07-15 MED ORDER — METOPROLOL TARTRATE 50 MG PO TABS: 50.0000 mg | ORAL_TABLET | Freq: Two times a day (BID) | ORAL | Status: DC

## 2013-07-15 MED ORDER — METFORMIN HCL 1000 MG PO TABS: 1000.0000 mg | ORAL_TABLET | Freq: Two times a day (BID) | ORAL | Status: DC

## 2013-07-15 MED ORDER — MELOXICAM 7.5 MG PO TABS: 7.5000 mg | ORAL_TABLET | Freq: Every day | ORAL | Status: DC

## 2013-07-15 MED ORDER — OXYCODONE HCL 30 MG PO TABS: 30.0000 mg | ORAL_TABLET | Freq: Three times a day (TID) | ORAL | Status: DC | PRN

## 2013-07-15 MED ORDER — DOXYCYCLINE HYCLATE 100 MG PO TABS: 100.0000 mg | ORAL_TABLET | Freq: Two times a day (BID) | ORAL | Status: DC

## 2013-07-15 NOTE — Patient Instructions (Signed)
I am concerned that the top wound is infected and may be tracking down to the screw. I need you to see your orthopedic surgeon this week. Call me if you have difficulty setting this up. I am going to put you on antibiotics until you see them.   I will also try to work on your disability paperwork. Our policy is 2 weeks but I will try to have it done by the 10th if able.   We need to discuss the following when you do not have a more acute issue: Health Maintenance Due  Topic Date Due  . Ophthalmology Exam  07/23/1966  . Colonoscopy  07/23/2006  . Mammogram  09/22/2011  . Pap Smear  09/26/2011  . Lipid Panel  08/22/2012   Thanks, Dr. Yong Channel

## 2013-07-16 DIAGNOSIS — G894 Chronic pain syndrome: Secondary | ICD-10-CM | POA: Insufficient documentation

## 2013-07-16 DIAGNOSIS — S81809A Unspecified open wound, unspecified lower leg, initial encounter: Secondary | ICD-10-CM | POA: Insufficient documentation

## 2013-07-16 HISTORY — DX: Unspecified open wound, unspecified lower leg, initial encounter: S81.809A

## 2013-07-16 NOTE — Assessment & Plan Note (Addendum)
On ultrasound, there appears to be a tract down to bone and a fluid collection around one of the screws from patient's surgery. I am concerned about infection (potentially infected hardware and potential for septic joint although patient shows no signs/symptoms today) and have given patient 10 days of doxycycline for suppressive therapy and asked her to go this week to see her orthopedist. She is to call me if she has any difficulties with getting this appointment. Patient had excellent PT pulses and has follow up with vascular next week-regarding wound healing. Also, diabetes with last a1c <8.   Originally had planned for CT but was concerned about results being skewed by hardware present in adjacent area.
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
## 2013-07-16 NOTE — Assessment & Plan Note (Signed)
I did refill short term the narcotics the patient was on at the nursing home. I did receive records and I intend to review these. Patient reported having to see pain specialist. She reports she still is in pain even with her high dose medicines. I am concerned that she may not be an appropriate candidate for prolonged narcotic therapy at our clinic. Will plan on discussing with pain medicine advisory board.

## 2013-07-17 ENCOUNTER — Telehealth: Payer: Self-pay | Admitting: Family Medicine

## 2013-07-17 DIAGNOSIS — L089 Local infection of the skin and subcutaneous tissue, unspecified: Secondary | ICD-10-CM | POA: Insufficient documentation

## 2013-07-17 DIAGNOSIS — T148XXA Other injury of unspecified body region, initial encounter: Secondary | ICD-10-CM

## 2013-07-17 NOTE — Telephone Encounter (Signed)
Please let patient know I completed my portion of short term disability form. Placed in box up front for pick up.

## 2013-07-17 NOTE — Telephone Encounter (Signed)
Tried to call pt but line was busy.   No other numbers listed. Ryne Mctigue,CMA

## 2013-07-26 ENCOUNTER — Encounter: Payer: Self-pay | Admitting: Family Medicine

## 2013-07-26 ENCOUNTER — Telehealth: Payer: Self-pay | Admitting: Family Medicine

## 2013-07-26 ENCOUNTER — Encounter: Payer: Self-pay | Admitting: *Deleted

## 2013-07-26 NOTE — Telephone Encounter (Signed)
Tried calling pt again and emergency contact number was busy.  Will mail a letter. Arnold Kester,CMA

## 2013-07-26 NOTE — Telephone Encounter (Signed)
Attempted to call patient to check up on leg wound that required evaluation by Ortho at [facility]. No # is listed. Emergency contact # is busy.   Blue team-can you try back later today, if no response, please send letter for patient to update contact information.

## 2013-07-30 ENCOUNTER — Telehealth: Payer: Self-pay | Admitting: *Deleted

## 2013-07-30 ENCOUNTER — Telehealth: Payer: Self-pay | Admitting: Family Medicine

## 2013-07-30 NOTE — Telephone Encounter (Signed)
Blue team-just received message after 5pm. Can we call [facility] to verify they have only 15 mg in stock? If so, will you ask if they can take a verbal to change 30mg  to take 2 15mg  pills? If they will not take a verbal for this change, then we can write another Rx tomorrow.

## 2013-07-30 NOTE — Telephone Encounter (Signed)
Received call from Newell Coral at [facility].  Patient has been having signs/symptoms of depression--decreased appetite, staying in bed all the time, crying.  Does not have dx of depression and is not taking meds for this.  Has decreased mobility and uses "two canes" to walk.  Would like for [place] to call patient to schedule an appt to discuss possible depression.  Called patient and scheduled appt with Dr. Yong Channel for 07/31/13 at 2:00 pm.  Also, PT is scheduled to end in 1-2 weeks.  [place] would like verbal order to extend visits for one visit/week lasting 4-5 weeks.  Will route note to Dr. Yong Channel and call back.  Nolene Ebbs, RN

## 2013-07-30 NOTE — Telephone Encounter (Signed)
Thanks for scheduling!   Yes, please continue PT 4-5 more weeks.

## 2013-07-30 NOTE — Telephone Encounter (Signed)
Pt says nobody has oxycodone in stock.  She was only able to get 14 pills from a pharmacy.  They have 15 mg in stock. She would like prescription changed to 15 mg.  She needs her pain meds today. She has been taking tylenol and it is making her sick Please advise

## 2013-07-31 ENCOUNTER — Ambulatory Visit (INDEPENDENT_AMBULATORY_CARE_PROVIDER_SITE_OTHER): Payer: BC Managed Care – PPO | Admitting: Family Medicine

## 2013-07-31 ENCOUNTER — Encounter: Payer: Self-pay | Admitting: Family Medicine

## 2013-07-31 VITALS — BP 154/88 | HR 99 | Temp 98.6°F | Wt 127.0 lb

## 2013-07-31 DIAGNOSIS — G894 Chronic pain syndrome: Secondary | ICD-10-CM

## 2013-07-31 DIAGNOSIS — S81009A Unspecified open wound, unspecified knee, initial encounter: Secondary | ICD-10-CM

## 2013-07-31 DIAGNOSIS — S81809A Unspecified open wound, unspecified lower leg, initial encounter: Secondary | ICD-10-CM

## 2013-07-31 DIAGNOSIS — R4589 Other symptoms and signs involving emotional state: Secondary | ICD-10-CM

## 2013-07-31 DIAGNOSIS — E042 Nontoxic multinodular goiter: Secondary | ICD-10-CM

## 2013-07-31 DIAGNOSIS — S91009A Unspecified open wound, unspecified ankle, initial encounter: Secondary | ICD-10-CM

## 2013-07-31 DIAGNOSIS — E119 Type 2 diabetes mellitus without complications: Secondary | ICD-10-CM

## 2013-07-31 MED ORDER — OXYMORPHONE HCL ER 7.5 MG PO TB12: 7.5000 mg | ORAL_TABLET | Freq: Two times a day (BID) | ORAL | Status: DC

## 2013-07-31 MED ORDER — OXYCODONE HCL 15 MG PO TABS: 15.0000 mg | ORAL_TABLET | Freq: Four times a day (QID) | ORAL | Status: DC | PRN
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
## 2013-07-31 NOTE — Telephone Encounter (Signed)
Contacted [facility] in [place] (only pharmacy in chart).  They did not fill the #14 pt is talking about.  Spoke with MD, since she is coming in this afternoon we will talk to her at that time. [place], Salome Spotted

## 2013-07-31 NOTE — Telephone Encounter (Signed)
Has appointment today and will refill at that time.

## 2013-07-31 NOTE — Telephone Encounter (Signed)
Returned call to [facility] and gave verbal order to extend visits to additional 4-5 weeks per Dr. Yong Channel.  Call left on MD/Rx line--Amy from [facility] called regarding Rx for Oxycodone 30 mg--on national backorder and patient was only given #14 pills instead of #45.  Wants to know if Dr. Yong Channel can write new Rx for Oxycodone 15 mg and patient can take #2 tabs to equal 30 mg.   Will route request to Dr. Yong Channel.  Nolene Ebbs, RN

## 2013-08-01 DIAGNOSIS — R4589 Other symptoms and signs involving emotional state: Secondary | ICD-10-CM | POA: Insufficient documentation

## 2013-08-01 LAB — LDL CHOLESTEROL, DIRECT: Direct LDL: 111 mg/dL — ABNORMAL HIGH

## 2013-08-01 LAB — TSH: TSH: 0.425 u[IU]/mL (ref 0.350–4.500)

## 2013-08-01 LAB — T4, FREE: FREE T4: 1.31 ng/dL (ref 0.80–1.80)

## 2013-08-01 NOTE — Assessment & Plan Note (Signed)
TSH and free T4 were wnl. Did not find workup per nursing home. Will need to discuss ? Biopsy vs. Watchful waiting with patient in future.

## 2013-08-01 NOTE — Assessment & Plan Note (Signed)
Short term refill provided. F/u in 1 month. Hopeful to discuss with pain advisory board at that time. May need to go to pain medicine clinic. I slightly increased opana ER to 7.5 mg BID and gave patient option of taking 15-30mg  instead of always taking 30mg  of oxycodone q6 hours.

## 2013-08-01 NOTE — Assessment & Plan Note (Signed)
Concern for Depression This sounds like a 3rd episode of depression but patient is very resistant to this idea. She does nto want to to go a "shrink" or counselor or anyone. Provided list as when I continued to talk to patient she admitted she could benefit from talking about her difficult situation. She is convinced her mood is due to being out of pain medicine. I have refilled her pain medicines at this time. She opts to f/u with nurse visit in 2 weeks for phq9 and if still elevated agrees to seek aid of medication (celexa or prozac at 20mg  unless patient has preference). She will consider calling one of mental health providers noted on handout.   PHQ9 15-19: Moderately Severe Depression-discussed need for medication and counseling/CBT. Patient in agreement to consider these once back on pain medicine. If patient opts for therapy-  Will follow up q 7-10 days until improving then on 2-4 week basis using PHQ9. If starts medication-Regarding medication, safety (SI), tolerability (SE), and efficacy (repeat PHQ9)  at next visit and titrate up as needed.  Regarding SI, patient denies.   -MDQ not indicated -Will obtain baseline labs (TSH, CBC, CMP) not listed above within last year. Also check t4 with history of thyroid nodules -if patient opts for antidepressant, will need baseline EKG  -EKG in July 2014 without QT prolongation (in case starts antidepressants) -List of psychotherapy providers recommended through [employer] or psychologytoday.com

## 2013-08-01 NOTE — Assessment & Plan Note (Signed)
Patient was seen by ortho. They tried to probe with end of q tip (stick end) and could not find a tract. Regular dressing changes advised.

## 2013-08-02 ENCOUNTER — Other Ambulatory Visit: Payer: Self-pay | Admitting: Family Medicine

## 2013-08-02 ENCOUNTER — Telehealth: Payer: Self-pay | Admitting: Family Medicine

## 2013-08-02 ENCOUNTER — Encounter: Payer: Self-pay | Admitting: Family Medicine

## 2013-08-02 NOTE — Telephone Encounter (Signed)
Patient called to update me. At last visit with orthopedist was seen by NP (patient had previously told me that she was told it was not infected as they tried to probe the wound and could find no tracts but no imaging was performed). In f/u visit, was seen by MD who x-rayed the leg and agreed with our previous diagnosis of infected hardware. The patient is to be hospitalized for 4 days next week with cleanout and later will have prolonged antibiotics ordered. I thanked patient for updating me.

## 2013-08-02 NOTE — Telephone Encounter (Signed)
Since pt is requesting callback later this afternoon, and I will not be available to call, will forward to MD. Clinton Sawyer, Salome Spotted

## 2013-08-02 NOTE — Telephone Encounter (Signed)
Pt called and would like Dr. Yong Channel to call her later today. She said that she is having surgery. jw

## 2013-08-14 ENCOUNTER — Ambulatory Visit: Payer: Self-pay | Admitting: Family Medicine

## 2013-08-15 ENCOUNTER — Telehealth: Payer: Self-pay | Admitting: Family Medicine

## 2013-08-15 NOTE — Telephone Encounter (Signed)
Husband brought in form for Dr Yong Channel to complete. He did not know what the form was about

## 2013-08-16 NOTE — Telephone Encounter (Signed)
Completed form and given to Latina Craver, RN

## 2013-08-16 NOTE — Telephone Encounter (Signed)
Placed in MDs box. Robin [name] Dawn  

## 2013-08-19 ENCOUNTER — Telehealth: Payer: Self-pay | Admitting: *Deleted

## 2013-08-19 NOTE — Telephone Encounter (Signed)
Pt informed that forms are completed and ready for pick up.  Pt asked about next appt.  Appt 08/20/2013 at 1:45 PM with Dr. Yong Channel.  Pt will pick up forms at appt.  Derl Barrow, RN

## 2013-08-20 ENCOUNTER — Telehealth: Payer: Self-pay | Admitting: Family Medicine

## 2013-08-20 ENCOUNTER — Ambulatory Visit: Payer: Self-pay | Admitting: Family Medicine

## 2013-08-20 MED ORDER — MELOXICAM 7.5 MG PO TABS: 7.5000 mg | ORAL_TABLET | Freq: Every day | ORAL | Status: DC

## 2013-08-20 MED ORDER — LISINOPRIL 40 MG PO TABS: 40.0000 mg | ORAL_TABLET | Freq: Every day | ORAL | Status: DC

## 2013-08-20 MED ORDER — GLIPIZIDE 10 MG PO TABS: 10.0000 mg | ORAL_TABLET | Freq: Two times a day (BID) | ORAL | Status: DC

## 2013-08-20 MED ORDER — METOPROLOL TARTRATE 50 MG PO TABS: 50.0000 mg | ORAL_TABLET | Freq: Two times a day (BID) | ORAL | Status: DC

## 2013-08-20 MED ORDER — METFORMIN HCL 1000 MG PO TABS: 1000.0000 mg | ORAL_TABLET | Freq: Two times a day (BID) | ORAL | Status: DC

## 2013-08-20 NOTE — Telephone Encounter (Signed)
Pt called and had an appointment 2/10 for med refill with Dr. Yong Channel, she will need refills on the following medications: Meloxicam, Metoprolol, Lisinopril, Glipizide, Metformin, Oxycodone, and Opana ER. Robin [name]

## 2013-08-20 NOTE — Telephone Encounter (Signed)
I am out sick so cannot refill narcotics. Last given 07/31/13 and a months worth. I will provide upon my return to clinic later this week. Please inform patient. Thanks [facility] team!

## 2013-08-20 NOTE — Telephone Encounter (Signed)
LM for pt to call back.  Please give message from Dr. Yong Channel when she does. Thanks [employer]

## 2013-08-21 ENCOUNTER — Telehealth: Payer: Self-pay | Admitting: Family Medicine

## 2013-08-21 NOTE — Telephone Encounter (Signed)
Dan Europe from [facility] called to advise that patient's BP was 185/95 at rest today. She was not able to do PT today due to BP.

## 2013-08-22 NOTE — Telephone Encounter (Signed)
Same response as below.  Pt has appt on 08/27/2013 with PCP. [place], Salome Spotted

## 2013-08-22 NOTE — Telephone Encounter (Signed)
No number listed for Dan Europe and tried to reach pt but number was busy.  Jazmin Hartsell,CMA

## 2013-08-22 NOTE — Telephone Encounter (Signed)
If patient has a cuff at home, please get her to check daily. Please also make sure she is taking lisinopril and metoprolol as prescribed. We may need to stop the Meloxicam as this can increase blood pressure.   If she continues to have blood pressures above 140/90 then we will add HCTZ 12.5mg  to her regimen. If she checks it and above 160/90 for more than 2 days, then I will start this medication by phone.

## 2013-08-26 ENCOUNTER — Telehealth: Payer: Self-pay | Admitting: Family Medicine

## 2013-08-26 NOTE — Telephone Encounter (Signed)
Planned to call patient. No numbers listed. Emergency contact busy.   My message for the patient is the following: I will not be providing refills for oxycodone at this time as she just received this from orthopedist. She will need an office visit before she runs out. We will be working on weaning her off the medication completely.   Other notes: Patient received #45 of oxycodone 30mg  on 07/17/13 at [facility] and #45 of opana ER 5mg .  [facility] with [facility] and patient received #80 of 15mg  oxycodone to be used every 6 hours on 07/31/13.  She did not pick up the opana ER 7.5 mg. The pharmacy stated the patient stated she left the RX at the pharmacy but there were no records.  On 2/12 patient received script from orthopedist at [facility] for 15mg  oxycodone #60 to be used q 6 hours.  Patient was told at that time that she would need to be off by 3 months. It was mentioned that right knee would likely need total replacement at some point. She did recently have an Irrigation and debridement of the right leg.

## 2013-08-26 NOTE — Telephone Encounter (Signed)
My message for the patient is the following: I will not be providing refills for oxycodone at this time as she just received this from orthopedist. She will need an office visit before she runs out. We will be working on weaning her off the medication completely.

## 2013-08-27 ENCOUNTER — Ambulatory Visit: Payer: Self-pay | Admitting: Family Medicine

## 2013-08-28 ENCOUNTER — Ambulatory Visit: Payer: Self-pay | Admitting: Family Medicine

## 2013-09-04 ENCOUNTER — Ambulatory Visit (INDEPENDENT_AMBULATORY_CARE_PROVIDER_SITE_OTHER): Payer: BC Managed Care – PPO | Admitting: Family Medicine

## 2013-09-04 VITALS — BP 166/86 | HR 80 | Temp 98.5°F | Wt 131.0 lb

## 2013-09-04 DIAGNOSIS — G894 Chronic pain syndrome: Secondary | ICD-10-CM

## 2013-09-04 DIAGNOSIS — R4589 Other symptoms and signs involving emotional state: Secondary | ICD-10-CM

## 2013-09-04 DIAGNOSIS — I10 Essential (primary) hypertension: Secondary | ICD-10-CM

## 2013-09-04 MED ORDER — HYDROCHLOROTHIAZIDE 12.5 MG PO CAPS: 12.5000 mg | ORAL_CAPSULE | Freq: Every day | ORAL | Status: DC

## 2013-09-04 NOTE — Patient Instructions (Signed)
I am glad your symptoms of depression have improved. Please let me know if this changes.   Blood Pressure  I am adding hydrochlorothiazide 12.5 mg to your regimen. Keep taking lisinopril and metoprolol.   We will recheck blood pressure next week (may need to take 25mg ).    Chronic Pain  Please see me at my next available to discuss a plan for weaning you off of your current medications.   We will also discuss other potential therapies to help reduce pain as coming off narcotics.   Thanks for your time,  Dr. Yong Channel  P.S. Give me a call with your meter type so I can see strips. I need to see you mid march to follow up on diabetes.

## 2013-09-06 NOTE — Assessment & Plan Note (Signed)
Extensive discussion with patient about the fact that narcotics would not offer longterm benefit for her pain and she would likely have increasing needs over time. Discussed need to wean to off within 3 months as described by orthopedist. Patient already taking gabapentin which she thinks is not helping. COuld consider a medication such as lyrica but will discuss further at next visit. We will have a visit specifically to discuss and map out her wean within 1-2 weeks. Advised patient she CANNOT take more medication than is prescribed in regards to narcotics. She should begin taking 15mg  instead of 30mg  at a time.   >50% of 40 minute office visit was spent on counseling and coordination of care

## 2013-09-06 NOTE — Assessment & Plan Note (Signed)
Patient states now that pain is better controlled symptoms have resolved. PHQ9 today was 2. Will continue to monitor.

## 2013-09-06 NOTE — Assessment & Plan Note (Signed)
Poorly controlled systolic for 3rd visit in a row. Added HCTZ 12.5 mg today with follow up in next 1-2 weeks and may need to make full 25mg . Discussed CCB but patient is concerned about edema. Still may be next step if not well controlled.
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
## 2013-09-09 ENCOUNTER — Ambulatory Visit: Payer: Self-pay | Admitting: Family Medicine

## 2013-09-24 ENCOUNTER — Ambulatory Visit (INDEPENDENT_AMBULATORY_CARE_PROVIDER_SITE_OTHER): Payer: BC Managed Care – PPO | Admitting: Family Medicine

## 2013-09-24 ENCOUNTER — Encounter: Payer: Self-pay | Admitting: Family Medicine

## 2013-09-24 VITALS — BP 148/78 | HR 78 | Temp 98.8°F | Ht 64.0 in | Wt 128.0 lb

## 2013-09-24 DIAGNOSIS — I10 Essential (primary) hypertension: Secondary | ICD-10-CM

## 2013-09-24 DIAGNOSIS — E119 Type 2 diabetes mellitus without complications: Secondary | ICD-10-CM

## 2013-09-24 DIAGNOSIS — G894 Chronic pain syndrome: Secondary | ICD-10-CM

## 2013-09-24 LAB — COMPREHENSIVE METABOLIC PANEL
ALK PHOS: 114 U/L (ref 39–117)
ALT: 27 U/L (ref 0–35)
AST: 16 U/L (ref 0–37)
Albumin: 4 g/dL (ref 3.5–5.2)
BUN: 13 mg/dL (ref 6–23)
CO2: 31 meq/L (ref 19–32)
Calcium: 9.9 mg/dL (ref 8.4–10.5)
Chloride: 105 mEq/L (ref 96–112)
Creat: 0.71 mg/dL (ref 0.50–1.10)
GLUCOSE: 347 mg/dL — AB (ref 70–99)
POTASSIUM: 4.5 meq/L (ref 3.5–5.3)
SODIUM: 141 meq/L (ref 135–145)
TOTAL PROTEIN: 6.8 g/dL (ref 6.0–8.3)
Total Bilirubin: 0.2 mg/dL (ref 0.2–1.2)

## 2013-09-24 LAB — POCT GLYCOSYLATED HEMOGLOBIN (HGB A1C): HEMOGLOBIN A1C: 7.9

## 2013-09-24 MED ORDER — OXYCODONE-ACETAMINOPHEN 5-325 MG PO TABS: 1.0000 | ORAL_TABLET | Freq: Three times a day (TID) | ORAL | Status: DC | PRN

## 2013-09-24 MED ORDER — HYDROCHLOROTHIAZIDE 25 MG PO TABS: 25.0000 mg | ORAL_TABLET | Freq: Every day | ORAL | Status: DC

## 2013-09-24 MED ORDER — PROPRANOLOL HCL 40 MG PO TABS: 40.0000 mg | ORAL_TABLET | Freq: Two times a day (BID) | ORAL | Status: DC

## 2013-09-24 NOTE — Assessment & Plan Note (Signed)
Improved but remains poorly controlled. Will increase HCTZ to 25mg . Also switch metoprolol to propranolol as precepted with Dr. Nori Riis and mentioned may be some benefit in chronic pain syndrome (although admittedly evidence is weak).

## 2013-09-24 NOTE — Patient Instructions (Addendum)
Blood Pressure  Increase your HCTZ to 25mg  from 12.5mg .   Started you on propranolol (INSTEAD OF METOPROLOL) to see if this will help some with pain  Continue lisinopril  Kidneys  Check kidney function today  Please stop taking aleve/ibuprofen/naproxen/etc while taking Mobic as this can damage your kidneys  Diabetes  A1c creeping up to 7.9  Call in the name of your strips to me  Make your changes in diet to help control this  Chronic Pain  Will refer you to pain management specialist  #60 of percocet given today, will allow 1 refill by phone before visit to pain management  Keep taking gabapeintin  Thanks, Dr Yong Channel  Once you are no longer dealing with upcoming surgeries: Health Maintenance Due  Topic Date Due  . Colonoscopy  07/23/2006  . Mammogram  09/22/2011  . Pap Smear  09/26/2011   For your labs, I will send you a letter if there are no medication changes needed. I will call you if we need to discuss your lab results.  Orders Placed This Encounter  Procedures  . Comprehensive metabolic panel  . Ambulatory referral to Pain Clinic    Referral Priority:  Routine    Referral Type:  Consultation    Referral Reason:  Specialty Services Required    Requested Specialty:  Pain Medicine    Number of Visits Requested:  1  . POCT glycosylated hemoglobin (Hb A1C)

## 2013-09-24 NOTE — Assessment & Plan Note (Addendum)
Given atypical nature of pain (diffuse, sensitivity to skin) ? Of possibility of complex  pain syndrome (thought technically would be diffuse and not regional). I am pleased patient was able to come off 30 mg oxycodone. I have given her a short term amount of percocet and will provide 1 refill. I have also referred her to pain management for further evaluation given this may be long term issue that can benefit from their expertise. If any issues with pain management clinic, could consider adding nortriptyline to gabapentin or topical capsaicin cream or short term steroids. Also try propranolol for HTN to see if any benefit.

## 2013-09-24 NOTE — Assessment & Plan Note (Signed)
a1c worsened to 7.9 from 7.1. Patient admits due to dietary changes/spluges when getting out of nursing home. She plans to make needed changes and follow up in 3 months. She will also have me call in strips once she determines her meter type.
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
## 2013-09-30 ENCOUNTER — Encounter: Payer: Self-pay | Admitting: Family Medicine

## 2013-10-10 ENCOUNTER — Telehealth: Payer: Self-pay | Admitting: Family Medicine

## 2013-10-10 NOTE — Telephone Encounter (Signed)
Pt's dropped off form to be filled out regarding work related disability.

## 2013-10-14 NOTE — Telephone Encounter (Signed)
Clinical portion filled out, placed in MD box for completion.

## 2013-10-14 NOTE — Telephone Encounter (Signed)
Will complete within 2 weeks as is standard for forms.

## 2013-10-15 ENCOUNTER — Ambulatory Visit (INDEPENDENT_AMBULATORY_CARE_PROVIDER_SITE_OTHER): Payer: BC Managed Care – PPO | Admitting: Family Medicine

## 2013-10-15 ENCOUNTER — Encounter: Payer: Self-pay | Admitting: Family Medicine

## 2013-10-15 VITALS — BP 147/84 | HR 80 | Temp 98.4°F | Wt 128.0 lb

## 2013-10-15 DIAGNOSIS — R21 Rash and other nonspecific skin eruption: Secondary | ICD-10-CM

## 2013-10-15 MED ORDER — DIPHENHYDRAMINE HCL 50 MG PO TABS: 25.0000 mg | ORAL_TABLET | Freq: Four times a day (QID) | ORAL | Status: DC | PRN

## 2013-10-15 NOTE — Progress Notes (Signed)
Subjective:     Patient ID: Robin [name], female   DOB: 09-01-1955, 58 y.o.   MRN: [id number]  HPI 58 y.o. F here for evaluation of rash. Non itchy, no burning. Spreading. Noticed last Wednesday on neck and upper chest. Over the weekend it spread to her body, back and very few on lower extremity.   May have used too much detergent last week but usual detergent No known exposure, no hx of bug bites. Partner without rash  No n/v, no f/c, no cough, no UR sx, no issues with restroom.   Pt denies recent illness. No exposure to children  Recently started percocet but did not start propranolol  Pt has been taking benadryl with no improvement over the weekend.  Review of Systems     Objective:   Physical Exam Filed Vitals:   10/15/13 1210  BP: 147/84  Pulse: 80  Temp: 98.4 F (36.9 C)   VSS NAD Diffuse papular rash across chest and back. Few lesions on lower and upper extremities. None on face.      Assessment:       Papular dermatits:  Viral xanthem vs drug reaction - recently started percocet. Recommend avoid percocet and try APAP instead.  - Benadryl 25mg  q6hrs scheduled - return in 1 week for reevaluation - no steroids at this time, diffuse rash and non pruritic.

## 2013-10-21 ENCOUNTER — Telehealth: Payer: Self-pay | Admitting: *Deleted

## 2013-10-21 NOTE — Telephone Encounter (Signed)
Form completed and given to Latina Craver, RN.

## 2013-10-21 NOTE — Telephone Encounter (Signed)
Pt informed that work related disability forms are completed and ready for pick up.  Forms copied to be scanned.  Derl Barrow, RN

## 2013-10-30 ENCOUNTER — Encounter: Payer: Self-pay | Admitting: Family Medicine

## 2013-10-30 ENCOUNTER — Ambulatory Visit (INDEPENDENT_AMBULATORY_CARE_PROVIDER_SITE_OTHER): Payer: BC Managed Care – PPO | Admitting: Family Medicine

## 2013-10-30 VITALS — BP 122/64 | HR 86 | Temp 98.3°F | Ht 64.0 in | Wt 127.0 lb

## 2013-10-30 DIAGNOSIS — I10 Essential (primary) hypertension: Secondary | ICD-10-CM

## 2013-10-30 DIAGNOSIS — G894 Chronic pain syndrome: Secondary | ICD-10-CM

## 2013-10-30 MED ORDER — METOPROLOL TARTRATE 50 MG PO TABS: 50.0000 mg | ORAL_TABLET | Freq: Two times a day (BID) | ORAL | Status: DC

## 2013-10-30 NOTE — Patient Instructions (Signed)
Blood pressure looks MUCH better-you can keep taking metoprolol (do not need to take propranolol), lisinopril and new HCTZ.   Pain- I am so thrilled for you that your pain got so much better with removal of the metal in your leg. Please let me know in the future if you want [place] to resubmit to pain clinic but I am hopeful you will not need this.   When you are recovered: Health Maintenance Due  Topic Date Due  . Colonoscopy  07/23/2006  . Mammogram  09/22/2011  . Pap Smear  09/26/2011   Follow up for diabetes in about 2 months,  Dr. Yong Channel

## 2013-10-31 NOTE — Progress Notes (Signed)
Garret Reddish, MD Phone: [phone]  Subjective:   Robin [name] is a 58 y.o. year old very pleasant female patient who presents with the following:  Hypertension BP Readings from Last 3 Encounters:  10/30/13 122/64  10/15/13 147/84  09/24/13 148/78  Home BP monitoring-yes Compliant with medications-yes without side effects including recent addition of HCTZ, is still taking metoprolol instead of propranolol ROS-Denies any chest pain or shortness of breath, mild LE edema on right leg that recently had surgery  Chronic Pain Patient previously with diffuse pain described as "blanket of pain" around whole body that was worse with body being touched. Pain most severe in bilateral legs. Patient states after she recently had surgery to remove hardware in right leg due to infection, her pain drastically improved. She cancelled her pain management appointment as a result of the improvement. Mainly has pain near right ankle where she still has hardware. She is only taking percocet 7.5/325 and this will be reduced to 5/325 ROS- no unintentional weight loss. No fever/chills.   Past Medical History- Patient Active Problem List   Diagnosis Date Noted  . Chronic pain syndrome 07/16/2013    Priority: High  . Multiple thyroid nodules 06/20/2013    Priority: High  . Multiple fractures 06/20/2013    Priority: Medium  . Abdominal aorta injury 06/20/2013    Priority: Medium  . HYPERLIPIDEMIA 04/18/2007    Priority: Medium  . DM w/o Complication Type II 27/74/1287    Priority: Medium  . TOBACCO DEPENDENCE 09/07/2006    Priority: Medium  . HYPERTENSION, BENIGN SYSTEMIC 09/07/2006    Priority: Medium  . Pes planus 12/06/2011    Priority: Low  . Injury of left shoulder 04/21/2011    Priority: Low  . RHINITIS, ALLERGIC 09/07/2006    Priority: Low  . Non-suicidal depressed mood 08/01/2013   Medications- reviewed and updated Current Outpatient Prescriptions  Medication Sig  Dispense Refill  . oxyCODONE-acetaminophen (PERCOCET) 7.5-325 MG per tablet Take 1 tablet by mouth.      Marland Kitchen acetaminophen (TYLENOL ARTHRITIS PAIN) 650 MG CR tablet Take 650 mg by mouth every 6 (six) hours as needed.        Marland Kitchen aspirin 81 MG chewable tablet Chew 81 mg by mouth daily.        . diphenhydrAMINE (BENADRYL) 50 MG tablet Take 0.5 tablets (25 mg total) by mouth every 6 (six) hours as needed for itching or allergies.  60 tablet  0  . fluticasone (FLONASE) 50 MCG/ACT nasal spray Place into both nostrils daily.      Marland Kitchen gabapentin (NEURONTIN) 800 MG tablet Take 800 mg by mouth 3 (three) times daily. For pain      . glipiZIDE (GLUCOTROL) 10 MG tablet Take 1 tablet (10 mg total) by mouth 2 (two) times daily before a meal.  60 tablet  5  . glucose blood (FREESTYLE LITE) test strip Check blood sugars daily before breakfast       . glucose monitoring kit (FREESTYLE) monitoring kit Check blood sugars daily before breakfast       . hydrochlorothiazide (HYDRODIURIL) 25 MG tablet Take 1 tablet (25 mg total) by mouth daily.  90 tablet  3  . Lancets (FREESTYLE) lancets Use daily before breakfast.       . lisinopril (PRINIVIL,ZESTRIL) 40 MG tablet Take 1 tablet (40 mg total) by mouth daily.  30 tablet  5  . meloxicam (MOBIC) 7.5 MG tablet Take 1 tablet (7.5 mg total) by mouth daily.  30 tablet  2  . metFORMIN (GLUCOPHAGE) 1000 MG tablet Take 1 tablet (1,000 mg total) by mouth 2 (two) times daily with a meal.  60 tablet  5  . metoprolol (LOPRESSOR) 50 MG tablet Take 1 tablet (50 mg total) by mouth 2 (two) times daily.  60 tablet  5  . oxyCODONE-acetaminophen (PERCOCET/ROXICET) 5-325 MG per tablet Take 1 tablet by mouth every 8 (eight) hours as needed for severe pain.  60 tablet  0  . simvastatin (ZOCOR) 40 MG tablet Take 1 tablet (40 mg total) by mouth daily.  30 tablet  prn  . Vitamin D, Ergocalciferol, (DRISDOL) 50000 UNITS CAPS capsule Take 50,000 Units by mouth every 7 (seven) days.       No current  facility-administered medications for this visit.    Objective: BP 122/64  Pulse 86  Temp(Src) 98.3 F (36.8 C) (Oral)  Ht _0  (1.626 m)  Wt 127 lb (57.607 kg)  BMI 21.79 kg/m2  LMP 08/11/2011 Gen: NAD, resting comfortably, walks slowly CV: RRR no murmurs rubs or gallops Lungs: CTAB no crackles, wheeze, rhonchi Ext: Right leg with slightly draining wounds (patient discussed with surgical team recently) over recent surgical site, 1+ edema right leg at ankle.   Assessment/Plan:  Chronic pain syndrome Greatly improved after removal of hardware. Continue prn medications from orthopedics. Patient cancelled pain medicine appointment, could consider to reorder this if pain recurrent in future. ? If reaction to hardware.  HYPERTENSION, BENIGN SYSTEMIC Well controlled. Continue current meds: metoprolol, lisinopril, hctz     Meds ordered this encounter  Medications  . metoprolol (LOPRESSOR) 50 MG tablet    Sig: Take 1 tablet (50 mg total) by mouth 2 (two) times daily.    Dispense:  60 tablet    Refill:  5   Patient defers until after healed from recent surgery Health Maintenance Due  Topic Date Due  . Colonoscopy  07/23/2006  . Mammogram  09/22/2011  . Pap Smear  09/26/2011

## 2013-10-31 NOTE — Assessment & Plan Note (Signed)
Greatly improved after removal of hardware. Continue prn medications from orthopedics. Patient cancelled pain medicine appointment, could consider to reorder this if pain recurrent in future. ? If reaction to hardware.

## 2013-10-31 NOTE — Assessment & Plan Note (Signed)
Well controlled. Continue current meds: metoprolol, lisinopril, hctz

## 2013-11-12 ENCOUNTER — Telehealth: Payer: Self-pay | Admitting: Family Medicine

## 2013-11-12 MED ORDER — FREESTYLE LANCETS MISC: Status: DC

## 2013-11-12 NOTE — Telephone Encounter (Signed)
Pt called and needs needles called in. The hospital did not call them in with the rest of her supplies. jw

## 2013-11-12 NOTE — Telephone Encounter (Signed)
Will forward to MD. Jazmin Hartsell,CMA  

## 2013-11-12 NOTE — Telephone Encounter (Signed)
Refilled lancets (assume that's what she meant by needles). Please inform patient.

## 2013-11-18 ENCOUNTER — Encounter: Payer: Self-pay | Admitting: Family Medicine

## 2013-11-18 ENCOUNTER — Ambulatory Visit (INDEPENDENT_AMBULATORY_CARE_PROVIDER_SITE_OTHER): Payer: BC Managed Care – PPO | Admitting: Family Medicine

## 2013-11-18 VITALS — BP 134/78 | HR 88 | Temp 99.2°F | Wt 129.0 lb

## 2013-11-18 DIAGNOSIS — S82143A Displaced bicondylar fracture of unspecified tibia, initial encounter for closed fracture: Secondary | ICD-10-CM

## 2013-11-18 DIAGNOSIS — L089 Local infection of the skin and subcutaneous tissue, unspecified: Secondary | ICD-10-CM

## 2013-11-18 DIAGNOSIS — S82109A Unspecified fracture of upper end of unspecified tibia, initial encounter for closed fracture: Secondary | ICD-10-CM

## 2013-11-18 DIAGNOSIS — T148XXA Other injury of unspecified body region, initial encounter: Secondary | ICD-10-CM

## 2013-11-18 DIAGNOSIS — E119 Type 2 diabetes mellitus without complications: Secondary | ICD-10-CM

## 2013-11-18 MED ORDER — GLIPIZIDE 10 MG PO TABS: 10.0000 mg | ORAL_TABLET | Freq: Two times a day (BID) | ORAL | Status: DC

## 2013-11-18 MED ORDER — INSULIN PEN NEEDLE 32G X 6 MM MISC: Status: DC

## 2013-11-18 NOTE — Patient Instructions (Addendum)
Diabetes  Check your blood sugar 3x a day (before breakfast, lunch, and dinner)  Call me 3 days after you start doing this (3 days worth of logs)  I will tell you how much Lantus to take at that time  Keep taking metformin and glipizide for now  Hold off on buying Novolog until we get your Lantus at the right dose  Lantus can be stored 28 days once opened not in fridge, before opening, always keep refridgerated  I have ordered: Meds ordered this encounter  . Insulin Pen Needle 32G X 6 MM MISC    Sig: Inject Lantus once daily in the evening    Dispense:  100 each    Refill:  prn    Let's check in 2-3 weeks from now, Dr. Yong Channel

## 2013-11-19 NOTE — Progress Notes (Signed)
Garret Reddish, MD Phone: [phone]  Subjective:   Robin [name] is a 58 y.o. year old very pleasant female patient who presents for hospital follow up. Patient was recently hospitalized for a chronically infected right leg wound at site of prior fracture repair. She underwent irrigation and debridement. SHe had PICC line placed and ID was consulted. Would cultures grew serratia and enterococcus. SHe is to receive 6 weeks of vacomycin through the PICC line and ciprofloxacin PO. She currentlyhas home care nurses coordinating this. Labs are being sent to ID at [facility].  She has been compliant with this therapy. CBG control was deemed essential and patient was started on Lantus 25 units with novolog 8 units before meals. She was taken off of metformin and glipizide. Patient did not feel comfortable with diabetic teaching or supplies provided and has not been using any insulin. In fact, she does not even have a meter and has not been checking her CBGs. She is still taking her metformin and glipizide. SHe expects to get a meter later this week. Patient currently is awaiting a new meter, has Lantus but nothing to draw it up or inject it. She has had her insulin out and has never refrigerated it but it has not been in extreme hot or cold.   ROS- no fever/chills. WOund drainage is decreased compared to prior surgery. No expanding redness from surgical site. Has pain around site of surgery at 5-6/10.   Past Medical History- Patient Active Problem List   Diagnosis Date Noted  . Chronic pain syndrome 07/16/2013    Priority: High  . Multiple thyroid nodules 06/20/2013    Priority: High  . Multiple fractures 06/20/2013    Priority: Medium  . Abdominal aorta injury 06/20/2013    Priority: Medium  . HYPERLIPIDEMIA 04/18/2007    Priority: Medium  . DM w/o Complication Type II 73/41/9379    Priority: Medium  . TOBACCO DEPENDENCE 09/07/2006    Priority: Medium  . HYPERTENSION, BENIGN  SYSTEMIC 09/07/2006    Priority: Medium  . Pes planus 12/06/2011    Priority: Low  . Injury of left shoulder 04/21/2011    Priority: Low  . RHINITIS, ALLERGIC 09/07/2006    Priority: Low  . Non-suicidal depressed mood 08/01/2013  . Infected wound 07/17/2013  . Fracture of tibial plateau 03/29/2013   Medications- reviewed and updated Current Outpatient Prescriptions  Medication Sig Dispense Refill  . acetaminophen (TYLENOL ARTHRITIS PAIN) 650 MG CR tablet Take 650 mg by mouth every 6 (six) hours as needed.        Marland Kitchen aspirin 81 MG chewable tablet Chew 81 mg by mouth daily.        . diphenhydrAMINE (BENADRYL) 50 MG tablet Take 0.5 tablets (25 mg total) by mouth every 6 (six) hours as needed for itching or allergies.  60 tablet  0  . fluticasone (FLONASE) 50 MCG/ACT nasal spray Place into both nostrils daily.      Marland Kitchen gabapentin (NEURONTIN) 800 MG tablet Take 800 mg by mouth 3 (three) times daily. For pain      . glipiZIDE (GLUCOTROL) 10 MG tablet Take 1 tablet (10 mg total) by mouth 2 (two) times daily before a meal.  60 tablet  5  . glucose blood (FREESTYLE LITE) test strip Check blood sugars daily before breakfast       . glucose monitoring kit (FREESTYLE) monitoring kit Check blood sugars daily before breakfast       . hydrochlorothiazide (HYDRODIURIL) 25 MG tablet Take  1 tablet (25 mg total) by mouth daily.  90 tablet  3  . Insulin Pen Needle 32G X 6 MM MISC Inject Lantus once daily in the evening  100 each  prn  . Lancets (FREESTYLE) lancets Use daily before breakfast.  100 each  prn  . lisinopril (PRINIVIL,ZESTRIL) 40 MG tablet Take 1 tablet (40 mg total) by mouth daily.  30 tablet  5  . metFORMIN (GLUCOPHAGE) 1000 MG tablet Take 1 tablet (1,000 mg total) by mouth 2 (two) times daily with a meal.  60 tablet  5  . metoprolol (LOPRESSOR) 50 MG tablet Take 1 tablet (50 mg total) by mouth 2 (two) times daily.  60 tablet  5  . oxyCODONE-acetaminophen (PERCOCET/ROXICET) 5-325 MG per tablet  Take 1 tablet by mouth every 8 (eight) hours as needed for severe pain.  60 tablet  0  . simvastatin (ZOCOR) 40 MG tablet Take 1 tablet (40 mg total) by mouth daily.  30 tablet  prn  . Vitamin D, Ergocalciferol, (DRISDOL) 50000 UNITS CAPS capsule Take 50,000 Units by mouth every 7 (seven) days.       No current facility-administered medications for this visit.    Objective: BP 134/78  Pulse 88  Temp(Src) 99.2 F (37.3 C) (Oral)  Wt 129 lb (58.514 kg)  LMP 08/11/2011 Gen: NAD, resting comfortably in wheelchair CV: RRR no murmurs rubs or gallops Lungs: CTAB no crackles, wheeze, rhonchi Ext: no edema, right leg wrapped with gauze, inspection of site reveals healing well with some serosanguinous drainage Skin: warm, dry Neuro: grossly normal, moves all extremities  Assessment/Plan:  Infected wound S/p 2nd irrigation and debridement. Now on IV vanc for 6 weeks due to enterococcus and cipro due to serratia. Patient compliant with therapy. Has follow up planned with orthopedics and ID at [facility]. Stressed importance of compliance of medication.   DM w/o Complication Type II D7A may have been increased due to infection. Hopeful treatment of infection will improve control. I agree that patient may need insulin but unfortunately without any current CBGs cannot titrate. Plan would be to titrate up Lantus to fastings from 90-130 and then assess mealtime CBGs on glipizide and metformin. Possible may not need novolog but did require in hospital. Patient may require diabetic teaching but does at least seem to feel comfortable with Lantus after a prolonged discussion today. Patient to call me in 3 days after getting new meter for insulin titration.    Meds ordered this encounter  Medications  . DISCONTD: Insulin Pen Needle 32G X 6 MM MISC    Sig: Inject Lantus once daily in the evening    Dispense:  199 each    Refill:  prn  . Insulin Pen Needle 32G X 6 MM MISC    Sig: Inject Lantus once daily in  the evening    Dispense:  100 each    Refill:  prn  . glipiZIDE (GLUCOTROL) 10 MG tablet    Sig: Take 1 tablet (10 mg total) by mouth 2 (two) times daily before a meal.    Dispense:  60 tablet    Refill:  5

## 2013-11-19 NOTE — Assessment & Plan Note (Signed)
S/p 2nd irrigation and debridement. Now on IV vanc for 6 weeks due to enterococcus and cipro due to serratia. Patient compliant with therapy. Has follow up planned with orthopedics and ID at [facility]. Stressed importance of compliance of medication.

## 2013-11-19 NOTE — Assessment & Plan Note (Signed)
a1c may have been increased due to infection. Hopeful treatment of infection will improve control. I agree that patient may need insulin but unfortunately without any current CBGs cannot titrate. Plan would be to titrate up Lantus to fastings from 90-130 and then assess mealtime CBGs on glipizide and metformin. Possible may not need novolog but did require in hospital. Patient may require diabetic teaching but does at least seem to feel comfortable with Lantus after a prolonged discussion today. Patient to call me in 3 days after getting new meter for insulin titration.

## 2013-11-21 ENCOUNTER — Other Ambulatory Visit: Payer: Self-pay | Admitting: Family Medicine

## 2013-11-21 ENCOUNTER — Telehealth: Payer: Self-pay | Admitting: Family Medicine

## 2013-11-21 MED ORDER — "INSULIN SYRINGE 31G X 5/16"" 0.3 ML MISC": Status: DC

## 2013-11-21 NOTE — Telephone Encounter (Signed)
Had accidentally ordered pen needles instead of syringes. Corrected. Confirmed order with pharmacy. I informed patient.

## 2013-11-21 NOTE — Telephone Encounter (Signed)
Pt called because the wrong needles were call in. She said that she needs the needles that have little things on that that tell you how much medication you are injecting into yourself. jw

## 2013-11-27 ENCOUNTER — Telehealth: Payer: Self-pay | Admitting: Family Medicine

## 2013-11-27 MED ORDER — OXYCODONE-ACETAMINOPHEN 5-325 MG PO TABS: 1.0000 | ORAL_TABLET | Freq: Three times a day (TID) | ORAL | Status: DC | PRN

## 2013-11-27 NOTE — Telephone Encounter (Signed)
Available at front desk. I called patient to inform and asked her to let me know if she is about to run out such as at her office visit within 9 days of this request.

## 2013-11-27 NOTE — Telephone Encounter (Signed)
Needs refill on percocet.   °

## 2013-11-29 ENCOUNTER — Telehealth: Payer: Self-pay | Admitting: Family Medicine

## 2013-11-29 DIAGNOSIS — E119 Type 2 diabetes mellitus without complications: Secondary | ICD-10-CM

## 2013-11-29 MED ORDER — GLUCOSE BLOOD VI STRP: ORAL_STRIP | Status: DC

## 2013-11-29 NOTE — Telephone Encounter (Signed)
Lancets were called in 5/5 and are not meter specific. I have refilled strips once again. Please inform patient.

## 2013-11-29 NOTE — Telephone Encounter (Signed)
Patient requesting refills for glucometer test strips and lancets. She has a [employer].

## 2013-11-29 NOTE — Telephone Encounter (Signed)
Pt is aware.  Robin [name],CMA  

## 2013-12-03 ENCOUNTER — Encounter: Payer: Self-pay | Admitting: Family Medicine

## 2013-12-03 NOTE — Progress Notes (Signed)
MD in clinic.  Will forward to him. Robin [name]

## 2013-12-03 NOTE — Progress Notes (Signed)
Form dropped off to be filled out for disability.  Please call when completed.

## 2013-12-04 ENCOUNTER — Telehealth: Payer: Self-pay | Admitting: *Deleted

## 2013-12-04 MED ORDER — ONETOUCH LANCETS MISC: Status: DC

## 2013-12-04 NOTE — Addendum Note (Signed)
Addended by: Boykin Nearing on: 12/04/2013 03:21 PM   Modules accepted: Orders

## 2013-12-04 NOTE — Telephone Encounter (Signed)
Sent in lancets  

## 2013-12-04 NOTE — Telephone Encounter (Signed)
Pt called stating she has not checked blood sugar today and needed the here lancet.  Pt stated that the freestyle lancet prescribed does not work with her new meter.  The new lancets that are needed are One Touch Verio.  Pt checks blood glucose three times a day.  Derl Barrow, RN

## 2013-12-04 NOTE — Telephone Encounter (Signed)
Lancets are interchangeable as far as use. She can use the freestyle that was ordered on 11/12/13.

## 2013-12-04 NOTE — Telephone Encounter (Signed)
Received fax from [facility], [place] requesting a new Rx for One Touch Verio lancets. Pt using One Touch meter.  Derl Barrow, RN

## 2013-12-05 NOTE — Progress Notes (Signed)
FOrm completed and submitted to Latina Craver, RN

## 2013-12-06 NOTE — Progress Notes (Signed)
Pt informed that form is completed and ready for pick up.  Form copied for scanning.  Derl Barrow, RN

## 2013-12-11 ENCOUNTER — Telehealth: Payer: Self-pay | Admitting: Family Medicine

## 2013-12-11 NOTE — Telephone Encounter (Signed)
Pt called to let Dr. Yong Channel know her blood test results. The average is for 14 days and 16 results avg 215. For 7 days and 6 results avg is 244. jw

## 2013-12-12 NOTE — Telephone Encounter (Signed)
Called patient to discuss her home CBG's. She states that in the last week, her am fasting glucose has been in the 240's. She has the lantus available but did not know how much to take. She is taking glipizide 10 and metformin 1000mg  bid.  Will start her on lantus 5units in the morning. Will start more conservatively to avoid hypoglycemia. Instructed patient to check her CBG in the am and if CBG>150, to add 1 unit of lantus to the total dose. She was able to express that back to me.  If CBG<70, drink juice and recheck cbg in 10min-1hr. If occurs more than once, patient to call office and let us know.  Patient to make an appointment with Dr. Yong Channel in 2 weeks.   Liam Graham, PGY-3 Family Medicine Resident

## 2013-12-24 ENCOUNTER — Telehealth: Payer: Self-pay | Admitting: Family Medicine

## 2013-12-24 NOTE — Telephone Encounter (Signed)
Discussed lantus use up to 8 units as she was titrating up with orthopedics help. Had CBG of 69 this AM. Discussed 1 unit titration down option but ultimately went down to 5 units with planned follow up Monday and patient to call if she is having any further lows. She can continue glipizide and metformin.

## 2013-12-24 NOTE — Telephone Encounter (Signed)
Please call about patient's concern to take her insulin when her numbers are in the 80's and 60's

## 2013-12-30 ENCOUNTER — Ambulatory Visit (INDEPENDENT_AMBULATORY_CARE_PROVIDER_SITE_OTHER): Payer: BC Managed Care – PPO | Admitting: Family Medicine

## 2013-12-30 ENCOUNTER — Encounter: Payer: Self-pay | Admitting: Family Medicine

## 2013-12-30 VITALS — BP 114/75 | HR 101 | Temp 97.5°F | Ht 64.0 in | Wt 128.0 lb

## 2013-12-30 DIAGNOSIS — E119 Type 2 diabetes mellitus without complications: Secondary | ICD-10-CM

## 2013-12-30 LAB — POCT GLYCOSYLATED HEMOGLOBIN (HGB A1C): HEMOGLOBIN A1C: 8

## 2013-12-30 NOTE — Assessment & Plan Note (Signed)
Poorly controlled by a1c of 8.0 but this 3 month time frame also represents a time of infection when CBGs were much higher. Patient is experiencing hypoglycemia on 7 units of Lantus intermittently. I have discussed once again cutting back to 5 units (had discussed 5 units last week) and she will hold if CBG <95 and will need to discuss if occurs regularly. If CBG regularly above 150 in AM, will call to discuss titration but asked her not to increase on her own. Patient knows how to manage hypoglycemia when occurs. Mistakenly thought patient had foot exam in last year but will need at next visit. Patient to return in 1 month to meet new doctor and to follow closely due to poorly controlled a1c yet with recent hypoglycemia.

## 2013-12-30 NOTE — Patient Instructions (Signed)
Diabetes  Cut back Lantus to 5 units. Do not go up unless we discuss by phone (indicator being blood sugar in the morning above 150 regularly)  If blood sugar under 95, do not take Lantus  Continue glipizide and metformin  See [place] back in 1 month to check and meet your new doctor  I will miss you and it has been a pleasure getting to be your doctor, Dr. Yong Channel
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
## 2014-01-06 NOTE — Addendum Note (Signed)
Addended by: Marin Olp on: 01/06/2014 11:56 AM   Modules accepted: Orders

## 2014-01-08 ENCOUNTER — Encounter: Payer: Self-pay | Admitting: Family Medicine

## 2014-01-08 NOTE — Progress Notes (Signed)
Patient dropped off form to be filled out for disability and for a work excuse.  Please call her when completed.

## 2014-01-09 ENCOUNTER — Encounter: Payer: Self-pay | Admitting: Family Medicine

## 2014-01-09 ENCOUNTER — Telehealth: Payer: Self-pay | Admitting: Family Medicine

## 2014-01-09 MED ORDER — OXYCODONE-ACETAMINOPHEN 5-325 MG PO TABS: 1.0000 | ORAL_TABLET | Freq: Three times a day (TID) | ORAL | Status: DC | PRN

## 2014-01-09 NOTE — Telephone Encounter (Signed)
Needs percocet refilled -

## 2014-01-09 NOTE — Progress Notes (Signed)
Placed in Dr. Chipper Oman box. Robin [name], Robin [name]

## 2014-01-09 NOTE — Telephone Encounter (Signed)
Refill percocet 5-325 #60 left at front desk. Please call patient and let her know. -DrW

## 2014-01-13 NOTE — Telephone Encounter (Signed)
Pt informed. Robin [name]  

## 2014-01-13 NOTE — Progress Notes (Signed)
Pt informed that disabiltiy form is completed and work note will not be completed until closer to start start date 02/25/14.  Pt stated she has to have that work note turned in by 02/06/2014. Appt made for 01/15/2014 at 9:30 AM with PCP per pt request.  Derl Barrow, RN

## 2014-01-15 ENCOUNTER — Ambulatory Visit (INDEPENDENT_AMBULATORY_CARE_PROVIDER_SITE_OTHER): Payer: BC Managed Care – PPO | Admitting: Family Medicine

## 2014-01-15 ENCOUNTER — Encounter: Payer: Self-pay | Admitting: Family Medicine

## 2014-01-15 VITALS — BP 102/71 | HR 86 | Temp 98.3°F | Ht 64.0 in | Wt 126.0 lb

## 2014-01-15 DIAGNOSIS — T798XXD Other early complications of trauma, subsequent encounter: Secondary | ICD-10-CM

## 2014-01-15 DIAGNOSIS — L089 Local infection of the skin and subcutaneous tissue, unspecified: Secondary | ICD-10-CM

## 2014-01-15 DIAGNOSIS — T148XXA Other injury of unspecified body region, initial encounter: Secondary | ICD-10-CM

## 2014-01-15 DIAGNOSIS — Z5189 Encounter for other specified aftercare: Secondary | ICD-10-CM

## 2014-01-15 DIAGNOSIS — T07XXXA Unspecified multiple injuries, initial encounter: Secondary | ICD-10-CM
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
## 2014-01-15 NOTE — Assessment & Plan Note (Signed)
No concerns from patient today; she is still non-weight bearing pending clearance by ortho; may need future repeat surgery. Pain controlled today.

## 2014-02-13 ENCOUNTER — Encounter: Payer: Self-pay | Admitting: Family Medicine

## 2014-02-13 NOTE — Progress Notes (Signed)
Pt dropped off forms to be filled out regarding short term disability.

## 2014-02-13 NOTE — Progress Notes (Signed)
Placed in MDs box. Fleeger, Jessica Dawn  

## 2014-02-14 NOTE — Progress Notes (Signed)
Short term disability form filled out and left at front desk. Please call patient and let her know, thank you. -Dr Lamar Benes

## 2014-02-14 NOTE — Progress Notes (Signed)
Pt informed. Fleeger, Jessica Dawn  

## 2014-02-24 ENCOUNTER — Ambulatory Visit (INDEPENDENT_AMBULATORY_CARE_PROVIDER_SITE_OTHER): Payer: BC Managed Care – PPO | Admitting: Family Medicine

## 2014-02-24 ENCOUNTER — Encounter: Payer: Self-pay | Admitting: Family Medicine

## 2014-02-24 ENCOUNTER — Ambulatory Visit (HOSPITAL_COMMUNITY)
Admission: RE | Admit: 2014-02-24 | Discharge: 2014-02-24 | Disposition: A | Discharge status: Home / Self Care | Payer: BC Managed Care – PPO | Source: Ambulatory Visit | Attending: Family Medicine | Admitting: Family Medicine

## 2014-02-24 VITALS — BP 129/80 | HR 80 | Temp 98.3°F | Wt 126.0 lb

## 2014-02-24 DIAGNOSIS — R300 Dysuria: Secondary | ICD-10-CM | POA: Diagnosis not present

## 2014-02-24 DIAGNOSIS — R000 Tachycardia, unspecified: Secondary | ICD-10-CM | POA: Insufficient documentation

## 2014-02-24 DIAGNOSIS — N952 Postmenopausal atrophic vaginitis: Secondary | ICD-10-CM | POA: Diagnosis not present

## 2014-02-24 DIAGNOSIS — I479 Paroxysmal tachycardia, unspecified: Secondary | ICD-10-CM

## 2014-02-24 DIAGNOSIS — B373 Candidiasis of vulva and vagina: Secondary | ICD-10-CM

## 2014-02-24 DIAGNOSIS — Z5189 Encounter for other specified aftercare: Secondary | ICD-10-CM

## 2014-02-24 DIAGNOSIS — T148XXA Other injury of unspecified body region, initial encounter: Secondary | ICD-10-CM

## 2014-02-24 DIAGNOSIS — T798XXD Other early complications of trauma, subsequent encounter: Secondary | ICD-10-CM

## 2014-02-24 DIAGNOSIS — B3731 Acute candidiasis of vulva and vagina: Secondary | ICD-10-CM

## 2014-02-24 DIAGNOSIS — L089 Local infection of the skin and subcutaneous tissue, unspecified: Secondary | ICD-10-CM

## 2014-02-24 LAB — POCT URINALYSIS DIPSTICK
BILIRUBIN UA: NEGATIVE
Glucose, UA: NEGATIVE
Ketones, UA: NEGATIVE
Leukocytes, UA: NEGATIVE
Nitrite, UA: NEGATIVE
PH UA: 5.5
Protein, UA: NEGATIVE
RBC UA: NEGATIVE
Spec Grav, UA: 1.02
Urobilinogen, UA: 0.2

## 2014-02-24 MED ORDER — ESTROGENS, CONJUGATED 0.625 MG/GM VA CREA: 0.5000 g | TOPICAL_CREAM | Freq: Every day | VAGINAL | Status: DC

## 2014-02-24 MED ORDER — OXYCODONE-ACETAMINOPHEN 5-325 MG PO TABS: 1.0000 | ORAL_TABLET | Freq: Three times a day (TID) | ORAL | Status: DC | PRN

## 2014-02-24 MED ORDER — FLUCONAZOLE 150 MG PO TABS: 150.0000 mg | ORAL_TABLET | Freq: Once | ORAL | Status: DC

## 2014-02-24 NOTE — Assessment & Plan Note (Signed)
Self treated by patient with OTC monostat. Did not examine today due to patient declining. She is at an increased risk for yeast infections given her prolonged antibiotics. Also complaining of urinary odor, no other symptoms of UTI. Plan: rx for diflucan x 1 to be taken after completing cipro course in 5 days. UA today was normal. F/u for exam if still having symptoms.

## 2014-02-24 NOTE — Assessment & Plan Note (Signed)
Did not examine today per patient request. By report patient is now menopausal and complaining of vaginal irritation/dryness.  Plan: will treat with premarin cream, if not improved will need to come back to be examined (patient verbalized and understood need for exam).

## 2014-02-24 NOTE — Patient Instructions (Addendum)
For your vaginal irritation: This is likely related to going through menopause, where your ovaries no longer produce the estrogen hormone needed to keep the vaginal walls healthy. One of the treatments for this is topical estrogen cream. Start by using 0.5g a day for 2 weeks, then decrease to twice weekly. If you are not getting sufficient relief of symptoms, you can increase this to 3 times a week. During intercourse, make sure to use extra lubrication as well.  For possible yeast infection: The antibiotics you are on are more likely to cause a yeast infection. You can use the over the counter Monostat, but I will give you a pill called diflucan to take after you complete your antibiotics course. This is a one time dose.  For your heart: We got an EKG today which was normal. We will have you do continuous monitoring to see if we can catch one of these episodes to see what is exactly going on. The monitor is put on at a cardiologist's office.

## 2014-02-24 NOTE — Assessment & Plan Note (Signed)
Intermittent symptoms by history. Has chest pains but sounds more pleuritic than cardiac in origin. EKG today was normal sinus at rate of 80 with no evidence of arrhythmia or ischemia. Patient recently stopped taking metoprolol for unclear reason (last PCP visit with Dr Yong Channel on 10/30/13 states to continue metoprolol) Plan: EKG today, setup 48 hour holter monitoring. F/u in 1-2 weeks after Holter has been completed.
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
## 2014-02-24 NOTE — Assessment & Plan Note (Signed)
Completed IV antibiotics, still has ciprofloxacin Plan: deferred management to ID/ortho.

## 2014-02-26 ENCOUNTER — Other Ambulatory Visit: Payer: Self-pay | Admitting: Family Medicine

## 2014-02-28 ENCOUNTER — Encounter (INDEPENDENT_AMBULATORY_CARE_PROVIDER_SITE_OTHER): Payer: BC Managed Care – PPO

## 2014-02-28 ENCOUNTER — Encounter: Payer: Self-pay | Admitting: Radiology

## 2014-02-28 ENCOUNTER — Other Ambulatory Visit: Payer: Self-pay | Admitting: Family Medicine

## 2014-02-28 DIAGNOSIS — R000 Tachycardia, unspecified: Secondary | ICD-10-CM

## 2014-02-28 NOTE — Progress Notes (Signed)
Patient ID: Robin [name], female   DOB: 26-Sep-1955, 58 y.o.   MRN: [id number] E cardio 48hr holter applied to pt.

## 2014-03-05 ENCOUNTER — Other Ambulatory Visit: Payer: Self-pay | Admitting: Family Medicine

## 2014-03-05 ENCOUNTER — Telehealth: Payer: Self-pay | Admitting: Family Medicine

## 2014-03-05 DIAGNOSIS — E119 Type 2 diabetes mellitus without complications: Secondary | ICD-10-CM

## 2014-03-05 MED ORDER — GLIPIZIDE 10 MG PO TABS: 10.0000 mg | ORAL_TABLET | Freq: Two times a day (BID) | ORAL | Status: DC

## 2014-03-05 MED ORDER — LISINOPRIL 40 MG PO TABS: 40.0000 mg | ORAL_TABLET | Freq: Every day | ORAL | Status: DC

## 2014-03-05 NOTE — Telephone Encounter (Signed)
Needs refills on gliazide and lisinopril

## 2014-03-05 NOTE — Telephone Encounter (Signed)
Refills sent for glipizide, lisinopril. -Dr Lamar Benes

## 2014-03-14 ENCOUNTER — Encounter: Payer: Self-pay | Admitting: Family Medicine

## 2014-03-14 NOTE — Progress Notes (Signed)
Disability form dropped off to be filled out.  Please call when completed.

## 2014-03-18 ENCOUNTER — Encounter: Payer: Self-pay | Admitting: Family Medicine

## 2014-03-18 NOTE — Progress Notes (Signed)
Form for [employer] from [employer] [phone], fax [phone]) filled out for patient. Copy was made of this documentation and left in the "to be scanned" work queue. Original documentation to be faxed to the above fax number. -Dr. Lamar Benes

## 2014-03-18 NOTE — Progress Notes (Signed)
Given to MD as he is in clinic to day. Robin [name], Robin [name]

## 2014-03-18 NOTE — Progress Notes (Signed)
Pt informed that form is completed and ready for pick up.  Form copied for scanning in pt's record.  Derl Barrow, RN

## 2014-03-24 ENCOUNTER — Ambulatory Visit: Payer: Self-pay | Admitting: Family Medicine

## 2014-03-25 ENCOUNTER — Encounter: Payer: Self-pay | Admitting: Family Medicine

## 2014-03-25 NOTE — Progress Notes (Signed)
Plan of care form faxed back to [place] that needs to be signed by [facility] certified physician.  Was signed by Dr. Lamar Benes originally.  Please fax when completed.  Placed in Dr. McDiarmid's box.

## 2014-03-26 NOTE — Progress Notes (Signed)
Patient ID: Robin [name], female   DOB: [birth year], 58 y.o.   MRN: [id number] Form signed and put in box to be fax'd.

## 2014-03-31 ENCOUNTER — Encounter: Payer: Self-pay | Admitting: Family Medicine

## 2014-03-31 ENCOUNTER — Ambulatory Visit (INDEPENDENT_AMBULATORY_CARE_PROVIDER_SITE_OTHER): Payer: BC Managed Care – PPO | Admitting: Family Medicine

## 2014-03-31 VITALS — BP 147/72 | HR 83 | Temp 98.6°F | Ht 64.0 in | Wt 129.0 lb

## 2014-03-31 DIAGNOSIS — E119 Type 2 diabetes mellitus without complications: Secondary | ICD-10-CM

## 2014-03-31 DIAGNOSIS — Z23 Encounter for immunization: Secondary | ICD-10-CM

## 2014-03-31 DIAGNOSIS — T148XXA Other injury of unspecified body region, initial encounter: Secondary | ICD-10-CM | POA: Diagnosis not present

## 2014-03-31 DIAGNOSIS — T07XXXA Unspecified multiple injuries, initial encounter: Secondary | ICD-10-CM

## 2014-03-31 DIAGNOSIS — I10 Essential (primary) hypertension: Secondary | ICD-10-CM | POA: Diagnosis not present

## 2014-03-31 LAB — POCT GLYCOSYLATED HEMOGLOBIN (HGB A1C): Hemoglobin A1C: 7.3

## 2014-03-31 MED ORDER — OXYCODONE-ACETAMINOPHEN 5-325 MG PO TABS: 1.0000 | ORAL_TABLET | Freq: Three times a day (TID) | ORAL | Status: DC | PRN

## 2014-03-31 MED ORDER — LISINOPRIL 40 MG PO TABS: 40.0000 mg | ORAL_TABLET | Freq: Every day | ORAL | Status: DC

## 2014-03-31 MED ORDER — GLIPIZIDE 10 MG PO TABS: 10.0000 mg | ORAL_TABLET | Freq: Two times a day (BID) | ORAL | Status: DC

## 2014-03-31 MED ORDER — METFORMIN HCL 1000 MG PO TABS: 1000.0000 mg | ORAL_TABLET | Freq: Two times a day (BID) | ORAL | Status: DC

## 2014-03-31 NOTE — Assessment & Plan Note (Signed)
Systolic slightly above goal today. No plan to change regimen at this visit, if still elevated may need to titrate medications at follow up.
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
## 2014-03-31 NOTE — Assessment & Plan Note (Signed)
Weight bearing on right lower leg. Off antibiotics. Has f/u with ortho in 1-2 months.  Plan: percocet refilled today, #60 refill 1.

## 2014-03-31 NOTE — Assessment & Plan Note (Signed)
A1c 7.3 today. Not taking lantus regularly, was going to restart in past week due to increased sugar intake but it was expired. Given her A1c and still having some symptomatic lows, would favor holding off on lantus (she was on low dose 5 units), however may consider refilling this with holding parameter to not take it if <120 units fasting in the morning. Plan: continue glipizide, f/u 3 months

## 2014-03-31 NOTE — Patient Instructions (Addendum)
Tegaderm is the name of the covering that would be waterproof to cover your leg.  Keep up the good work with your blood sugar, your A1c today is 7.3. Bring your meter and medications to next visit.

## 2014-04-14 ENCOUNTER — Encounter: Payer: Self-pay | Admitting: Family Medicine

## 2014-04-14 NOTE — Progress Notes (Signed)
Patient dropped off disability form to be filled out.  Please call her when completed.

## 2014-04-15 NOTE — Progress Notes (Signed)
Placed in MDs box. [name], Robin Dawn  

## 2014-04-16 ENCOUNTER — Encounter: Payer: Self-pay | Admitting: Family Medicine

## 2014-04-16 NOTE — Progress Notes (Signed)
Filled out and left with Tamika. 

## 2014-04-17 NOTE — Progress Notes (Addendum)
Pt informed that form is complete and ready for pick up. Forms copied for scanning in pt's record.  Derl Barrow, RN

## 2014-05-14 ENCOUNTER — Telehealth: Payer: Self-pay | Admitting: Family Medicine

## 2014-05-14 NOTE — Telephone Encounter (Signed)
Husband brought in form for disability Will pick up when ready

## 2014-05-16 NOTE — Telephone Encounter (Signed)
Placed in provider's box. Robin [name],CMA

## 2014-05-19 NOTE — Telephone Encounter (Signed)
Patient calls, states that today is the deadline for paperwork. Advised patient that paperwork was in MD's box waiting to completion. Please call patient asap for pick up.

## 2014-05-19 NOTE — Telephone Encounter (Signed)
Filled out and left with Tamika. Can you ask the patient to get these forms to [place] in a more timely manner since we know they are due each month? Thank you.

## 2014-05-19 NOTE — Telephone Encounter (Signed)
Pt informed that form is complete and ready for pick up.  Forms copied for scanning in pt's record.  Derl Barrow, RN

## 2014-05-19 NOTE — Telephone Encounter (Signed)
Will forward to MD to check on status. Jazmin Hartsell,CMA  

## 2014-06-04 ENCOUNTER — Other Ambulatory Visit: Payer: Self-pay | Admitting: Family Medicine

## 2014-06-04 DIAGNOSIS — G8929 Other chronic pain: Secondary | ICD-10-CM

## 2014-06-04 NOTE — Telephone Encounter (Signed)
Pt called and needs a refill on her pain medication left up front. Please call when ready for pickup . jw °

## 2014-06-09 DIAGNOSIS — G8929 Other chronic pain: Secondary | ICD-10-CM | POA: Insufficient documentation

## 2014-06-09 MED ORDER — OXYCODONE-ACETAMINOPHEN 5-325 MG PO TABS: 1.0000 | ORAL_TABLET | Freq: Three times a day (TID) | ORAL | Status: DC | PRN

## 2014-06-09 NOTE — Telephone Encounter (Signed)
Prescription refilled and left up front. Please call patient and let her know she can pick it up. -Dr. Lamar Benes

## 2014-06-09 NOTE — Telephone Encounter (Signed)
Pt is aware that rx is ready for pick up. Henry Utsey,CMA  

## 2014-06-16 ENCOUNTER — Encounter: Payer: Self-pay | Admitting: Family Medicine

## 2014-06-16 NOTE — Progress Notes (Signed)
Placed in MDs box. Issai Werling Dawn  

## 2014-06-16 NOTE — Progress Notes (Signed)
Patient dropped Disability Form for PCP's signature. Please follow up with Patient.

## 2014-06-18 NOTE — Progress Notes (Signed)
Form filled out and left with Tamika. When you call patient can you have her schedule a follow up to discuss making a referral for a full disability evaluation, which would benefit her since she may not be required to fill out the forms monthly. Thanks. -Dr. Lamar Benes

## 2014-06-19 NOTE — Progress Notes (Signed)
Called Pt; informed her that the form is complete and ready for pick up.  Pt advised to schedule an appt with PCP for next form completion.  Pt stated she has an upcoming appt with her Orthopedic Doctor 07/16/2014 and was going to make a follow up with PCP after that appt.  Form copied for scanning in pt's record.  Derl Barrow, RN

## 2014-07-14 ENCOUNTER — Other Ambulatory Visit: Payer: Self-pay | Admitting: Family Medicine

## 2014-07-14 NOTE — Telephone Encounter (Signed)
Ran out of pain med-percocet on Dec 31. Doesn't have an appt until 07-25-14 Please advise

## 2014-07-16 ENCOUNTER — Encounter: Payer: Self-pay | Admitting: Family Medicine

## 2014-07-16 NOTE — Telephone Encounter (Signed)
Will forward to MD.  Pt should have called for a refill and made an appt before running out of medication. Jamill Wetmore,CMA

## 2014-07-16 NOTE — Progress Notes (Signed)
Placed in provider's box for completion. Jazmin Hartsell,CMA  

## 2014-07-16 NOTE — Progress Notes (Signed)
Patient dropped Disability Form to be completed and signed by PCP. Please, follow up with Patient.

## 2014-07-16 NOTE — Telephone Encounter (Signed)
Pt called again and wants her percocet. She said that if she doesn't get this she will find another doctor and that she will be cussing Dr. Lamar Benes out because he doesn't know what kind a pain she is in.Please let her know when she can pick this up. jw

## 2014-07-17 MED ORDER — OXYCODONE-ACETAMINOPHEN 5-325 MG PO TABS: 1.0000 | ORAL_TABLET | Freq: Three times a day (TID) | ORAL | Status: DC | PRN

## 2014-07-17 NOTE — Progress Notes (Signed)
Left voice message for pt informing her of Rx and FMLA forms are ready for pick up.  FMLA forms copied for scanning in patient's record.  Derl Barrow, RN

## 2014-07-17 NOTE — Progress Notes (Signed)
Filled out and left at front desk along with prescription. -Dr. Lamar Benes

## 2014-07-25 ENCOUNTER — Encounter: Payer: Self-pay | Admitting: Family Medicine

## 2014-07-25 ENCOUNTER — Ambulatory Visit (INDEPENDENT_AMBULATORY_CARE_PROVIDER_SITE_OTHER): Payer: BC Managed Care – PPO | Admitting: Family Medicine

## 2014-07-25 VITALS — BP 122/79 | HR 80 | Temp 98.6°F | Ht 64.0 in | Wt 130.0 lb

## 2014-07-25 DIAGNOSIS — G8929 Other chronic pain: Secondary | ICD-10-CM

## 2014-07-25 DIAGNOSIS — T148 Other injury of unspecified body region: Secondary | ICD-10-CM

## 2014-07-25 DIAGNOSIS — M545 Low back pain, unspecified: Secondary | ICD-10-CM | POA: Insufficient documentation

## 2014-07-25 DIAGNOSIS — T07XXXA Unspecified multiple injuries, initial encounter: Secondary | ICD-10-CM

## 2014-07-25 DIAGNOSIS — G894 Chronic pain syndrome: Secondary | ICD-10-CM | POA: Diagnosis not present

## 2014-07-25 DIAGNOSIS — Z7189 Other specified counseling: Secondary | ICD-10-CM | POA: Diagnosis not present

## 2014-07-25 DIAGNOSIS — E119 Type 2 diabetes mellitus without complications: Secondary | ICD-10-CM | POA: Diagnosis not present

## 2014-07-25 LAB — POCT GLYCOSYLATED HEMOGLOBIN (HGB A1C): Hemoglobin A1C: 7.1

## 2014-07-25 MED ORDER — OXYCODONE-ACETAMINOPHEN 5-325 MG PO TABS: 1.0000 | ORAL_TABLET | Freq: Three times a day (TID) | ORAL | Status: DC | PRN

## 2014-07-25 MED ORDER — CYCLOBENZAPRINE HCL 10 MG PO TABS: 10.0000 mg | ORAL_TABLET | Freq: Three times a day (TID) | ORAL | Status: DC | PRN

## 2014-07-25 NOTE — Assessment & Plan Note (Signed)
Suspect musculoskeletal/strain. Only 1 day history of this. Treat conservatively with ice/heating pads, will also give rx for flexeril she can use if it does not improve over the next few days. No red flags, no urinary symptoms.

## 2014-07-25 NOTE — Assessment & Plan Note (Signed)
Well controlled A1c 7.1 today, improved from 7.3 in 03/2014. Continue current regimen.

## 2014-07-25 NOTE — Patient Instructions (Signed)
Your A1c was 7.1. Good job! Continue working on your diet (vegetables!). Follow up in 3 months or sooner if needed.
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
## 2014-07-25 NOTE — Assessment & Plan Note (Signed)
Has upcoming repeat CT of right leg for evaluation for infection. In discussion with her orthopedist for right total knee replacement. She has been having our clinic fill out short term disability forms monthly due to these issues. I did not discuss possible PT referral for disability evaluation today because it does sound like she will be (hopefully) undergoing a right total knee replacement.

## 2014-07-26 LAB — DRUG SCR UR, PAIN MGMT, REFLEX CONF
Amphetamine Screen, Ur: NEGATIVE
Barbiturate Quant, Ur: NEGATIVE
Benzodiazepines.: NEGATIVE
COCAINE METABOLITES: NEGATIVE
CREATININE, U: 100.55 mg/dL
Methadone: NEGATIVE
Opiates: NEGATIVE
Phencyclidine (PCP): NEGATIVE
Propoxyphene: NEGATIVE

## 2014-07-26 NOTE — Assessment & Plan Note (Signed)
Signed pain contract and UDS done today (pt reports marijuana use today in clinic). Refill for percocet #60 dated not to be filled before 08/17/14 given in office today. Encouraged to try ibuprofen or aleve rather than high dose aspirin for additional pain management.

## 2014-07-29 LAB — CANNABANOIDS (GC/LC/MS), URINE: THC-COOH (GC/LC/MS), ur confirm: 50 ng/mL — ABNORMAL HIGH (ref <5)

## 2014-08-18 ENCOUNTER — Encounter: Payer: Self-pay | Admitting: Family Medicine

## 2014-08-18 NOTE — Progress Notes (Unsigned)
Pt. Dropped off disability form to be completed; please call pt when ready for pick up. [phone] SR

## 2014-08-19 NOTE — Progress Notes (Signed)
LM for patient that form is ready for pick up. Draven Natter,CMA  

## 2014-09-16 ENCOUNTER — Telehealth: Payer: Self-pay | Admitting: Family Medicine

## 2014-09-16 NOTE — Telephone Encounter (Signed)
Patient dropped Disability Form to be completed and signed by PCP. Please,  Follow up with Patient.

## 2014-09-17 NOTE — Telephone Encounter (Signed)
LM for patient that paperwork is ready for pick up. Diya Gervasi,CMA

## 2014-09-29 ENCOUNTER — Other Ambulatory Visit: Payer: Self-pay | Admitting: Family Medicine

## 2014-09-29 DIAGNOSIS — G894 Chronic pain syndrome: Secondary | ICD-10-CM

## 2014-09-29 MED ORDER — SENNOSIDES-DOCUSATE SODIUM 8.6-50 MG PO TABS: 2.0000 | ORAL_TABLET | Freq: Every day | ORAL | Status: AC

## 2014-09-29 MED ORDER — OXYCODONE-ACETAMINOPHEN 5-325 MG PO TABS: 1.0000 | ORAL_TABLET | Freq: Three times a day (TID) | ORAL | Status: DC | PRN

## 2014-09-29 NOTE — Telephone Encounter (Signed)
Pt called and needs a refill on her Senokot and Oxycodone. Please call patient when the Oxycodone is up front, jw

## 2014-09-29 NOTE — Telephone Encounter (Signed)
Sennakot e-prescribed. Oxycodone prescription printed and left up front. Please let patient know.

## 2014-10-06 ENCOUNTER — Other Ambulatory Visit: Payer: Self-pay | Admitting: *Deleted

## 2014-10-06 DIAGNOSIS — I10 Essential (primary) hypertension: Secondary | ICD-10-CM

## 2014-10-06 MED ORDER — HYDROCHLOROTHIAZIDE 25 MG PO TABS: 25.0000 mg | ORAL_TABLET | Freq: Every day | ORAL | Status: DC

## 2014-10-14 ENCOUNTER — Telehealth: Payer: Self-pay | Admitting: Family Medicine

## 2014-10-14 NOTE — Telephone Encounter (Signed)
Patient dropped [place] Form to be completed and signed by PCP. Please, follow up with Patient.

## 2014-10-14 NOTE — Telephone Encounter (Signed)
Paperwork placed in MD box for review and completion. Yeudiel Mateo, CMA.

## 2014-10-15 NOTE — Telephone Encounter (Signed)
Pt informed that form is ready for pick up.  Forms copied for scanning in pt's record.  Derl Barrow, RN

## 2014-10-15 NOTE — Telephone Encounter (Signed)
Short term disability paperwork filled out and left with Tamika. -Dr. Lamar Benes

## 2014-11-03 ENCOUNTER — Telehealth: Payer: Self-pay | Admitting: Family Medicine

## 2014-11-03 ENCOUNTER — Telehealth: Payer: Self-pay | Admitting: *Deleted

## 2014-11-03 DIAGNOSIS — G894 Chronic pain syndrome: Secondary | ICD-10-CM

## 2014-11-03 MED ORDER — OXYCODONE-ACETAMINOPHEN 5-325 MG PO TABS: 1.0000 | ORAL_TABLET | Freq: Three times a day (TID) | ORAL | Status: DC | PRN

## 2014-11-03 MED ORDER — TRAZODONE HCL 100 MG PO TABS: 100.0000 mg | ORAL_TABLET | Freq: Every day | ORAL | Status: DC

## 2014-11-03 NOTE — Telephone Encounter (Signed)
Pt called and would like a refill on her Percocet.left up front for pick up. Please call when ready. jw

## 2014-11-03 NOTE — Telephone Encounter (Signed)
Received a refill request for Trazodone 100 mg #30; 1 tab PO qhs.  Medication is not listed on medication list.  Derl Barrow, RN

## 2014-11-03 NOTE — Telephone Encounter (Signed)
Rx printed and will be left up front for pickup. Please call pt and let her know. -Dr. Lamar Benes.

## 2014-11-03 NOTE — Telephone Encounter (Signed)
Pt advised as directed below and verbalized understanding. Kitt Minardi, CMA. 

## 2014-11-03 NOTE — Telephone Encounter (Signed)
Refill request. Will forward to PCP for review. Taylin Mans, CMA. 

## 2014-11-17 ENCOUNTER — Encounter: Payer: Self-pay | Admitting: Family Medicine

## 2014-11-17 NOTE — Progress Notes (Signed)
Patient dropped off foms to be filled out for her disability.  Please call her when completed.

## 2014-11-17 NOTE — Progress Notes (Signed)
Paperwork placed in PCP box for completion. Deloris Moger, CMA.

## 2014-11-19 NOTE — Progress Notes (Signed)
Paperwork filled out and left with Tamika. -Dr. Lamar Benes

## 2014-11-20 NOTE — Progress Notes (Signed)
Pt informed that FMLA paperwork is complete. Derl Barrow, RN

## 2014-12-10 ENCOUNTER — Ambulatory Visit (INDEPENDENT_AMBULATORY_CARE_PROVIDER_SITE_OTHER): Payer: BC Managed Care – PPO | Admitting: Family Medicine

## 2014-12-10 ENCOUNTER — Encounter: Payer: Self-pay | Admitting: Family Medicine

## 2014-12-10 VITALS — BP 100/66 | HR 81 | Temp 98.2°F | Ht 64.0 in | Wt 127.8 lb

## 2014-12-10 DIAGNOSIS — E119 Type 2 diabetes mellitus without complications: Secondary | ICD-10-CM | POA: Diagnosis not present

## 2014-12-10 DIAGNOSIS — G894 Chronic pain syndrome: Secondary | ICD-10-CM | POA: Diagnosis not present

## 2014-12-10 LAB — POCT GLYCOSYLATED HEMOGLOBIN (HGB A1C): HEMOGLOBIN A1C: 6.9

## 2014-12-10 MED ORDER — TRAZODONE HCL 100 MG PO TABS: 100.0000 mg | ORAL_TABLET | Freq: Every day | ORAL | Status: DC

## 2014-12-10 MED ORDER — OXYCODONE-ACETAMINOPHEN 5-325 MG PO TABS: 1.0000 | ORAL_TABLET | Freq: Three times a day (TID) | ORAL | Status: DC | PRN

## 2014-12-10 NOTE — Patient Instructions (Signed)
You are due for your mammogram and pap smear. Call the numbers on the information provided for your mammogram. Schedule an appt for your pap smear.
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
## 2014-12-11 NOTE — Assessment & Plan Note (Signed)
Well controlled, A1c 6.9 today. Foot exam done today, pt reports eye exam done in Jan/Feb 2016. Continue glipizide and metformin. F/u 3 months

## 2014-12-11 NOTE — Assessment & Plan Note (Signed)
Slightly worsened since last visit due to Pain increased recently due to recent hardware removal from left hip (reports still having several screws in as the heads stripped and they were unable to remove). Pt reports her short term disability forms are not required until sometime around October (found this out recently), discussed with her about long term disability evaluation or having orthopedics office. F/u as needed.

## 2015-01-08 ENCOUNTER — Other Ambulatory Visit: Payer: Self-pay | Admitting: Family Medicine

## 2015-01-08 MED ORDER — TRAZODONE HCL 100 MG PO TABS: 100.0000 mg | ORAL_TABLET | Freq: Every day | ORAL | Status: DC

## 2015-01-08 NOTE — Telephone Encounter (Signed)
Patient requesting refill of Trazodone. Please call patient for pick up.

## 2015-01-09 ENCOUNTER — Other Ambulatory Visit: Payer: Self-pay | Admitting: Family Medicine

## 2015-01-09 MED ORDER — TRAZODONE HCL 100 MG PO TABS: 100.0000 mg | ORAL_TABLET | Freq: Every day | ORAL | Status: DC

## 2015-01-09 NOTE — Telephone Encounter (Signed)
Requesting refill on trazadone

## 2015-01-13 ENCOUNTER — Other Ambulatory Visit: Payer: Self-pay | Admitting: Family Medicine

## 2015-01-13 DIAGNOSIS — G894 Chronic pain syndrome: Secondary | ICD-10-CM

## 2015-01-13 NOTE — Telephone Encounter (Signed)
Would like a refill on oxycodone. Thank you, Fonda Kinder, ASA

## 2015-01-15 MED ORDER — OXYCODONE-ACETAMINOPHEN 5-325 MG PO TABS: 1.0000 | ORAL_TABLET | Freq: Three times a day (TID) | ORAL | Status: DC | PRN

## 2015-01-15 NOTE — Telephone Encounter (Signed)
Pt called and was checking the status of her refill request for Oxycodone. jw

## 2015-01-15 NOTE — Telephone Encounter (Signed)
Patient is aware of this. Robin [name],CMA  

## 2015-01-18 ENCOUNTER — Other Ambulatory Visit: Payer: Self-pay | Admitting: Family Medicine

## 2015-02-16 ENCOUNTER — Other Ambulatory Visit: Payer: Self-pay | Admitting: Family Medicine

## 2015-02-16 DIAGNOSIS — G894 Chronic pain syndrome: Secondary | ICD-10-CM

## 2015-02-16 NOTE — Telephone Encounter (Signed)
Need refill for her oxycodone 5-235 mg

## 2015-02-17 MED ORDER — OXYCODONE-ACETAMINOPHEN 5-325 MG PO TABS: 1.0000 | ORAL_TABLET | Freq: Three times a day (TID) | ORAL | Status: DC | PRN

## 2015-02-17 NOTE — Telephone Encounter (Signed)
LMOVM stating Rx ready for pu. Please inform pt if she calls back. Jaylin Roundy, CMA.

## 2015-03-05 ENCOUNTER — Other Ambulatory Visit: Payer: Self-pay | Admitting: Family Medicine

## 2015-03-05 NOTE — Telephone Encounter (Signed)
Need 86m supply for lisinopril.  Cheaper to purchase for 70m quantity.

## 2015-03-06 MED ORDER — LISINOPRIL 40 MG PO TABS: 40.0000 mg | ORAL_TABLET | Freq: Every day | ORAL | Status: DC

## 2015-03-17 ENCOUNTER — Other Ambulatory Visit: Payer: Self-pay | Admitting: Family Medicine

## 2015-03-17 NOTE — Telephone Encounter (Signed)
Needs refill on glipizide Would like 3 month supply [facility] in [place]

## 2015-03-24 ENCOUNTER — Other Ambulatory Visit: Payer: Self-pay | Admitting: Family Medicine

## 2015-03-24 DIAGNOSIS — G894 Chronic pain syndrome: Secondary | ICD-10-CM

## 2015-03-24 NOTE — Telephone Encounter (Signed)
Need refill for her oxycodone

## 2015-03-26 NOTE — Telephone Encounter (Signed)
Pt calling to check on the status of this request. Fonda Kinder, ASA

## 2015-03-27 ENCOUNTER — Telehealth: Payer: Self-pay | Admitting: Family Medicine

## 2015-03-27 ENCOUNTER — Other Ambulatory Visit: Payer: Self-pay | Admitting: Family Medicine

## 2015-03-27 DIAGNOSIS — G894 Chronic pain syndrome: Secondary | ICD-10-CM

## 2015-03-27 MED ORDER — OXYCODONE-ACETAMINOPHEN 5-325 MG PO TABS: 1.0000 | ORAL_TABLET | Freq: Three times a day (TID) | ORAL | Status: DC | PRN

## 2015-03-27 NOTE — Telephone Encounter (Signed)
Spoke with patient and she plans to make an appt to follow up when she picks up her script. Gilliam Hawkes,CMA

## 2015-03-27 NOTE — Telephone Encounter (Signed)
Please call and let her know her Oxy prescription in waiting at the front desk. She also need to make an appointment with Dr Lamar Benes when she comes to get that prescription as she needs to be seen by him prior to her next pain medication refill

## 2015-03-27 NOTE — Telephone Encounter (Signed)
Patient picked up rx.  No available appt on display for October.  Please advise when and who will see for f/u refills.

## 2015-03-28 NOTE — Telephone Encounter (Signed)
Okay to schedule for end of September (double book is fine with me), or if unable to make that then I will give another refill before a visit in early November (I have multiple slots open in the first week). Can you please call the patient and let her know these 2 options.Thank you. -Dr. Lamar Benes

## 2015-03-30 NOTE — Telephone Encounter (Signed)
LM for patient to call back.  Please schedule her in next available for Dr. Lamar Benes and he will give her another refill next month to make it to that appt. J Thanks [employer]

## 2015-04-15 ENCOUNTER — Encounter: Payer: Self-pay | Admitting: Family Medicine

## 2015-04-15 ENCOUNTER — Ambulatory Visit (INDEPENDENT_AMBULATORY_CARE_PROVIDER_SITE_OTHER): Payer: BC Managed Care – PPO | Admitting: Family Medicine

## 2015-04-15 VITALS — BP 138/78 | HR 78 | Temp 98.2°F | Ht 64.0 in | Wt 128.0 lb

## 2015-04-15 DIAGNOSIS — G894 Chronic pain syndrome: Secondary | ICD-10-CM | POA: Diagnosis not present

## 2015-04-15 DIAGNOSIS — Z23 Encounter for immunization: Secondary | ICD-10-CM | POA: Diagnosis not present

## 2015-04-15 DIAGNOSIS — I10 Essential (primary) hypertension: Secondary | ICD-10-CM

## 2015-04-15 MED ORDER — OXYCODONE-ACETAMINOPHEN 5-325 MG PO TABS: 1.0000 | ORAL_TABLET | Freq: Three times a day (TID) | ORAL | Status: DC | PRN

## 2015-04-15 NOTE — Patient Instructions (Signed)
Schedule your pap smear!  Mammogram information on the sheet.  We will refer to the pain management specialists.

## 2015-04-16 NOTE — Assessment & Plan Note (Signed)
Initial BP elevated, repeat with manual cuff is at goal. Continue lisinopril 40, metoprolol 50 twice daily. Follow up 3 months.
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
## 2015-04-16 NOTE — Assessment & Plan Note (Signed)
Received disability paperwork from orthopedic surgeon; no current plans on surgical intervention. Given that the cause of her pain will not have any definitive attempt at intervention and we have not controlled her pain adequately feel she would be best managed long term by a pain management clinic, referral sent at visit.

## 2015-06-01 ENCOUNTER — Other Ambulatory Visit: Payer: Self-pay | Admitting: Family Medicine

## 2015-06-01 DIAGNOSIS — G894 Chronic pain syndrome: Secondary | ICD-10-CM

## 2015-06-01 MED ORDER — OXYCODONE-ACETAMINOPHEN 5-325 MG PO TABS: 1.0000 | ORAL_TABLET | Freq: Three times a day (TID) | ORAL | Status: DC | PRN

## 2015-06-01 NOTE — Telephone Encounter (Signed)
Called and left VM stating prescription is ready for pickup at front desk. -Dr. Lamar Benes

## 2015-06-01 NOTE — Telephone Encounter (Signed)
Needs refill on oxycodone-has one left

## 2015-08-06 ENCOUNTER — Other Ambulatory Visit: Payer: Self-pay | Admitting: Family Medicine

## 2015-08-06 DIAGNOSIS — G894 Chronic pain syndrome: Secondary | ICD-10-CM

## 2015-08-06 NOTE — Telephone Encounter (Signed)
Pt needs refill on oxycodone Robin [name], ASA ° °

## 2015-08-07 NOTE — Telephone Encounter (Signed)
Pt called and would ike to check the status of her request for pain medication since she is out. Robin [name]

## 2015-08-10 MED ORDER — OXYCODONE-ACETAMINOPHEN 5-325 MG PO TABS: 1.0000 | ORAL_TABLET | Freq: Three times a day (TID) | ORAL | Status: DC | PRN

## 2015-08-10 NOTE — Telephone Encounter (Signed)
Called and let patient know prescription is available for pickup.

## 2015-08-11 ENCOUNTER — Ambulatory Visit: Payer: Self-pay | Admitting: Family Medicine

## 2015-08-19 ENCOUNTER — Ambulatory Visit (INDEPENDENT_AMBULATORY_CARE_PROVIDER_SITE_OTHER): Payer: BC Managed Care – PPO | Admitting: Family Medicine

## 2015-08-19 ENCOUNTER — Other Ambulatory Visit (HOSPITAL_COMMUNITY)
Admission: RE | Admit: 2015-08-19 | Discharge: 2015-08-19 | Disposition: A | Discharge status: Home / Self Care | Payer: BC Managed Care – PPO | Source: Ambulatory Visit | Attending: Family Medicine | Admitting: Family Medicine

## 2015-08-19 VITALS — BP 100/80 | HR 80 | Temp 98.6°F | Wt 126.8 lb

## 2015-08-19 DIAGNOSIS — E119 Type 2 diabetes mellitus without complications: Secondary | ICD-10-CM | POA: Diagnosis not present

## 2015-08-19 DIAGNOSIS — Z0000 Encounter for general adult medical examination without abnormal findings: Secondary | ICD-10-CM

## 2015-08-19 DIAGNOSIS — L989 Disorder of the skin and subcutaneous tissue, unspecified: Secondary | ICD-10-CM

## 2015-08-19 DIAGNOSIS — Z01419 Encounter for gynecological examination (general) (routine) without abnormal findings: Secondary | ICD-10-CM | POA: Diagnosis not present

## 2015-08-19 DIAGNOSIS — M79671 Pain in right foot: Secondary | ICD-10-CM | POA: Diagnosis not present

## 2015-08-19 DIAGNOSIS — E785 Hyperlipidemia, unspecified: Secondary | ICD-10-CM | POA: Diagnosis not present

## 2015-08-19 DIAGNOSIS — Z124 Encounter for screening for malignant neoplasm of cervix: Secondary | ICD-10-CM | POA: Diagnosis not present

## 2015-08-19 DIAGNOSIS — Z1151 Encounter for screening for human papillomavirus (HPV): Secondary | ICD-10-CM | POA: Diagnosis not present

## 2015-08-19 DIAGNOSIS — Z1159 Encounter for screening for other viral diseases: Secondary | ICD-10-CM | POA: Diagnosis not present

## 2015-08-19 DIAGNOSIS — N952 Postmenopausal atrophic vaginitis: Secondary | ICD-10-CM

## 2015-08-19 DIAGNOSIS — I10 Essential (primary) hypertension: Secondary | ICD-10-CM

## 2015-08-19 LAB — POCT GLYCOSYLATED HEMOGLOBIN (HGB A1C): Hemoglobin A1C: 7.2

## 2015-08-20 DIAGNOSIS — L989 Disorder of the skin and subcutaneous tissue, unspecified: Secondary | ICD-10-CM | POA: Insufficient documentation

## 2015-08-20 DIAGNOSIS — Z0000 Encounter for general adult medical examination without abnormal findings: Secondary | ICD-10-CM | POA: Insufficient documentation

## 2015-08-20 LAB — LIPID PANEL
CHOL/HDL RATIO: 4.7 ratio (ref <=5.0)
CHOLESTEROL: 225 mg/dL — AB (ref 125–200)
HDL: 48 mg/dL (ref >=46)
LDL Cholesterol: 164 mg/dL — ABNORMAL HIGH (ref <130)
Triglycerides: 65 mg/dL (ref <150)
VLDL: 13 mg/dL (ref <30)

## 2015-08-20 LAB — BASIC METABOLIC PANEL WITH GFR
BUN: 23 mg/dL (ref 7–25)
CALCIUM: 10 mg/dL (ref 8.6–10.4)
CHLORIDE: 105 mmol/L (ref 98–110)
CO2: 26 mmol/L (ref 20–31)
CREATININE: 0.97 mg/dL (ref 0.50–1.05)
GFR, Est African American: 74 mL/min (ref >=60)
GFR, Est Non African American: 64 mL/min (ref >=60)
GLUCOSE: 87 mg/dL (ref 65–99)
Potassium: 4.1 mmol/L (ref 3.5–5.3)
Sodium: 139 mmol/L (ref 135–146)

## 2015-08-20 LAB — HEPATITIS C ANTIBODY: HCV Ab: NEGATIVE

## 2015-08-20 MED ORDER — ESTROGENS, CONJUGATED 0.625 MG/GM VA CREA: 0.5000 g | TOPICAL_CREAM | Freq: Every day | VAGINAL | Status: DC

## 2015-08-20 MED ORDER — ATORVASTATIN CALCIUM 40 MG PO TABS: 40.0000 mg | ORAL_TABLET | Freq: Every day | ORAL | Status: DC

## 2015-08-20 NOTE — Progress Notes (Signed)
Subjective:    Patient ID: Robin [name], female    DOB: 05/09/1956, 60 y.o.   MRN: [id number]  HPI  CC: pap smear  # Healthcare maintenance  Due for pap smear, last done 2010.  Due for hep C, colonoscopy, mammogram  # Atrophic vaginitis:  Seen in the past for this, was using premarin cream but stopped after reading about risks for vaginal cancer  Has significant issues with itchiness, dryness that prevents intercourse due to pain  Has not tried anything else ROS: no vaginal discharge  # Right foot lesion/pain  Brings up issue that has been present for past ~2 years.  Outside of right foot, closer to ankle than end of toes, says she has had a spot that is painful and scratchy ever since she had surgery (where she says hardware was removed). Area was not present before the surgery, and is located over the scar  It is very bothersome to her because it rubs on her shoes  Never noticed any drainage  Says it has not changed in size  Doesn't take anything for it or put anything on it, has scratched at it often. ROS: no bleeding, no numbness  # Hyperlipidemia  States she is adherent to medications  Denies any issue with statin  Last lipid panel 6 years ago  # Hypertension  At goal, Compliant with medications  Reports no issues with side effects  # Diabetes  Compliant with medications  Reports no issues with side effects  Feels her A1c will be higher because of the holidays, some diet indiscretion with sweets/treats  No dysuria, polyuria  Social Hx: former smoker  Review of Systems   See HPI for ROS.   Past medical history, surgical, family, and social history reviewed and updated in the EMR as appropriate. Objective:  BP 100/80 mmHg  Pulse 80  Temp(Src) 98.6 F (37 C) (Oral)  Wt 126 lb 12.8 oz (57.516 kg)  LMP 08/11/2011 Vitals and nursing note reviewed  General: no apparent distress GU: exam done with nurse present in room.  atrophic/thin external genitalia and vaginal mucosa. Cervix is normal in appearance, no significant drainage in vaginal vault Skin: right lateral foot near base of proximal 5th metatarsal is a ~1cm hyperkeratotic, hyperpigmented lesion overlying the end of a surgical scar. Does not appear consistent with keloid. It is tender to touch, no fluctuance, no erythema, no drainage.  Assessment & Plan:  Healthcare maintenance Pap smear today. Also checking Hep C, lipid panel.  Vaginal atrophy Discussed restarting premarin, given reassurance on risks for cancer (present but quite low and she is having significant symptoms). Refill sent in. Follow up as needed.  HYPERLIPIDEMIA States compliance with simvastatin. Lipid panel re-checked and LDL 164. With diagnosis of diabetes, will increase to high intensity statin, atorva 40mg , which I called and discussed with her. Follow up 3 months.  Skin lesion of right lower extremity In area consistent with keloid but does not appear clinically consistent with this. It has regular borders, hyperkeratotic but doesn't appear to be a wart (though this is a possibility). Since it is reportedly in an area of hardware, we will check an x-ray to see if there is something deep to the lesion that has caused chronic issue. If this is negative, can consider shave biopsy. Follow up with x-ray results and likely follow up in next month for removal.  Essential hypertension, benign At goal on hctz 25, lisinopril 40mg . Check BMP. Follow up 3 months  Diabetes mellitus  type II, controlled A1c slightly worsened but still well controlled, 7.2. Continue glipizide 10 twice daily, metformin 1000 twice daily. Follow up 3 months.

## 2015-08-20 NOTE — Assessment & Plan Note (Signed)
Discussed restarting premarin, given reassurance on risks for cancer (present but quite low and she is having significant symptoms). Refill sent in. Follow up as needed.

## 2015-08-20 NOTE — Assessment & Plan Note (Signed)
States compliance with simvastatin. Lipid panel re-checked and LDL 164. With diagnosis of diabetes, will increase to high intensity statin, atorva 40mg , which I called and discussed with her. Follow up 3 months.

## 2015-08-20 NOTE — Assessment & Plan Note (Signed)
At goal on hctz 25, lisinopril 40mg . Check BMP. Follow up 3 months

## 2015-08-20 NOTE — Assessment & Plan Note (Signed)
A1c slightly worsened but still well controlled, 7.2. Continue glipizide 10 twice daily, metformin 1000 twice daily. Follow up 3 months.

## 2015-08-20 NOTE — Assessment & Plan Note (Signed)
In area consistent with keloid but does not appear clinically consistent with this. It has regular borders, hyperkeratotic but doesn't appear to be a wart (though this is a possibility). Since it is reportedly in an area of hardware, we will check an x-ray to see if there is something deep to the lesion that has caused chronic issue. If this is negative, can consider shave biopsy. Follow up with x-ray results and likely follow up in next month for removal.

## 2015-08-20 NOTE — Assessment & Plan Note (Signed)
Pap smear today. Also checking Hep C, lipid panel.

## 2015-08-21 ENCOUNTER — Encounter: Payer: Self-pay | Admitting: Family Medicine

## 2015-08-21 LAB — CYTOLOGY - PAP

## 2015-08-24 ENCOUNTER — Other Ambulatory Visit: Payer: Self-pay

## 2015-08-24 DIAGNOSIS — Z1231 Encounter for screening mammogram for malignant neoplasm of breast: Secondary | ICD-10-CM

## 2015-08-28 ENCOUNTER — Ambulatory Visit
Admission: RE | Admit: 2015-08-28 | Discharge: 2015-08-28 | Disposition: A | Discharge status: Home / Self Care | Payer: BC Managed Care – PPO | Source: Ambulatory Visit | Attending: Family Medicine | Admitting: Family Medicine

## 2015-08-28 DIAGNOSIS — M79671 Pain in right foot: Secondary | ICD-10-CM

## 2015-09-02 ENCOUNTER — Ambulatory Visit
Admission: RE | Admit: 2015-09-02 | Discharge: 2015-09-02 | Disposition: A | Discharge status: Home / Self Care | Payer: BC Managed Care – PPO | Source: Ambulatory Visit

## 2015-09-02 DIAGNOSIS — Z1231 Encounter for screening mammogram for malignant neoplasm of breast: Secondary | ICD-10-CM

## 2015-09-18 ENCOUNTER — Other Ambulatory Visit: Payer: Self-pay | Admitting: Family Medicine

## 2015-09-18 DIAGNOSIS — G894 Chronic pain syndrome: Secondary | ICD-10-CM

## 2015-09-18 NOTE — Telephone Encounter (Signed)
Pt is calling for a refill on her Oxycodone left up front and also would like to speak to the doctor about her test results. Please call patient when ready to pick up. jw

## 2015-09-21 MED ORDER — OXYCODONE-ACETAMINOPHEN 5-325 MG PO TABS: 1.0000 | ORAL_TABLET | Freq: Three times a day (TID) | ORAL | Status: DC | PRN

## 2015-09-21 NOTE — Telephone Encounter (Signed)
LM for patient ok per DPR that script is ready for pick up.  Jazmin Hartsell,CMA  

## 2015-09-24 ENCOUNTER — Other Ambulatory Visit: Payer: Self-pay | Admitting: *Deleted

## 2015-09-24 DIAGNOSIS — I10 Essential (primary) hypertension: Secondary | ICD-10-CM

## 2015-09-24 MED ORDER — HYDROCHLOROTHIAZIDE 25 MG PO TABS: 25.0000 mg | ORAL_TABLET | Freq: Every day | ORAL | Status: DC

## 2015-10-02 ENCOUNTER — Telehealth: Payer: Self-pay | Admitting: Family Medicine

## 2015-10-02 NOTE — Telephone Encounter (Signed)
Pt is calling for her x-ray results on her foot. jw

## 2015-10-02 NOTE — Telephone Encounter (Signed)
Will forward to MD. Garald Rhew,CMA  

## 2015-10-06 NOTE — Telephone Encounter (Signed)
Called and left VM. X-ray results show evidence of degeneration, but the area that we were concerned about and got the x-ray for does not show any bone or metal hardware on the outside of the foot where she has a skin lesion.   As we had discussed in her last office visit, she can schedule an appointment so that we can remove that skin lesion that has been causing her issues.

## 2015-10-12 NOTE — Telephone Encounter (Signed)
Called patient again and was able to talk with patient, discussed results and that she can schedule a follow up appointment to see if we want to remove this (or possible inject steroid if it is consistent with keloid).

## 2015-10-14 ENCOUNTER — Other Ambulatory Visit: Payer: Self-pay | Admitting: Family Medicine

## 2015-10-14 ENCOUNTER — Encounter: Payer: Self-pay | Admitting: Family Medicine

## 2015-10-14 ENCOUNTER — Ambulatory Visit (INDEPENDENT_AMBULATORY_CARE_PROVIDER_SITE_OTHER): Payer: BC Managed Care – PPO | Admitting: Family Medicine

## 2015-10-14 VITALS — BP 134/67 | HR 77 | Temp 98.3°F | Wt 131.8 lb

## 2015-10-14 DIAGNOSIS — L989 Disorder of the skin and subcutaneous tissue, unspecified: Secondary | ICD-10-CM | POA: Diagnosis not present

## 2015-10-14 NOTE — Assessment & Plan Note (Signed)
Most consistent with keloid. X-rays reviewed and no apparent hardware at the surface of the skin. Intralesional steroid injection done today, follow up 6 weeks to evaluate response and repeat injection if needed.

## 2015-10-14 NOTE — Progress Notes (Addendum)
   Subjective:    Patient ID: Robin [name], female    DOB: 31-Jul-1955, 60 y.o.   MRN: [id number]  HPI  CC: foot lesion  # Right foot lesion:  Returns for follow up of lateral right foot skin lesion  Wants to get rid of it because it is very painful and annoying (painful when she has shoes on because it rubs)  It is located from a surgical scar where a screw was previously removed  No erythema or swelling  She scratches at it to remove some of the skin ROS: no fevers, no chills  Social Hx: current smoker  Review of Systems   See HPI for ROS.   Past medical history, surgical, family, and social history reviewed and updated in the EMR as appropriate. Objective:  BP 134/67 mmHg  Pulse 77  Temp(Src) 98.3 F (36.8 C) (Oral)  Wt 131 lb 12.8 oz (59.784 kg)  LMP 08/11/2011 Vitals and nursing note reviewed  General: no apparent distress  Skin: right lateral foot hyperkeratotic appearing scar, seems consistent with keloid, see picture  Addendum: added clinical pictures       Subdermal steroid injection: Discussed risk/benefits including infection, hypopigmentation, need for multiple injections. Patient gives verbal and written consent, signed copy to be scanned in the chart. Area was prepped with iodine swab and alcohol swab. 1cc of 40mg /ml methylprednisolone and 1cc of 1% lidocaine was injected intralesionally. Patient tolerated procedure well. Covered with bandaid.  Assessment & Plan:  Skin lesion of right lower extremity Most consistent with keloid. X-rays reviewed and no apparent hardware at the surface of the skin. Intralesional steroid injection done today, follow up 6 weeks to evaluate response and repeat injection if needed.

## 2015-10-26 ENCOUNTER — Telehealth: Payer: Self-pay | Admitting: Family Medicine

## 2015-10-26 DIAGNOSIS — G894 Chronic pain syndrome: Secondary | ICD-10-CM

## 2015-10-26 NOTE — Telephone Encounter (Signed)
Pt called and would like a refill on her Oxycodone left up front for pick up. jw °

## 2015-10-27 NOTE — Telephone Encounter (Signed)
Robin [name] still asking for refill on med.  Took last one on Friday.

## 2015-10-28 MED ORDER — OXYCODONE-ACETAMINOPHEN 5-325 MG PO TABS: 1.0000 | ORAL_TABLET | Freq: Three times a day (TID) | ORAL | Status: DC | PRN

## 2015-10-28 NOTE — Telephone Encounter (Signed)
Refills given x 2 months.

## 2015-10-28 NOTE — Telephone Encounter (Signed)
Patient is aware that scripts are ready for pick up.  Lessie Funderburke,CMA  

## 2015-11-27 ENCOUNTER — Ambulatory Visit: Payer: Self-pay | Admitting: Family Medicine

## 2015-12-04 ENCOUNTER — Ambulatory Visit (INDEPENDENT_AMBULATORY_CARE_PROVIDER_SITE_OTHER): Payer: BC Managed Care – PPO | Admitting: Family Medicine

## 2015-12-04 ENCOUNTER — Encounter: Payer: Self-pay | Admitting: Family Medicine

## 2015-12-04 VITALS — BP 120/69 | HR 89 | Temp 98.2°F | Wt 129.0 lb

## 2015-12-04 DIAGNOSIS — L989 Disorder of the skin and subcutaneous tissue, unspecified: Secondary | ICD-10-CM

## 2015-12-04 DIAGNOSIS — G894 Chronic pain syndrome: Secondary | ICD-10-CM

## 2015-12-04 DIAGNOSIS — E119 Type 2 diabetes mellitus without complications: Secondary | ICD-10-CM | POA: Diagnosis not present

## 2015-12-04 LAB — POCT GLYCOSYLATED HEMOGLOBIN (HGB A1C): Hemoglobin A1C: 7.8

## 2015-12-04 MED ORDER — METHYLPREDNISOLONE ACETATE 80 MG/ML IJ SUSP
40.0000 mg | Freq: Once | INTRAMUSCULAR | Status: AC
  Administered 2015-12-04: 40 mg via INTRALESIONAL

## 2015-12-04 MED ORDER — OXYCODONE-ACETAMINOPHEN 5-325 MG PO TABS: 1.0000 | ORAL_TABLET | Freq: Three times a day (TID) | ORAL | Status: DC | PRN

## 2015-12-04 NOTE — Patient Instructions (Signed)
Schedule an appointment for July to meet your new doctor.  You can do 1-2 more steroid injections of the scar. If still not improving or you want to try to cut it off let your next doctor know.

## 2015-12-04 NOTE — Assessment & Plan Note (Signed)
Repeat steroid injection today. More painful than last injection. Unable to get full dose in. Recommended 1-2 more injections. Also discussed again possible shave biopsy, however possible recurrence or worsening if this is a true keloid.

## 2015-12-04 NOTE — Assessment & Plan Note (Signed)
A1c slightly worsened but still <8. Continue current regimen. Follow up 3 months.

## 2015-12-04 NOTE — Assessment & Plan Note (Signed)
Refill given for June. Discussed that she needs to schedule a follow up with her next PCP in July.

## 2015-12-04 NOTE — Progress Notes (Signed)
   Subjective:    Patient ID: Robin [name], female    DOB: 02-06-1956, 60 y.o.   MRN: [id number]  HPI  CC: follow up   # Diabetes:  A1c went from 7.2 to 7.8, she expected this as she has been eating a lot more sugary/sweet foods  Taking metformin and glipizide.   CBG monitoring: not recommended/required, doesn't do ROS: no polyuria, polydipsia  # Skin lesion right foot  Thinks scar may have gotten a little smaller after steroid injection  Right now it is a little tender around the area (not necessarily right on the lesion)  Social Hx: former smoker  Review of Systems   See HPI for ROS.   Past medical history, surgical, family, and social history reviewed and updated in the EMR as appropriate. Objective:  BP 120/69 mmHg  Pulse 89  Temp(Src) 98.2 F (36.8 C) (Oral)  Wt 129 lb (58.514 kg)  SpO2 96%  LMP 08/11/2011 Vitals and nursing note reviewed  General: no apparent distress  Skin: right lateral foot with apparent hypertrophic scar/keloid, appears slightly smaller than last visit.  Subdermal steroid injection RIGHT LATERAL FOOT: Discussed risk/benefits including infection, hypopigmentation, need for multiple injections. Patient gives verbal and written consent, signed copy to be scanned in the chart. Area was prepped with iodine swab and alcohol swab. 0.5cc of 80mg /ml methylprednisolone and 0.5cc of 1% lidocaine was injected intralesionally, however only approx 0.6cc of this was able to inject. Patient tolerated procedure well. Covered with bandaid.  Assessment & Plan:  Skin lesion of right lower extremity Repeat steroid injection today. More painful than last injection. Unable to get full dose in. Recommended 1-2 more injections. Also discussed again possible shave biopsy, however possible recurrence or worsening if this is a true keloid.   Diabetes mellitus type II, controlled A1c slightly worsened but still <8. Continue current regimen. Follow up 3  months.  Chronic pain syndrome Refill given for June. Discussed that she needs to schedule a follow up with her next PCP in July.

## 2015-12-11 ENCOUNTER — Other Ambulatory Visit: Payer: Self-pay | Admitting: Family Medicine

## 2016-01-01 ENCOUNTER — Other Ambulatory Visit: Payer: Self-pay | Admitting: Family Medicine

## 2016-01-04 ENCOUNTER — Telehealth: Payer: Self-pay | Admitting: Family Medicine

## 2016-01-04 DIAGNOSIS — E1140 Type 2 diabetes mellitus with diabetic neuropathy, unspecified: Secondary | ICD-10-CM

## 2016-01-04 DIAGNOSIS — Z794 Long term (current) use of insulin: Principal | ICD-10-CM

## 2016-01-04 NOTE — Telephone Encounter (Signed)
Pt is calling because she has been waiting since the first of June for a refill on her Verio test strips. They have been sent by the pharmacy, but they have been sending them to Dr. Yong Channel instead and they have been denied. Can we call these in ASAP since she is desperate need and the pharmacy has been sending this to the wrong doctor. jw

## 2016-01-05 ENCOUNTER — Other Ambulatory Visit: Payer: Self-pay | Admitting: *Deleted

## 2016-01-05 DIAGNOSIS — E119 Type 2 diabetes mellitus without complications: Secondary | ICD-10-CM

## 2016-01-05 MED ORDER — GLUCOSE BLOOD VI STRP: ORAL_STRIP | Status: DC

## 2016-01-05 NOTE — Telephone Encounter (Signed)
Prescription sent. Can you call and let the patient know? Thanks.

## 2016-01-05 NOTE — Telephone Encounter (Signed)
LM for patient that script was sent. Lashanda Storlie,CMA

## 2016-02-03 ENCOUNTER — Ambulatory Visit (INDEPENDENT_AMBULATORY_CARE_PROVIDER_SITE_OTHER): Payer: BC Managed Care – PPO | Admitting: Internal Medicine

## 2016-02-03 DIAGNOSIS — G894 Chronic pain syndrome: Secondary | ICD-10-CM

## 2016-02-03 MED ORDER — OXYCODONE-ACETAMINOPHEN 5-325 MG PO TABS: 1.0000 | ORAL_TABLET | Freq: Three times a day (TID) | ORAL | 0 refills | Status: DC | PRN

## 2016-02-03 NOTE — Progress Notes (Signed)
Unable to see patient today as she could not wait to be seen. She requests that I refill her Percocet for chronic pain. Refilled Percocet. She has another appointment with me scheduled for next week. She will likely need an updated UDS and review of chronic pain contract.

## 2016-02-23 ENCOUNTER — Ambulatory Visit: Payer: Self-pay | Admitting: Internal Medicine

## 2016-03-04 ENCOUNTER — Encounter: Payer: Self-pay | Admitting: Internal Medicine

## 2016-03-04 ENCOUNTER — Ambulatory Visit (INDEPENDENT_AMBULATORY_CARE_PROVIDER_SITE_OTHER): Payer: BC Managed Care – PPO | Admitting: Internal Medicine

## 2016-03-04 VITALS — BP 136/77 | HR 80 | Temp 98.4°F | Ht 64.0 in | Wt 130.0 lb

## 2016-03-04 DIAGNOSIS — Z794 Long term (current) use of insulin: Secondary | ICD-10-CM

## 2016-03-04 DIAGNOSIS — I10 Essential (primary) hypertension: Secondary | ICD-10-CM | POA: Diagnosis not present

## 2016-03-04 DIAGNOSIS — L989 Disorder of the skin and subcutaneous tissue, unspecified: Secondary | ICD-10-CM

## 2016-03-04 DIAGNOSIS — E119 Type 2 diabetes mellitus without complications: Secondary | ICD-10-CM

## 2016-03-04 DIAGNOSIS — E1140 Type 2 diabetes mellitus with diabetic neuropathy, unspecified: Secondary | ICD-10-CM | POA: Diagnosis not present

## 2016-03-04 LAB — BASIC METABOLIC PANEL
BUN: 21 mg/dL (ref 7–25)
CHLORIDE: 106 mmol/L (ref 98–110)
CO2: 27 mmol/L (ref 20–31)
CREATININE: 0.97 mg/dL (ref 0.50–1.05)
Calcium: 9.7 mg/dL (ref 8.6–10.4)
Glucose, Bld: 151 mg/dL — ABNORMAL HIGH (ref 65–99)
POTASSIUM: 4.4 mmol/L (ref 3.5–5.3)
Sodium: 139 mmol/L (ref 135–146)

## 2016-03-04 LAB — POCT GLYCOSYLATED HEMOGLOBIN (HGB A1C): HEMOGLOBIN A1C: 7.3

## 2016-03-04 MED ORDER — CLOBETASOL PROPIONATE 0.05 % EX OINT: 1.0000 "application " | TOPICAL_OINTMENT | Freq: Two times a day (BID) | CUTANEOUS | 0 refills | Status: DC

## 2016-03-04 MED ORDER — ESTROGENS, CONJUGATED 0.625 MG/GM VA CREA: 0.5000 g | TOPICAL_CREAM | Freq: Every day | VAGINAL | 12 refills | Status: DC

## 2016-03-04 NOTE — Progress Notes (Signed)
   [facility] Phone: [phone]   Date of Visit: 03/04/2016   HPI: Patient is here for follow up of skin lesion of right lateral foot.   Skin Lesion:  - reports the lesion has shrunk with steroid injections. Steroid injections: 11/28/15 and 10/14/15 - reports still tender to palpation at that site (more around the area) - no erythema  DM:  - due for A1c today; A1c 7.3 from 8.8 in May  - taking Metformin 1000mg  BID and Glipizide 10mg  BID  - reports that she has been getting some lows to the 40-50s for the "past few months". She is unsure why this occurred over the past few months; per chart review, she has been on this Glipizide dose since 2015. She initially thinks this usually occurs in the early morning. Then reports this may happen at night time. I discussed that it is important to decrease the dose of Glipizide to avoid hypoglycemia. Patient is not willing to decrease the dose today. Reports that she wants to monitor her cbgs further prior to making any changes. I discussed the dangers of hypoglycemia, but patient still insists on waiting until next clinic visit in 2 weeks (note below).  - up to date on ophthalmology exam - on ace inhibitor for renal protection   ROS: See HPI.  [place]:  DM  PHYSICAL EXAM: BP 136/77   Pulse 80   Temp 98.4 F (36.9 C) (Oral)   Ht 5\' 4"  (1.626 m)   Wt 130 lb (59 kg)   LMP 08/11/2011   BMI 22.31 kg/m  GEN: NAD Skin: right lateral foot with hyperkeratotic lesion. Tenderness to palpation of the skin surrounding the lesion. No erythema.   Diabetic Foot Exam - Simple   Simple Foot Form Diabetic Foot exam was performed with the following findings:  Yes 03/04/2016 10:18 AM  Visual Inspection Sensation Testing Pulse Check Comments    ASSESSMENT/PLAN:  Health maintenance:  - colonoscopy: patient is hesitant about this. Will let me know in 2 weeks - declines HIV screening; reports she has had screening in the past ( no  record in our chart)   Skin lesion of right lower extremity Hyperkeratotic skin lesion. Will try Clobetasol BID x 2 weeks and cover with bandaid. Follow up in 2 weeks.   Diabetes mellitus type II, controlled A1c improved but patient reports of hypoglycemia for the past few months. She refuses to decrease dose of Glipizide right now and wants to monitor. I dicussed the dangers of hypoglycemia. Patient understands but still would like to wait to make any decreases in dose until follow up visit in 2 weeks for her foot.    [name], MD PGY [place]

## 2016-03-04 NOTE — Patient Instructions (Signed)
Please apply Clobetasol twice a day on your lesion on your foot. Then cover it with a band aid. Do this for two weeks. Make a follow up appointment in 2 weeks for this.   Please monitor your blood sugar. I discussed decreasing your Glipizide dose today because you mentioned of low sugar but you wanted to continue with the same regimen until your follow up visit.

## 2016-03-06 NOTE — Assessment & Plan Note (Signed)
Hyperkeratotic skin lesion. Will try Clobetasol BID x 2 weeks and cover with bandaid. Follow up in 2 weeks.

## 2016-03-07 ENCOUNTER — Other Ambulatory Visit: Payer: Self-pay | Admitting: *Deleted

## 2016-03-07 DIAGNOSIS — G894 Chronic pain syndrome: Secondary | ICD-10-CM

## 2016-03-07 NOTE — Telephone Encounter (Signed)
Patient states she forgot to tell MD that she needed a refill on her pain medication at her appointment on Friday. States she took her last pill yesterday.

## 2016-03-08 ENCOUNTER — Other Ambulatory Visit: Payer: Self-pay | Admitting: Internal Medicine

## 2016-03-08 DIAGNOSIS — G894 Chronic pain syndrome: Secondary | ICD-10-CM

## 2016-03-08 MED ORDER — OXYCODONE-ACETAMINOPHEN 5-325 MG PO TABS: 1.0000 | ORAL_TABLET | Freq: Three times a day (TID) | ORAL | 0 refills | Status: DC | PRN

## 2016-03-08 NOTE — Progress Notes (Signed)
This medication is not able to be called into pharmacy.  Will forward to MD to please print script for patient to pick up. Shiela Bruns,CMA

## 2016-03-08 NOTE — Progress Notes (Signed)
Rx printed and placed at front desk. Please call pt to let her know .

## 2016-03-08 NOTE — Progress Notes (Signed)
Could not phone in prescription for Percocet. Rx Printed and placed at front desk for patient to pick up.

## 2016-03-08 NOTE — Progress Notes (Signed)
Prescription for Percocet called in to pharmacy. Please call patient and let her know

## 2016-03-08 NOTE — Progress Notes (Signed)
Lm for patient that script was called into the pharmacy. Nemesio Castrillon,CMA

## 2016-03-08 NOTE — Progress Notes (Signed)
Patient is aware. Jazmin Hartsell,CMA  

## 2016-03-09 ENCOUNTER — Other Ambulatory Visit: Payer: Self-pay | Admitting: *Deleted

## 2016-03-09 MED ORDER — LISINOPRIL 40 MG PO TABS: 40.0000 mg | ORAL_TABLET | Freq: Every day | ORAL | 3 refills | Status: DC

## 2016-03-09 NOTE — Assessment & Plan Note (Signed)
A1c improved but patient reports of hypoglycemia for the past few months. She refuses to decrease dose of Glipizide right now and wants to monitor. I dicussed the dangers of hypoglycemia. Patient understands but still would like to wait to make any decreases in dose until follow up visit in 2 weeks for her foot.

## 2016-03-27 NOTE — Progress Notes (Deleted)
   [facility] Phone: [phone]   Date of Visit: 03/29/2016   HPI:  DM:  - A1c 7.3 (02/2016) < 8.8 (11/2015) - at last visit reported hypoglycemia but declined to decrease dose of Glipizide  - UTD on opthalmology exam  - on ACE inhibitor  Hyperkeratotic Skin Lesion:  - last visit in 02/2016, prescribed Clobetasol BID x 2 weeks   Health maintenance:  - colonoscopy: patient is hesitant about this. Will let me know in 2 weeks - declines HIV screening; reports she has had screening in the past ( no record in our chart)   Tyroid Nodule: Noted Incidentally in CT C-spine in 01/2013. Normal TSH and free T4 in 07/2013. Do not see further follow up.    ROS: See HPI.  [place]:  PMH: HTN, pTachycardia DM2, HLD Tobacco Use Allergic Rhinitis Tyroid Nodule: Noted Incidentally in CT C-spine in 01/2013. Normal TSH and free T4 in 07/2013. Do not see further follow up.  Chronic Pain  PHYSICAL EXAM: LMP 08/11/2011  Gen: *** HEENT: *** Heart: *** Lungs: *** Neuro: *** Ext: ***  ASSESSMENT/PLAN:  Health maintenance:  -***  No problem-specific Assessment & Plan notes found for this encounter.  FOLLOW UP: Follow up in *** for ***  [name], MD PGY [place]

## 2016-03-29 ENCOUNTER — Ambulatory Visit: Payer: BC Managed Care – PPO | Admitting: Internal Medicine

## 2016-04-08 ENCOUNTER — Telehealth: Payer: Self-pay | Admitting: Internal Medicine

## 2016-04-08 NOTE — Telephone Encounter (Signed)
Pt is out of her pain medication and needs a refill. Pt would like to be able to pick up a hard copy today. Pt also needs a refill on hydrochlorothiazide. Please advise. Thanks! ep
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
## 2016-04-12 ENCOUNTER — Encounter: Payer: Self-pay | Admitting: Internal Medicine

## 2016-04-12 ENCOUNTER — Ambulatory Visit (INDEPENDENT_AMBULATORY_CARE_PROVIDER_SITE_OTHER): Payer: BC Managed Care – PPO | Admitting: Internal Medicine

## 2016-04-12 VITALS — BP 142/61 | HR 89 | Temp 98.2°F | Ht 64.0 in | Wt 131.2 lb

## 2016-04-12 DIAGNOSIS — E041 Nontoxic single thyroid nodule: Secondary | ICD-10-CM

## 2016-04-12 DIAGNOSIS — E042 Nontoxic multinodular goiter: Secondary | ICD-10-CM

## 2016-04-12 DIAGNOSIS — G894 Chronic pain syndrome: Secondary | ICD-10-CM | POA: Diagnosis not present

## 2016-04-12 DIAGNOSIS — L989 Disorder of the skin and subcutaneous tissue, unspecified: Secondary | ICD-10-CM

## 2016-04-12 DIAGNOSIS — I479 Paroxysmal tachycardia, unspecified: Secondary | ICD-10-CM | POA: Diagnosis not present

## 2016-04-12 DIAGNOSIS — Z23 Encounter for immunization: Secondary | ICD-10-CM | POA: Diagnosis not present

## 2016-04-12 DIAGNOSIS — I10 Essential (primary) hypertension: Secondary | ICD-10-CM

## 2016-04-12 MED ORDER — OXYCODONE-ACETAMINOPHEN 5-325 MG PO TABS: 1.0000 | ORAL_TABLET | Freq: Three times a day (TID) | ORAL | 0 refills | Status: DC | PRN

## 2016-04-12 MED ORDER — HYDROCHLOROTHIAZIDE 25 MG PO TABS: 25.0000 mg | ORAL_TABLET | Freq: Every day | ORAL | 3 refills | Status: DC

## 2016-04-12 NOTE — Patient Instructions (Addendum)
I made a referral to the dermatologist for your foot. They will get in contact with you in about 1 to 1.5 weeks. Please let us know if you do not hear from them.   Please make a follow up appointment for your diabetes next month We ordered a thyroid ultrasound to evaluate the thyroid nodule that was noted on imaging in 2014.

## 2016-04-13 ENCOUNTER — Telehealth: Payer: Self-pay | Admitting: Internal Medicine

## 2016-04-13 NOTE — Telephone Encounter (Signed)
LMOVM for patient to return call. Dermatology appointment has been scheduled for Friday, 04/22/16 at 11:00 AM at Dr. Shepard [facility] office.   Address: [address], [place], [place] [address] Phone: [phone]  If this appt does not work with pt's schedule, she can call their office to reschedule.   Arvada Seaborn K

## 2016-04-16 LAB — PAIN MGMT, PROFILE 4 CONF W/O MM, U
AMPHETAMINES: NEGATIVE ng/mL (ref <500)
Barbiturates: NEGATIVE ng/mL (ref <300)
Benzodiazepines: NEGATIVE ng/mL (ref <100)
CREATININE: 178.8 mg/dL (ref >=20.0)
Cocaine Metabolite: NEGATIVE ng/mL (ref <150)
Codeine: NEGATIVE ng/mL (ref <50)
HYDROCODONE: NEGATIVE ng/mL (ref <50)
Hydromorphone: NEGATIVE ng/mL (ref <50)
METHADONE METABOLITE: NEGATIVE ng/mL (ref <100)
Morphine: NEGATIVE ng/mL (ref <50)
Norhydrocodone: NEGATIVE ng/mL (ref <50)
Noroxycodone: 3204 ng/mL — ABNORMAL HIGH (ref <50)
OXYCODONE: POSITIVE ng/mL — AB (ref <100)
OXYMORPHONE: 5432 ng/mL — AB (ref <50)
Opiates: NEGATIVE ng/mL (ref <100)
Oxidant: NEGATIVE ug/mL (ref <200)
Oxycodone: 2652 ng/mL — ABNORMAL HIGH (ref <50)
PHENCYCLIDINE: NEGATIVE ng/mL (ref <25)
pH: 5.37 (ref 4.5–9.0)

## 2016-04-16 NOTE — Assessment & Plan Note (Signed)
Has tried steroid injections in the past which has shrunk the lesion but made it sore. We tried high dose steroid cream for short term which patient reports has helped slightly but still has soreness. Will refer to dermatology for further evaluation. Patient is also agreeable to this.

## 2016-04-16 NOTE — Assessment & Plan Note (Signed)
Hypoglycemic episodes resolved per patient but reports that she still feels some symptoms when her cbg < 100. Is resistant to decrease Glipizide today and would like to monitor for another month. We discussed the dangers of hypoglycemia. Patient knows how to treat hypoglycemia.

## 2016-04-16 NOTE — Assessment & Plan Note (Signed)
No nodules palpated on exam today, but with nodule noted on imaging, will obtain Thyroid US. (TSH checked previously was normal)

## 2016-04-20 ENCOUNTER — Ambulatory Visit (HOSPITAL_COMMUNITY): Admission: RE | Admit: 2016-04-20 | Payer: BC Managed Care – PPO | Source: Ambulatory Visit

## 2016-04-25 ENCOUNTER — Ambulatory Visit (HOSPITAL_COMMUNITY): Payer: BC Managed Care – PPO | Attending: Family Medicine

## 2016-05-06 ENCOUNTER — Other Ambulatory Visit: Payer: Self-pay | Admitting: Internal Medicine

## 2016-05-06 MED ORDER — FLUCONAZOLE 150 MG PO TABS: 150.0000 mg | ORAL_TABLET | Freq: Once | ORAL | 0 refills | Status: AC

## 2016-05-06 NOTE — Telephone Encounter (Signed)
Pt was given antibiotics by Dr. Nevada Crane, the antibiotics have caused a yeast infection. Pt would like to have something called in to [facility] in [place]. Please advise. Thanks! ep

## 2016-05-06 NOTE — Telephone Encounter (Signed)
Pt informed of rx sent to her pharmacy, directions given. Pt voiced understanding.

## 2016-05-06 NOTE — Telephone Encounter (Signed)
Fluconazole sent to pharmacy. Patients only needs one dose to treat (most of the time). Please call and let patient know.

## 2016-05-16 ENCOUNTER — Other Ambulatory Visit: Payer: Self-pay | Admitting: Family Medicine

## 2016-05-17 ENCOUNTER — Telehealth: Payer: Self-pay | Admitting: Internal Medicine

## 2016-05-17 NOTE — Telephone Encounter (Signed)
Pt is calling for three refills. Metformin, Glipizide  Sent to the pharmacy and percocet left up front for pickup. Please call patient when ready. jw

## 2016-05-18 ENCOUNTER — Other Ambulatory Visit: Payer: Self-pay | Admitting: Family Medicine

## 2016-05-19 ENCOUNTER — Other Ambulatory Visit: Payer: Self-pay | Admitting: Internal Medicine

## 2016-05-19 DIAGNOSIS — G894 Chronic pain syndrome: Secondary | ICD-10-CM

## 2016-05-19 MED ORDER — GLIPIZIDE 10 MG PO TABS: ORAL_TABLET | ORAL | 3 refills | Status: DC

## 2016-05-19 MED ORDER — OXYCODONE-ACETAMINOPHEN 5-325 MG PO TABS: 1.0000 | ORAL_TABLET | Freq: Three times a day (TID) | ORAL | 0 refills | Status: DC | PRN

## 2016-05-19 NOTE — Progress Notes (Signed)
Metformin and Glipizide sent to pharmacy. Percocet Rx printed and placed up front for pick up. Please call patient to inform.

## 2016-05-19 NOTE — Progress Notes (Signed)
Pt informed of rx sent to pharmacy and percocet ready for pick up at our office.

## 2016-06-23 ENCOUNTER — Other Ambulatory Visit: Payer: Self-pay | Admitting: *Deleted

## 2016-06-23 DIAGNOSIS — G894 Chronic pain syndrome: Secondary | ICD-10-CM

## 2016-06-23 MED ORDER — OXYCODONE-ACETAMINOPHEN 5-325 MG PO TABS: 1.0000 | ORAL_TABLET | Freq: Three times a day (TID) | ORAL | 0 refills | Status: DC | PRN

## 2016-06-23 NOTE — Telephone Encounter (Signed)
Pt informed of rx ready for pick at our office.

## 2016-06-23 NOTE — Telephone Encounter (Signed)
Checked controlled substance database; appropriate. Printed Rx and place at front desk for pick up. Please let patient know. Additionally patient was supposed to follow in 1 month for DM (last seen in 04/2016)

## 2016-06-29 ENCOUNTER — Ambulatory Visit: Payer: BC Managed Care – PPO | Admitting: Internal Medicine
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
## 2016-07-01 ENCOUNTER — Ambulatory Visit: Payer: BC Managed Care – PPO | Admitting: Internal Medicine
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
## 2016-07-26 ENCOUNTER — Ambulatory Visit (INDEPENDENT_AMBULATORY_CARE_PROVIDER_SITE_OTHER): Payer: BC Managed Care – PPO | Admitting: Internal Medicine

## 2016-07-26 ENCOUNTER — Encounter: Payer: Self-pay | Admitting: Internal Medicine

## 2016-07-26 VITALS — BP 120/70 | HR 78 | Temp 97.7°F | Wt 131.0 lb

## 2016-07-26 DIAGNOSIS — E119 Type 2 diabetes mellitus without complications: Secondary | ICD-10-CM

## 2016-07-26 LAB — POCT GLYCOSYLATED HEMOGLOBIN (HGB A1C): HEMOGLOBIN A1C: 7.3

## 2016-07-26 MED ORDER — GLIPIZIDE 5 MG PO TABS: ORAL_TABLET | ORAL | 0 refills | Status: DC

## 2016-07-26 NOTE — Patient Instructions (Addendum)
Let's decrease your Glipizide to 5mg  twice a day.  Please make follow up appointment in 2 weeks Call me if your sugar is <70 multiple days in a row.

## 2016-07-26 NOTE — Assessment & Plan Note (Signed)
Due to hypoglycemia, patient now agreeable to decrease Glipizide from 10mg  BID to 5mg  BID. Discussed increasing Metformin from 1000mg  BID to 850mg  TID; patient picked up a new refill and would like to wait to make this change.  - follow up in 2 weeks - patient to call if she continues to have CBGs <70 consistently despite change in medication

## 2016-07-29 ENCOUNTER — Other Ambulatory Visit: Payer: Self-pay | Admitting: Internal Medicine

## 2016-07-29 DIAGNOSIS — G894 Chronic pain syndrome: Secondary | ICD-10-CM

## 2016-07-29 NOTE — Telephone Encounter (Signed)
Pt needs a refill on pain medication. ep

## 2016-08-01 MED ORDER — OXYCODONE-ACETAMINOPHEN 5-325 MG PO TABS: 1.0000 | ORAL_TABLET | Freq: Three times a day (TID) | ORAL | 0 refills | Status: DC | PRN

## 2016-08-01 NOTE — Telephone Encounter (Signed)
Rx printed and ready for pick up. Please call patient to inform.

## 2016-08-01 NOTE — Telephone Encounter (Signed)
Patient is aware that script is ready for pick up. Lalanya Rufener,CMA  
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
## 2016-08-11 ENCOUNTER — Ambulatory Visit: Payer: BC Managed Care – PPO | Admitting: Internal Medicine

## 2016-08-31 ENCOUNTER — Telehealth: Payer: Self-pay

## 2016-08-31 DIAGNOSIS — G894 Chronic pain syndrome: Secondary | ICD-10-CM

## 2016-08-31 NOTE — Telephone Encounter (Signed)
Pt would like refill on Percocet. Please call when ready to be picked up. Ottis Stain, CMA

## 2016-09-01 MED ORDER — OXYCODONE-ACETAMINOPHEN 5-325 MG PO TABS: 1.0000 | ORAL_TABLET | Freq: Three times a day (TID) | ORAL | 0 refills | Status: DC | PRN

## 2016-09-01 NOTE — Addendum Note (Signed)
Addended by: [name] on: 09/01/2016 10:46 AM   Modules accepted: Orders

## 2016-09-01 NOTE — Telephone Encounter (Signed)
LM for patient to call back.. Please inform that script is ready for pick up. Jazmin Hartsell,CMA

## 2016-09-01 NOTE — Telephone Encounter (Signed)
Rx printed and placed at front desk. Please inform patient.

## 2016-09-09 ENCOUNTER — Ambulatory Visit: Payer: BC Managed Care – PPO | Admitting: Internal Medicine
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
## 2016-09-16 ENCOUNTER — Ambulatory Visit (INDEPENDENT_AMBULATORY_CARE_PROVIDER_SITE_OTHER): Payer: BC Managed Care – PPO | Admitting: Internal Medicine

## 2016-09-16 VITALS — BP 110/82 | HR 84 | Temp 98.2°F | Wt 136.6 lb

## 2016-09-16 DIAGNOSIS — E118 Type 2 diabetes mellitus with unspecified complications: Secondary | ICD-10-CM | POA: Diagnosis not present

## 2016-09-16 DIAGNOSIS — G8929 Other chronic pain: Secondary | ICD-10-CM | POA: Diagnosis not present

## 2016-09-16 DIAGNOSIS — G894 Chronic pain syndrome: Secondary | ICD-10-CM | POA: Diagnosis not present

## 2016-09-16 DIAGNOSIS — I10 Essential (primary) hypertension: Secondary | ICD-10-CM | POA: Diagnosis not present

## 2016-09-16 DIAGNOSIS — E785 Hyperlipidemia, unspecified: Secondary | ICD-10-CM

## 2016-09-16 NOTE — Patient Instructions (Addendum)
Take Glipizide 1 tablet twice a day and  The Metformin. Please continue to monitor your sugar and eat regularly through out the day. If you do not have lows, then please follow up in about 1 month (~10/24/16). I will order cholesterol test as a future order; please come in fasting for this lab.
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
## 2016-09-17 NOTE — Assessment & Plan Note (Signed)
Encouraged patient to regularly take medications as prescribed. Asked to call clinic if having lows with this or highs (> 130). If not, then follow up in 1 month for next a1c .

## 2016-09-19 ENCOUNTER — Other Ambulatory Visit: Payer: BC Managed Care – PPO

## 2016-10-13 ENCOUNTER — Other Ambulatory Visit: Payer: Self-pay | Admitting: Internal Medicine

## 2016-10-13 DIAGNOSIS — G894 Chronic pain syndrome: Secondary | ICD-10-CM

## 2016-10-13 NOTE — Telephone Encounter (Signed)
Pt is calling because she needs a refill on her Oxycodone. Please call when ready to pick up. jw

## 2016-10-14 MED ORDER — OXYCODONE-ACETAMINOPHEN 5-325 MG PO TABS: 1.0000 | ORAL_TABLET | Freq: Three times a day (TID) | ORAL | 0 refills | Status: DC | PRN

## 2016-10-14 NOTE — Telephone Encounter (Signed)
Rx printed and placed at front desk for pick up. Please inform patient  

## 2016-10-14 NOTE — Telephone Encounter (Signed)
Patient is aware that script is ready for pick up. Jazmin Hartsell,CMA  

## 2016-10-14 NOTE — Addendum Note (Signed)
Addended by: [name] on: 10/14/2016 02:32 PM   Modules accepted: Orders

## 2016-10-14 NOTE — Telephone Encounter (Signed)
Had to re-print in clinic as initial Rx did not print in the hospital.

## 2016-10-18 ENCOUNTER — Ambulatory Visit: Payer: BC Managed Care – PPO | Admitting: Internal Medicine

## 2016-11-02 ENCOUNTER — Ambulatory Visit: Payer: BC Managed Care – PPO | Admitting: Internal Medicine
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
## 2016-11-15 ENCOUNTER — Other Ambulatory Visit: Payer: Self-pay | Admitting: Internal Medicine

## 2016-11-15 DIAGNOSIS — G894 Chronic pain syndrome: Secondary | ICD-10-CM

## 2016-11-15 NOTE — Telephone Encounter (Signed)
Pt calling to request refill of:  Name of Medication(s):  oxycodone Last date of OV:   Pharmacy:  09-16-16  Will route refill request to Clinic RN.  Discussed with patient policy to call pharmacy for future refills.  Also, discussed refills may take up to 48 hours to approve or deny.  Renella Cunas

## 2016-11-16 MED ORDER — OXYCODONE-ACETAMINOPHEN 5-325 MG PO TABS: 1.0000 | ORAL_TABLET | Freq: Three times a day (TID) | ORAL | 0 refills | Status: DC | PRN

## 2016-11-16 NOTE — Telephone Encounter (Signed)
Please let Robin [name] know that her prescription is ready to pick up at the front desk.

## 2016-11-16 NOTE — Telephone Encounter (Signed)
Patient is aware that script is ready for pick up. Joycelyn Liska,CMA  

## 2016-11-21 ENCOUNTER — Encounter: Payer: Self-pay | Admitting: Internal Medicine

## 2016-11-21 ENCOUNTER — Encounter: Payer: Self-pay | Admitting: *Deleted

## 2016-11-21 ENCOUNTER — Ambulatory Visit (INDEPENDENT_AMBULATORY_CARE_PROVIDER_SITE_OTHER): Payer: BC Managed Care – PPO | Admitting: Internal Medicine

## 2016-11-21 VITALS — BP 109/80 | HR 86 | Temp 98.3°F | Ht 64.0 in | Wt 131.0 lb

## 2016-11-21 DIAGNOSIS — E118 Type 2 diabetes mellitus with unspecified complications: Secondary | ICD-10-CM

## 2016-11-21 DIAGNOSIS — I10 Essential (primary) hypertension: Secondary | ICD-10-CM

## 2016-11-21 DIAGNOSIS — Z1211 Encounter for screening for malignant neoplasm of colon: Secondary | ICD-10-CM | POA: Diagnosis not present

## 2016-11-21 DIAGNOSIS — E782 Mixed hyperlipidemia: Secondary | ICD-10-CM

## 2016-11-21 DIAGNOSIS — E785 Hyperlipidemia, unspecified: Secondary | ICD-10-CM | POA: Diagnosis not present

## 2016-11-21 DIAGNOSIS — Z114 Encounter for screening for human immunodeficiency virus [HIV]: Secondary | ICD-10-CM

## 2016-11-21 DIAGNOSIS — S3500XD Unspecified injury of abdominal aorta, subsequent encounter: Secondary | ICD-10-CM

## 2016-11-21 DIAGNOSIS — E042 Nontoxic multinodular goiter: Secondary | ICD-10-CM | POA: Diagnosis not present

## 2016-11-21 LAB — POCT GLYCOSYLATED HEMOGLOBIN (HGB A1C): HEMOGLOBIN A1C: 8.7

## 2016-11-21 MED ORDER — FREESTYLE SYSTEM KIT: PACK | 0 refills | Status: DC
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
## 2016-11-21 NOTE — Patient Instructions (Addendum)
  Diet Recommendations for Diabetes   Starchy (carb) foods include: Bread, rice, pasta, potatoes, corn, crackers, bagels, muffins, all baked goods.  (Fruits, milk, and yogurt also have carbohydrate, but most of these foods will not spike your blood sugar as the starchy foods will.)  A few fruits do cause high blood sugars; use small portions of bananas (limit to 1/2 at a time), grapes, and most tropical fruits.    Protein foods include: Meat, fish, poultry, eggs, dairy foods, and beans such as pinto and kidney beans (beans also provide carbohydrate).   1. Eat at least 3 meals and 1-2 snacks per day. Never go more than 4-5 hours while awake without eating.  2. Limit starchy foods to TWO per meal and ONE per snack. ONE portion of a starchy  food is equal to the following:   - ONE slice of bread (or its equivalent, such as half of a hamburger bun).   - 1/2 cup of a "scoopable" starchy food such as potatoes or rice.   - 15 grams of carbohydrate as shown on food label.  3. Both lunch and dinner should include a protein food, a carb food, and vegetables.   - Obtain twice as many veg's as protein or carbohydrate foods for both lunch and dinner.   - Fresh or frozen veg's are best.   - Try to keep frozen veg's on hand for a quick vegetable serving.    4. Breakfast should always include protein.     We will get lab work today I made a referral to the GI doctor; Please make an appointment for your mammogram  Please make an appointment for your thyroid Ultrasound. The number is: [phone] Please call me or send a message through mychart in 2 weeks to review your sugar numbers.

## 2016-11-22 ENCOUNTER — Encounter: Payer: Self-pay | Admitting: Internal Medicine

## 2016-11-22 LAB — BASIC METABOLIC PANEL
BUN / CREAT RATIO: 20 (ref 12–28)
BUN: 19 mg/dL (ref 8–27)
CHLORIDE: 102 mmol/L (ref 96–106)
CO2: 20 mmol/L (ref 18–29)
Calcium: 9.7 mg/dL (ref 8.7–10.3)
Creatinine, Ser: 0.95 mg/dL (ref 0.57–1.00)
GFR calc Af Amer: 75 mL/min/{1.73_m2} (ref >59)
GFR calc non Af Amer: 65 mL/min/{1.73_m2} (ref >59)
GLUCOSE: 143 mg/dL — AB (ref 65–99)
POTASSIUM: 4.6 mmol/L (ref 3.5–5.2)
SODIUM: 141 mmol/L (ref 134–144)

## 2016-11-22 LAB — LIPID PANEL
Chol/HDL Ratio: 4.9 ratio — ABNORMAL HIGH (ref 0.0–4.4)
Cholesterol, Total: 221 mg/dL — ABNORMAL HIGH (ref 100–199)
HDL: 45 mg/dL (ref >39)
LDL Calculated: 159 mg/dL — ABNORMAL HIGH (ref 0–99)
Triglycerides: 87 mg/dL (ref 0–149)
VLDL Cholesterol Cal: 17 mg/dL (ref 5–40)

## 2016-11-22 LAB — HIV ANTIBODY (ROUTINE TESTING W REFLEX): HIV SCREEN 4TH GENERATION: NONREACTIVE

## 2016-11-22 NOTE — Assessment & Plan Note (Signed)
No symptoms. Reminded patient to follow up with [facility] per their recommendations per last note.

## 2016-11-22 NOTE — Assessment & Plan Note (Signed)
Patient needs to check cbgs regularly. Sent another prescription for glucometer. Continue metfromin and Glipizide as prescribed. Patient to call in 1-2 weeks to review cbgs.

## 2016-11-22 NOTE — Assessment & Plan Note (Signed)
Repeat lipid panel today (fasting). Continue Lipitor 40mg  daily.

## 2016-11-22 NOTE — Assessment & Plan Note (Signed)
Reminded patient to schedule appointment for thyroid US (prvided contact information).

## 2016-11-23 ENCOUNTER — Telehealth: Payer: Self-pay | Admitting: Internal Medicine

## 2016-11-23 MED ORDER — ATORVASTATIN CALCIUM 40 MG PO TABS: 40.0000 mg | ORAL_TABLET | Freq: Every day | ORAL | 0 refills | Status: DC

## 2016-11-23 NOTE — Telephone Encounter (Signed)
Called patient to discuss lab results. Lipid elevated, recommended restarting Lipitor. Patient agreeable but worried that it would cause stomach pains, therefore, only prescribed 20 pills. Reminded patient to call in 1-2 weeks to discuss cbgs.

## 2016-12-15 ENCOUNTER — Other Ambulatory Visit: Payer: Self-pay | Admitting: Internal Medicine

## 2016-12-15 DIAGNOSIS — E782 Mixed hyperlipidemia: Secondary | ICD-10-CM

## 2016-12-15 DIAGNOSIS — G894 Chronic pain syndrome: Secondary | ICD-10-CM

## 2016-12-15 NOTE — Telephone Encounter (Signed)
Pt is calling for a refill on her Oxycodone and lisinopril to be called in. She will be out of her Oxycodone before the weekend. jw

## 2016-12-16 MED ORDER — ATORVASTATIN CALCIUM 40 MG PO TABS: 40.0000 mg | ORAL_TABLET | Freq: Every day | ORAL | 0 refills | Status: DC

## 2016-12-16 MED ORDER — OXYCODONE-ACETAMINOPHEN 5-325 MG PO TABS: 1.0000 | ORAL_TABLET | Freq: Three times a day (TID) | ORAL | 0 refills | Status: DC | PRN

## 2016-12-16 NOTE — Telephone Encounter (Signed)
Gave her a refill on her Percocet, #60. [facility] database reviewed. No red flags. Last fill was on 11/18/2016. Left prescription at the front desk. Advised patient to pick it up. This prescription can be filled after 12/19/2016 Also refilled her atorvastatin. Patient was recently started on this. She denies having symptoms from this.

## 2017-01-09 ENCOUNTER — Encounter: Payer: Self-pay | Admitting: Internal Medicine

## 2017-01-09 ENCOUNTER — Ambulatory Visit (AMBULATORY_SURGERY_CENTER): Payer: Self-pay | Admitting: *Deleted

## 2017-01-09 VITALS — Ht 64.0 in | Wt 134.0 lb

## 2017-01-09 DIAGNOSIS — Z1211 Encounter for screening for malignant neoplasm of colon: Secondary | ICD-10-CM

## 2017-01-09 NOTE — Progress Notes (Signed)
No egg or soy allergy  No intubation or anesthesia problems  No home oxygen used or hx of sleep apnea  No diet medications taken

## 2017-01-16 ENCOUNTER — Other Ambulatory Visit: Payer: Self-pay | Admitting: Internal Medicine

## 2017-01-16 DIAGNOSIS — G894 Chronic pain syndrome: Secondary | ICD-10-CM

## 2017-01-16 MED ORDER — OXYCODONE-ACETAMINOPHEN 5-325 MG PO TABS: 1.0000 | ORAL_TABLET | Freq: Three times a day (TID) | ORAL | 0 refills | Status: DC | PRN

## 2017-01-16 NOTE — Telephone Encounter (Signed)
Rx printed and placed up front for pick up. Please inform patient.

## 2017-01-16 NOTE — Telephone Encounter (Signed)
Pt called for a refill on her pain medication. Please call her when this is ready for pick up . jw

## 2017-01-17 NOTE — Telephone Encounter (Signed)
Pt contacted and informed of printed rx ready for pick up.  

## 2017-01-23 ENCOUNTER — Encounter: Payer: Self-pay | Admitting: Internal Medicine

## 2017-01-23 ENCOUNTER — Ambulatory Visit (AMBULATORY_SURGERY_CENTER): Payer: BC Managed Care – PPO | Admitting: Internal Medicine

## 2017-01-23 VITALS — BP 128/79 | HR 69 | Temp 97.8°F | Resp 19 | Ht 64.0 in | Wt 134.0 lb

## 2017-01-23 DIAGNOSIS — K6389 Other specified diseases of intestine: Secondary | ICD-10-CM | POA: Diagnosis not present

## 2017-01-23 DIAGNOSIS — Z1212 Encounter for screening for malignant neoplasm of rectum: Secondary | ICD-10-CM | POA: Diagnosis not present

## 2017-01-23 DIAGNOSIS — K635 Polyp of colon: Secondary | ICD-10-CM | POA: Diagnosis not present

## 2017-01-23 DIAGNOSIS — Z1211 Encounter for screening for malignant neoplasm of colon: Secondary | ICD-10-CM

## 2017-01-23 DIAGNOSIS — D120 Benign neoplasm of cecum: Secondary | ICD-10-CM

## 2017-01-23 MED ORDER — SODIUM CHLORIDE 0.9 % IV SOLN: 500.0000 mL | INTRAVENOUS | Status: DC

## 2017-01-23 NOTE — Op Note (Signed)
[facility] Patient Name: Robin [name] Procedure Date: 01/23/2017 8:03 AM MRN: [id number] Endoscopist: Gatha Mayer , MD Age: 61 Referring MD:  Date of Birth: Aug 20, 1955 Gender: Female Account #: [id number] Procedure:                Colonoscopy Indications:              Screening for colorectal malignant neoplasm, This                            is the patient's first colonoscopy Medicines:                Propofol per Anesthesia Procedure:                Pre-Anesthesia Assessment:                           - Prior to the procedure, a History and Physical                            was performed, and patient medications and                            allergies were reviewed. The patient's tolerance of                            previous anesthesia was also reviewed. The risks                            and benefits of the procedure and the sedation                            options and risks were discussed with the patient.                            All questions were answered, and informed consent                            was obtained. Prior Anticoagulants: The patient has                            taken no previous anticoagulant or antiplatelet                            agents. ASA Grade Assessment: III - A patient with                            severe systemic disease. After reviewing the risks                            and benefits, the patient was deemed in                            satisfactory condition to undergo the procedure.  After obtaining informed consent, the colonoscope                            was passed under direct vision. Throughout the                            procedure, the patient's blood pressure, pulse, and                            oxygen saturations were monitored continuously. The                            Colonoscope was introduced through the anus and                            advanced to the  the cecum, identified by                            appendiceal orifice and ileocecal valve. The                            colonoscopy was performed without difficulty. The                            patient tolerated the procedure well. The quality                            of the bowel preparation was good. The ileocecal                            valve, appendiceal orifice, and rectum were                            photographed. The bowel preparation used was                            Miralax. Scope In: 8:22:21 AM Scope Out: 8:39:59 AM Scope Withdrawal Time: 0 hours 14 minutes 6 seconds  Total Procedure Duration: 0 hours 17 minutes 38 seconds  Findings:                 The perianal and digital rectal examinations were                            normal.                           A diminutive polyp was found in the cecum. The                            polyp was sessile. The polyp was removed with a                            cold snare. Resection and retrieval were complete.  Verification of patient identification for the                            specimen was done. Estimated blood loss was minimal.                           The exam was otherwise without abnormality on                            direct and retroflexion views. Complications:            No immediate complications. Estimated Blood Loss:     Estimated blood loss was minimal. Impression:               - One diminutive polyp in the cecum, removed with a                            cold snare. Resected and retrieved.                           - The examination was otherwise normal on direct                            and retroflexion views. Recommendation:           - Patient has a contact number available for                            emergencies. The signs and symptoms of potential                            delayed complications were discussed with the                            patient.  Return to normal activities tomorrow.                            Written discharge instructions were provided to the                            patient.                           - Resume previous diet.                           - Continue present medications.                           - Repeat colonoscopy is recommended. The                            colonoscopy date will be determined after pathology                            results from today's exam become available for  review. Gatha Mayer, MD 01/23/2017 8:47:14 AM This report has been signed electronically.

## 2017-01-23 NOTE — Progress Notes (Signed)
Kristen Moore PA-S present for procedure 

## 2017-01-23 NOTE — Patient Instructions (Addendum)
I found and removed one small polyp that looks benign.  I will let you know pathology results and when to have another routine colonoscopy by mail and/or My Chart.  I appreciate the opportunity to care for you. Gatha Mayer, MD, FACG YOU HAD AN ENDOSCOPIC PROCEDURE TODAY AT [facility]:   Refer to the procedure report that was given to you for any specific questions about what was found during the examination.  If the procedure report does not answer your questions, please call your gastroenterologist to clarify.  If you requested that your care partner not be given the details of your procedure findings, then the procedure report has been included in a sealed envelope for you to review at your convenience later.  YOU SHOULD EXPECT: Some feelings of bloating in the abdomen. Passage of more gas than usual.  Walking can help get rid of the air that was put into your GI tract during the procedure and reduce the bloating. If you had a lower endoscopy (such as a colonoscopy or flexible sigmoidoscopy) you may notice spotting of blood in your stool or on the toilet paper. If you underwent a bowel prep for your procedure, you may not have a normal bowel movement for a few days.  Please Note:  You might notice some irritation and congestion in your nose or some drainage.  This is from the oxygen used during your procedure.  There is no need for concern and it should clear up in a day or so.  SYMPTOMS TO REPORT IMMEDIATELY:   Following lower endoscopy (colonoscopy or flexible sigmoidoscopy):  Excessive amounts of blood in the stool  Significant tenderness or worsening of abdominal pains  Swelling of the abdomen that is new, acute  Fever of 100F or higher  For urgent or emergent issues, a gastroenterologist can be reached at any hour by calling [phone].   DIET:  We do recommend a small meal at first, but then you may proceed to your regular diet.  Drink plenty of  fluids but you should avoid alcoholic beverages for 24 hours.  MEDICATIONS: Continue present medications.  Please see handouts given to you by your recovery nurse.  ACTIVITY:  You should plan to take it easy for the rest of today and you should NOT DRIVE or use heavy machinery until tomorrow (because of the sedation medicines used during the test).    FOLLOW UP: Our staff will call the number listed on your records the next business day following your procedure to check on you and address any questions or concerns that you may have regarding the information given to you following your procedure. If we do not reach you, we will leave a message.  However, if you are feeling well and you are not experiencing any problems, there is no need to return our call.  We will assume that you have returned to your regular daily activities without incident.  If any biopsies were taken you will be contacted by phone or by letter within the next 1-3 weeks.  Please call us at [phone] if you have not heard about the biopsies in 3 weeks.   Thank you for allowing [place] to provide for your healthcare needs today.   SIGNATURES/CONFIDENTIALITY: You and/or your care partner have signed paperwork which will be entered into your electronic medical record.  These signatures attest to the fact that that the information above on your After Visit Summary has been reviewed and is  understood.  Full responsibility of the confidentiality of this discharge information lies with you and/or your care-partner. 

## 2017-01-23 NOTE — Progress Notes (Signed)
Called to room to assist during endoscopic procedure.  Patient ID and intended procedure confirmed with present staff. Received instructions for my participation in the procedure from the performing physician.  

## 2017-01-23 NOTE — Progress Notes (Signed)
Pt's states no medical or surgical changes since previsit or office visit.Pt's states no medical or surgical changes since previsit or office visit. 

## 2017-01-23 NOTE — Progress Notes (Signed)
A and O x3. Report to RN. Tolerated MAC anesthesia well.

## 2017-01-24 ENCOUNTER — Telehealth: Payer: Self-pay | Admitting: *Deleted

## 2017-01-24 NOTE — Telephone Encounter (Signed)
  Follow up Call-  Call back number 01/23/2017  Post procedure Call Back phone  # [phone]  Permission to leave phone message Yes  Some recent data might be hidden     Patient questions:  Do you have a fever, pain , or abdominal swelling? No. Pain Score  0 *  Have you tolerated food without any problems? Yes.    Have you been able to return to your normal activities? Yes.    Do you have any questions about your discharge instructions: Diet   No. Medications  No. Follow up visit  No.  Do you have questions or concerns about your Care? No.  Actions: * If pain score is 4 or above: No action needed, pain <4.

## 2017-01-25 ENCOUNTER — Emergency Department (HOSPITAL_COMMUNITY): Payer: BC Managed Care – PPO

## 2017-01-25 ENCOUNTER — Encounter (HOSPITAL_COMMUNITY): Payer: Self-pay | Admitting: *Deleted

## 2017-01-25 ENCOUNTER — Emergency Department (HOSPITAL_COMMUNITY)
Admission: EM | Admit: 2017-01-25 | Discharge: 2017-01-25 | Disposition: A | Discharge status: Home / Self Care | Payer: BC Managed Care – PPO | Attending: Emergency Medicine | Admitting: Emergency Medicine

## 2017-01-25 DIAGNOSIS — S72144A Nondisplaced intertrochanteric fracture of right femur, initial encounter for closed fracture: Secondary | ICD-10-CM | POA: Insufficient documentation

## 2017-01-25 DIAGNOSIS — E119 Type 2 diabetes mellitus without complications: Secondary | ICD-10-CM | POA: Diagnosis not present

## 2017-01-25 DIAGNOSIS — W52XXXA Crushed, pushed or stepped on by crowd or human stampede, initial encounter: Secondary | ICD-10-CM | POA: Diagnosis not present

## 2017-01-25 DIAGNOSIS — Z87891 Personal history of nicotine dependence: Secondary | ICD-10-CM | POA: Diagnosis not present

## 2017-01-25 DIAGNOSIS — Y999 Unspecified external cause status: Secondary | ICD-10-CM | POA: Diagnosis not present

## 2017-01-25 DIAGNOSIS — J45909 Unspecified asthma, uncomplicated: Secondary | ICD-10-CM | POA: Insufficient documentation

## 2017-01-25 DIAGNOSIS — Z7984 Long term (current) use of oral hypoglycemic drugs: Secondary | ICD-10-CM | POA: Insufficient documentation

## 2017-01-25 DIAGNOSIS — Z79899 Other long term (current) drug therapy: Secondary | ICD-10-CM | POA: Insufficient documentation

## 2017-01-25 DIAGNOSIS — I10 Essential (primary) hypertension: Secondary | ICD-10-CM | POA: Insufficient documentation

## 2017-01-25 DIAGNOSIS — W19XXXA Unspecified fall, initial encounter: Secondary | ICD-10-CM

## 2017-01-25 DIAGNOSIS — Y939 Activity, unspecified: Secondary | ICD-10-CM | POA: Diagnosis not present

## 2017-01-25 DIAGNOSIS — S79911A Unspecified injury of right hip, initial encounter: Secondary | ICD-10-CM | POA: Diagnosis present

## 2017-01-25 DIAGNOSIS — Y929 Unspecified place or not applicable: Secondary | ICD-10-CM | POA: Insufficient documentation

## 2017-01-25 DIAGNOSIS — Z7982 Long term (current) use of aspirin: Secondary | ICD-10-CM | POA: Insufficient documentation

## 2017-01-25 DIAGNOSIS — S72114A Nondisplaced fracture of greater trochanter of right femur, initial encounter for closed fracture: Secondary | ICD-10-CM | POA: Diagnosis not present

## 2017-01-25 MED ORDER — LIDOCAINE HCL 2 % IJ SOLN
20.0000 mL | Freq: Once | INTRAMUSCULAR | Status: DC
  Filled 2017-01-25: qty 20

## 2017-01-25 NOTE — Discharge Instructions (Signed)
Please read attached information. If you experience any new or worsening signs or symptoms please return to the emergency room for evaluation. Please follow-up with your primary care provider or specialist as discussed.  °
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
## 2017-01-25 NOTE — ED Notes (Signed)
Family updated.
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
## 2017-01-25 NOTE — ED Notes (Signed)
Family approached staff stating that the pt believed her sugar was dropping. Pt was approached by this EMT to take her back to triage to obtain a CBG. Family proceeded to become agitated and stated that we were not taking care of her and that they had been waiting for 5 hours. Family and pt become more agitated and frustrated with staff. Pt taken back and CBG obtained. Hassan Rowan RN made aware.

## 2017-01-26 ENCOUNTER — Encounter: Payer: Self-pay | Admitting: Internal Medicine

## 2017-01-26 DIAGNOSIS — S72113A Displaced fracture of greater trochanter of unspecified femur, initial encounter for closed fracture: Secondary | ICD-10-CM

## 2017-01-26 HISTORY — DX: Displaced fracture of greater trochanter of unspecified femur, initial encounter for closed fracture: S72.113A

## 2017-01-26 LAB — CBG MONITORING, ED: GLUCOSE-CAPILLARY: 114 mg/dL — AB (ref 65–99)

## 2017-01-27 ENCOUNTER — Telehealth (INDEPENDENT_AMBULATORY_CARE_PROVIDER_SITE_OTHER): Payer: Self-pay | Admitting: Orthopaedic Surgery

## 2017-01-27 NOTE — Telephone Encounter (Signed)
Patient called advised Dr Lorin Mercy saw her in the hospital and told her to follow up with him within a week around 03/04/17. The number to contact patient is [phone]

## 2017-01-29 ENCOUNTER — Encounter: Payer: Self-pay | Admitting: Internal Medicine

## 2017-01-29 NOTE — Progress Notes (Signed)
Lymphoid polyp 10 yr 2028 recall My Chart letter

## 2017-01-30 NOTE — Telephone Encounter (Signed)
I left voicemail for patient. I have put into Same Day slot on Friday, June 27 at 4:00pm.  I asked for her to return call if she cannot make this appt.

## 2017-01-31 ENCOUNTER — Other Ambulatory Visit: Payer: Self-pay | Admitting: Internal Medicine

## 2017-01-31 NOTE — Telephone Encounter (Signed)
Please ask patient to make follow up appointment for DM.

## 2017-01-31 NOTE — Telephone Encounter (Signed)
appt made for 02-13-17. Malory Spurr,CMA

## 2017-02-03 ENCOUNTER — Ambulatory Visit (INDEPENDENT_AMBULATORY_CARE_PROVIDER_SITE_OTHER): Payer: Self-pay

## 2017-02-03 ENCOUNTER — Encounter (INDEPENDENT_AMBULATORY_CARE_PROVIDER_SITE_OTHER): Payer: Self-pay | Admitting: Orthopaedic Surgery

## 2017-02-03 ENCOUNTER — Ambulatory Visit (INDEPENDENT_AMBULATORY_CARE_PROVIDER_SITE_OTHER): Payer: BC Managed Care – PPO | Admitting: Orthopaedic Surgery

## 2017-02-03 VITALS — BP 122/72 | HR 87 | Ht 64.0 in | Wt 135.0 lb

## 2017-02-03 DIAGNOSIS — S72114A Nondisplaced fracture of greater trochanter of right femur, initial encounter for closed fracture: Secondary | ICD-10-CM | POA: Diagnosis not present

## 2017-02-03 NOTE — Progress Notes (Signed)
Post-Op Visit Note   Patient: Robin [name]           Date of Birth: March 27, 1956           MRN: [id number] Visit Date: 02/03/2017 PCP: [name], MD   Assessment & Plan:  Chief Complaint:  Chief Complaint  Patient presents with  . Right Hip - Pain  Patient will well. Not much of pain. Compliant with nonweightbearing.  Visit Diagnoses:  1. Closed nondisplaced fracture of greater trochanter of right femur, initial encounter [facility])     Plan: Patient follow the office in 5 weeks for recheck. Continue nonweightbearing right lower extremity. Surgery not indicated.  Follow-Up Instructions: Return in about 5 weeks (around 03/10/2017).   Orders:  Orders Placed This Encounter  Procedures  . XR HIP UNILAT W OR W/O PELVIS 2-3 VIEWS RIGHT   No orders of the defined types were placed in this encounter.   Imaging: No results found.  PMFS History: Patient Active Problem List   Diagnosis Date Noted  . Greater trochanter fracture ([place]) 01/26/2017  . Healthcare maintenance 08/20/2015  . Skin lesion of right lower extremity 08/20/2015  . Acute low back pain 07/25/2014  . Encounter for chronic pain management 06/09/2014  . Tachycardia, paroxysmal ([place]) 02/24/2014  . Vaginal atrophy 02/24/2014  . Non-suicidal depressed mood 08/01/2013  . Chronic pain syndrome 07/16/2013  . Multiple thyroid nodules 06/20/2013  . Multiple fractures 06/20/2013  . Abdominal aorta injury 06/20/2013  . Fracture of tibial plateau 03/29/2013  . Pes planus 12/06/2011  . Injury of left shoulder 04/21/2011  . HYPERLIPIDEMIA 04/18/2007  . Diabetes mellitus type II, controlled ([place]) 09/07/2006  . TOBACCO DEPENDENCE 09/07/2006  . Essential hypertension, benign 09/07/2006  . RHINITIS, ALLERGIC 09/07/2006   Past Medical History:  Diagnosis Date  . Allergy   . Asthma   . ASTHMA, INTERMITTENT 09/07/2006  . Diabetes mellitus without complication ([place])   . GASTROESOPHAGEAL REFLUX, NO  ESOPHAGITIS 09/07/2006   Qualifier: Diagnosis of  By: Herma Ard    . Hyperlipidemia   . Hypertension   . IRREGULAR MENSES 09/19/2008   Qualifier: Diagnosis of  By: Sarita Haver  MD, Coralyn Mark    . Open leg wound 07/16/2013   From [facility] records-patient was placed on keflex 500mg  BID for 3 days on 06/21/13 before discharge for draining leg wound. Apparently it was cultured. Previously treated on 06/03/13 with Keflex x 7 days. Wound also noted on 05/27/13.    Marland Kitchen ROTATOR CUFF REPAIR, RIGHT, HX OF 03/11/2010   Qualifier: Diagnosis of  By: Anabel Bene    . Substance abuse    marijuana    Family History  Problem Relation Age of Onset  . Colon cancer Neg Hx   . Esophageal cancer Neg Hx   . Rectal cancer Neg Hx   . Stomach cancer Neg Hx     Past Surgical History:  Procedure Laterality Date  . CERVICAL SPINE SURGERY    . CESAREAN SECTION     x3  . FOOT SURGERY Bilateral   . HIP SURGERY     left hip x3  . right leg surgery     x10  . ROTATOR CUFF REPAIR     right arm  . SIGMOIDOSCOPY     Social History   Occupational History  . Not on file.   Social History Main Topics  . Smoking status: Former Smoker    Packs/day: 0.50    Types: Cigarettes  Quit date: 03/11/2014  . Smokeless tobacco: Never Used  . Alcohol use No  . Drug use: Yes    Types: Marijuana     Comment: occasional use  . Sexual activity: Not on file

## 2017-02-11 NOTE — Progress Notes (Signed)
   [facility] Phone: [phone]   Date of Visit: 02/13/2017   HPI:  DM2:  - Medications: Glipizide 5mg  BID, Metformin 1000mg  BID - on ASA and statin. Lipid panel up to date - vaccines up to date - has been checking her cbgs but reports that her AM cbg was 118 today. Yesterday AM was 128. Her cbg was 145 after a meal around 3:30pm - she is compliant with her medications  - no lows since being fully compliant with her medications - reports that when her sugars are 114 to 120, she does feel symptoms of hypoglycemia  Closed Non-displaced Fracture of Femur:  - seen in the ED on 7/18 - seen by orthopedics on 7/27. Follow up in 5 weeks for recheck. Continue nonweightbearing right lower extremity  - reports she has needed more of her chronic pain medication due to this acute event  HTN: - medications: Lisinopirl 40mg  daily, HCTZ 25 mg daily - reports she is compliant with her medications  - her BP on arrival was 92/54. Patient denies dizziness or lightheadedness. Reports she is feeling fine.  - repeat manual blood pressure was 108/65   ROS: See HPI.  [place]:  PMH:  HTN DM2, uncontrolled HLD Hx Abdominal Aorta Injury Paroxysmal Tachycardia Allergic Rhinitis Hx Thyroid Nodules Chronic Pain  Tobacco Use  PHYSICAL EXAM: BP (!) 92/54   Pulse 88   Temp 98.1 F (36.7 C) (Oral)   Ht 5\' 4"  (1.626 m)   Wt 126 lb (57.2 kg)   LMP 08/11/2011   BMI 21.63 kg/m  GEN: NAD, sitting on electric wheelchair CV: RRR, no murmurs, rubs, or gallops PULM: CTAB, normal effort SKIN: No rash or cyanosis; warm and well-perfused EXTR: No lower extremity edema or calf tenderness PSYCH: Mood and affect euthymic, normal rate and volume of speech NEURO: Awake, alert, no focal deficits grossly, normal speech Diabetic Foot Exam - Simple   Simple Foot Form Diabetic Foot exam was performed with the following findings:  Yes 02/13/2017  7:35 AM  Visual Inspection No deformities,  no ulcerations, no other skin breakdown bilaterally:  Yes Sensation Testing Intact to touch and monofilament testing bilaterally:  Yes Pulse Check Posterior Tibialis and Dorsalis pulse intact bilaterally:  Yes Comments     ASSESSMENT/PLAN:   Diabetes mellitus type II, controlled A1c has improved from 8.7 to 7.6. She does not have lows that she has had in the past with the current medications. We'll continue with current medications. Foot exam completed today.  Essential hypertension, benign Blood pressure is low today. Repeat blood pressure was better. The patient is asymptomatic. Discussed discontinuing one blood pressure medication and monitoring blood pressure. Patient asked if we could monitor for little while longer at home as she has been on these medications with the same doses for years. We'll continue current regimen patient to monitor blood pressure at home and inform if blood pressure is persistently low. Follow-up in 3 weeks  Chronic pain syndrome Patient recently had a closed nondisplaced femur fracture. She is currently nonweightbearing per orthopedics. Due to increased pain from this acute injury will increase number of pills by 9. Provided prescription for #69 pills.    [name], MD PGY [place]

## 2017-02-13 ENCOUNTER — Encounter: Payer: Self-pay | Admitting: Internal Medicine

## 2017-02-13 ENCOUNTER — Ambulatory Visit (INDEPENDENT_AMBULATORY_CARE_PROVIDER_SITE_OTHER): Payer: BC Managed Care – PPO | Admitting: Internal Medicine

## 2017-02-13 VITALS — BP 92/54 | HR 88 | Temp 98.1°F | Ht 64.0 in | Wt 126.0 lb

## 2017-02-13 DIAGNOSIS — G894 Chronic pain syndrome: Secondary | ICD-10-CM | POA: Diagnosis not present

## 2017-02-13 DIAGNOSIS — I10 Essential (primary) hypertension: Secondary | ICD-10-CM | POA: Diagnosis not present

## 2017-02-13 DIAGNOSIS — E042 Nontoxic multinodular goiter: Secondary | ICD-10-CM | POA: Diagnosis not present

## 2017-02-13 DIAGNOSIS — E118 Type 2 diabetes mellitus with unspecified complications: Secondary | ICD-10-CM | POA: Diagnosis not present

## 2017-02-13 LAB — POCT GLYCOSYLATED HEMOGLOBIN (HGB A1C): Hemoglobin A1C: 7.6

## 2017-02-13 MED ORDER — OXYCODONE-ACETAMINOPHEN 5-325 MG PO TABS: 1.0000 | ORAL_TABLET | Freq: Three times a day (TID) | ORAL | 0 refills | Status: DC | PRN

## 2017-02-13 NOTE — Patient Instructions (Addendum)
Monitor your blood pressure at home. Call me if it is low, if not, then make a follow up appointment in 3 weeks  We will get your thyroid ultrasound rescheduled Remember to go back to [facility] for your management of the abdominal aorta findings

## 2017-02-14 NOTE — Assessment & Plan Note (Signed)
Patient recently had a closed nondisplaced femur fracture. She is currently nonweightbearing per orthopedics. Due to increased pain from this acute injury will increase number of pills by 9. Provided prescription for #69 pills.

## 2017-02-14 NOTE — Assessment & Plan Note (Addendum)
A1c has improved from 8.7 to 7.6. She does not have lows that she has had in the past with the current medications. We'll continue with current medications. Foot exam completed today.

## 2017-02-14 NOTE — Assessment & Plan Note (Signed)
Blood pressure is low today. Repeat blood pressure was better. The patient is asymptomatic. Discussed discontinuing one blood pressure medication and monitoring blood pressure. Patient asked if we could monitor for little while longer at home as she has been on these medications with the same doses for years. We'll continue current regimen patient to monitor blood pressure at home and inform if blood pressure is persistently low. Follow-up in 3 weeks

## 2017-02-17 ENCOUNTER — Ambulatory Visit (HOSPITAL_COMMUNITY)
Admission: RE | Admit: 2017-02-17 | Discharge: 2017-02-17 | Disposition: A | Discharge status: Home / Self Care | Payer: BC Managed Care – PPO | Source: Ambulatory Visit | Attending: Family Medicine | Admitting: Family Medicine

## 2017-02-17 DIAGNOSIS — E042 Nontoxic multinodular goiter: Secondary | ICD-10-CM

## 2017-02-20 ENCOUNTER — Encounter: Payer: Self-pay | Admitting: Internal Medicine

## 2017-02-20 ENCOUNTER — Other Ambulatory Visit: Payer: Self-pay | Admitting: Internal Medicine

## 2017-02-20 DIAGNOSIS — E042 Nontoxic multinodular goiter: Secondary | ICD-10-CM

## 2017-02-20 DIAGNOSIS — G894 Chronic pain syndrome: Secondary | ICD-10-CM

## 2017-02-20 NOTE — Telephone Encounter (Signed)
Called to discuss Thyroid US with patient. There is one nodule that meets criteria for fine needle biopsy Will refer to endocrinology for this.

## 2017-02-20 NOTE — Telephone Encounter (Signed)
Pt lost her Rx for oxycodone. Pt would like to get another one. ep

## 2017-02-21 MED ORDER — OXYCODONE-ACETAMINOPHEN 5-325 MG PO TABS: 1.0000 | ORAL_TABLET | Freq: Three times a day (TID) | ORAL | 0 refills | Status: DC | PRN

## 2017-02-21 NOTE — Telephone Encounter (Signed)
Patient reports that she misplaced her paper prescription for chronic pain medication. Controlled substance database was checked and is appropriate. Printed Rx and placed at front desk for pick up.

## 2017-03-14 ENCOUNTER — Ambulatory Visit (INDEPENDENT_AMBULATORY_CARE_PROVIDER_SITE_OTHER): Payer: BC Managed Care – PPO | Admitting: Orthopaedic Surgery

## 2017-03-18 ENCOUNTER — Encounter: Payer: Self-pay | Admitting: Internal Medicine

## 2017-03-20 ENCOUNTER — Encounter: Payer: Self-pay | Admitting: Internal Medicine

## 2017-03-21 ENCOUNTER — Ambulatory Visit (INDEPENDENT_AMBULATORY_CARE_PROVIDER_SITE_OTHER): Payer: Self-pay | Admitting: Orthopaedic Surgery

## 2017-03-28 ENCOUNTER — Encounter: Payer: Self-pay | Admitting: Internal Medicine

## 2017-03-30 ENCOUNTER — Other Ambulatory Visit: Payer: Self-pay | Admitting: *Deleted

## 2017-03-30 DIAGNOSIS — G894 Chronic pain syndrome: Secondary | ICD-10-CM

## 2017-03-30 MED ORDER — OXYCODONE-ACETAMINOPHEN 5-325 MG PO TABS: 1.0000 | ORAL_TABLET | Freq: Three times a day (TID) | ORAL | 0 refills | Status: DC | PRN

## 2017-04-02 ENCOUNTER — Other Ambulatory Visit: Payer: Self-pay | Admitting: Internal Medicine

## 2017-05-03 ENCOUNTER — Encounter: Payer: Self-pay | Admitting: Internal Medicine

## 2017-05-05 ENCOUNTER — Other Ambulatory Visit: Payer: Self-pay | Admitting: Internal Medicine

## 2017-05-05 ENCOUNTER — Telehealth: Payer: Self-pay

## 2017-05-05 DIAGNOSIS — G894 Chronic pain syndrome: Secondary | ICD-10-CM

## 2017-05-05 MED ORDER — OXYCODONE-ACETAMINOPHEN 5-325 MG PO TABS: 1.0000 | ORAL_TABLET | Freq: Three times a day (TID) | ORAL | 0 refills | Status: DC | PRN

## 2017-05-05 NOTE — Progress Notes (Signed)
Reviewed controlled substance database. Her rx prescribed in September is not on the database. Called pharmacy who confirmed it was picked up on 9/25.   Refill Rx printed and placed at front desk for pick up. Please inform patient.

## 2017-05-05 NOTE — Telephone Encounter (Signed)
Reviewed controlled substance database. Her rx prescribed in September is not on the database. Called pharmacy who confirmed it was picked up on 9/25.   Refill Rx printed and placed at front desk for pick up. Please inform patient.   Pt contacted and VM was left informing pt her rx is ready for pick up at our office.

## 2017-05-09 ENCOUNTER — Other Ambulatory Visit: Payer: Self-pay | Admitting: Internal Medicine

## 2017-05-09 DIAGNOSIS — I10 Essential (primary) hypertension: Secondary | ICD-10-CM

## 2017-05-22 DIAGNOSIS — Z8709 Personal history of other diseases of the respiratory system: Secondary | ICD-10-CM | POA: Insufficient documentation

## 2017-05-22 NOTE — Progress Notes (Deleted)
   [facility] Phone: [phone]   Date of Visit: 05/23/2017   HPI:  DM2:  - Hemoglobin A1c: 7.6 (02/2017) - Medications:  - CBGs: - Symptoms:   HTN:  - at the last visit in 02/2017, blood pressure noted to be low even with repeat. We discussed discontinuing BP medication    ROS: See HPI.  [place]:  PMH: HTN Allergic Rhinitis DM2 Thyroid Nodules  HLD Tobacco Dependence  Chronic Pain Syndrome  History of Asthma  PHYSICAL EXAM: LMP 08/11/2011  Gen: *** HEENT: *** Heart: *** Lungs: *** Neuro: *** Ext: ***  ASSESSMENT/PLAN:  Health maintenance:  -***  No problem-specific Assessment & Plan notes found for this encounter.  FOLLOW UP: Follow up in *** for ***  [name], MD PGY [place]

## 2017-05-23 ENCOUNTER — Ambulatory Visit: Payer: BC Managed Care – PPO | Admitting: Internal Medicine

## 2017-05-25 ENCOUNTER — Encounter: Payer: Self-pay | Admitting: Internal Medicine

## 2017-06-04 NOTE — Progress Notes (Deleted)
   [facility] Phone: [phone]   Date of Visit: 06/05/2017   HPI:  ***  ROS: See HPI.  [place]:  PMH: HTN DM2 HLD Hx of Abdominal Aorta Injury Multiple Thyroid Nodules Tobacco Dependence Chronic Pain Syndrome History of asthma     PHYSICAL EXAM: LMP 08/11/2011  Gen: *** HEENT: *** Heart: *** Lungs: *** Neuro: *** Ext: ***  ASSESSMENT/PLAN:  Health maintenance:  -***  No problem-specific Assessment & Plan notes found for this encounter.  FOLLOW UP: Follow up in *** for ***  [name], MD PGY [place]

## 2017-06-05 ENCOUNTER — Ambulatory Visit: Payer: BC Managed Care – PPO | Admitting: Internal Medicine
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
## 2017-06-06 ENCOUNTER — Ambulatory Visit: Payer: BC Managed Care – PPO | Admitting: Internal Medicine

## 2017-06-06 ENCOUNTER — Other Ambulatory Visit: Payer: Self-pay

## 2017-06-06 ENCOUNTER — Encounter: Payer: Self-pay | Admitting: Internal Medicine

## 2017-06-06 VITALS — BP 122/60 | HR 95 | Temp 98.9°F | Ht 64.0 in | Wt 132.0 lb

## 2017-06-06 DIAGNOSIS — Z1239 Encounter for other screening for malignant neoplasm of breast: Secondary | ICD-10-CM

## 2017-06-06 DIAGNOSIS — E782 Mixed hyperlipidemia: Secondary | ICD-10-CM | POA: Diagnosis not present

## 2017-06-06 DIAGNOSIS — G894 Chronic pain syndrome: Secondary | ICD-10-CM | POA: Diagnosis not present

## 2017-06-06 DIAGNOSIS — Z1231 Encounter for screening mammogram for malignant neoplasm of breast: Secondary | ICD-10-CM | POA: Diagnosis not present

## 2017-06-06 DIAGNOSIS — Z23 Encounter for immunization: Secondary | ICD-10-CM | POA: Diagnosis not present

## 2017-06-06 DIAGNOSIS — E118 Type 2 diabetes mellitus with unspecified complications: Secondary | ICD-10-CM

## 2017-06-06 LAB — POCT GLYCOSYLATED HEMOGLOBIN (HGB A1C): Hemoglobin A1C: 7.4

## 2017-06-06 MED ORDER — ATORVASTATIN CALCIUM 40 MG PO TABS: 40.0000 mg | ORAL_TABLET | Freq: Every day | ORAL | 0 refills | Status: DC

## 2017-06-06 MED ORDER — OXYCODONE-ACETAMINOPHEN 5-325 MG PO TABS: 1.0000 | ORAL_TABLET | Freq: Three times a day (TID) | ORAL | 0 refills | Status: DC | PRN

## 2017-06-06 NOTE — Patient Instructions (Addendum)
[facility]: [phone] Please try to call [facility]   Please follow up in 4-6 weeks

## 2017-06-08 ENCOUNTER — Encounter: Payer: Self-pay | Admitting: Internal Medicine

## 2017-06-08 NOTE — Assessment & Plan Note (Signed)
Refilled Percocet.  UDS today

## 2017-06-08 NOTE — Assessment & Plan Note (Signed)
Tolerating medication and is compliant.  Refilled Lipitor.

## 2017-06-08 NOTE — Assessment & Plan Note (Signed)
A1c is not quite at goal.  Patient has noticed that she has not been following a good diet recently.  She does not want to make any changes to her medications.  She would like to try to improve A1c and sugars by diet modification and increasing physical activity.  Patient to record CBGs to bring to next visit.  Follow-up in 4-6 weeks

## 2017-06-09 ENCOUNTER — Encounter: Payer: Self-pay | Admitting: Internal Medicine

## 2017-06-10 ENCOUNTER — Other Ambulatory Visit: Payer: Self-pay | Admitting: Internal Medicine

## 2017-06-11 LAB — TOXASSURE SELECT 13 (MW), URINE

## 2017-06-13 NOTE — Telephone Encounter (Signed)
Called patient regarding her email. I think her medication is fine. I recommended that she call her pharmacy to double check the lot number for the prescription. Patient understands.

## 2017-06-22 LAB — HM DIABETES EYE EXAM

## 2017-07-06 ENCOUNTER — Encounter: Payer: Self-pay | Admitting: Endocrinology

## 2017-07-06 NOTE — Progress Notes (Deleted)
   [facility] Phone: [phone]   Date of Visit: 07/07/2017   HPI:  ***  ROS: See HPI.  [place]:  PMH:  HTN DM2, uncontrolled HLD Hx Abdominal Aorta Injury Paroxysmal Tachycardia Allergic Rhinitis Hx Thyroid Nodules Chronic Pain  Hx Tobacco Use  PHYSICAL EXAM: LMP 08/11/2011  Gen: *** HEENT: *** Heart: *** Lungs: *** Neuro: *** Ext: ***  ASSESSMENT/PLAN:  Health maintenance:  -***  No problem-specific Assessment & Plan notes found for this encounter.  FOLLOW UP: Follow up in *** for ***  [name], MD PGY [place]

## 2017-07-07 ENCOUNTER — Ambulatory Visit: Payer: BC Managed Care – PPO | Admitting: Internal Medicine

## 2017-07-18 NOTE — Progress Notes (Signed)
   [facility] Phone: [phone]   Date of Visit: 07/19/2017   HPI:  Type 2 diabetes: -Medications: Metformin 1000 mg twice daily, glipizide 5 mg twice daily -Reports she is taking medications as prescribed and has not needed to hold her glipizide medication due to concern for hypoglycemia -Her lowest CBG was 76.  Usually her sugars run between 113-121.  Although she did report she had a CBG of 151 yesterday evening prior to dinner.  She did not bring her sugar log with her  ROS: See HPI.  [place]:  PMH: HTN Allergic Rhinitis Multiple Thyroid Nodules DM2 HLD Tobacco Dependence Chronic Pain   PHYSICAL EXAM: BP 124/68   Pulse 86   Temp 98.6 F (37 C) (Oral)   Ht 5\' 4"  (1.626 m)   Wt 127 lb (57.6 kg)   LMP 08/11/2011   SpO2 99%   BMI 21.80 kg/m  GEN: NAD CV: RRR, no murmurs, rubs, or gallops PULM: CTAB, normal effort SKIN: No rash or cyanosis; warm and well-perfused EXTR: No lower extremity edema or calf tenderness PSYCH: Mood and affect euthymic, normal rate and volume of speech NEURO: Awake, alert, no focal deficits grossly, normal speech   ASSESSMENT/PLAN:  Health maintenance:  -Reminded patient to schedule mammogram for breast cancer screening  Diabetes mellitus type II, controlled CBGs appear to be stable without hypoglycemia while on glipizide.  Therefore we will not make any changes today.  Patient to follow-up at the end of February to early March for repeat A1c or sooner if she has concerns   [name], MD PGY [place]

## 2017-07-19 ENCOUNTER — Other Ambulatory Visit: Payer: Self-pay

## 2017-07-19 ENCOUNTER — Encounter: Payer: Self-pay | Admitting: Internal Medicine

## 2017-07-19 ENCOUNTER — Ambulatory Visit: Payer: BC Managed Care – PPO | Admitting: Internal Medicine

## 2017-07-19 VITALS — BP 124/68 | HR 86 | Temp 98.6°F | Ht 64.0 in | Wt 127.0 lb

## 2017-07-19 DIAGNOSIS — E118 Type 2 diabetes mellitus with unspecified complications: Secondary | ICD-10-CM

## 2017-07-19 DIAGNOSIS — G894 Chronic pain syndrome: Secondary | ICD-10-CM

## 2017-07-19 MED ORDER — OXYCODONE-ACETAMINOPHEN 5-325 MG PO TABS: 1.0000 | ORAL_TABLET | Freq: Three times a day (TID) | ORAL | 0 refills | Status: DC | PRN

## 2017-07-19 NOTE — Assessment & Plan Note (Signed)
CBGs appear to be stable without hypoglycemia while on glipizide.  Therefore we will not make any changes today.  Patient to follow-up at the end of February to early March for repeat A1c or sooner if she has concerns

## 2017-07-19 NOTE — Patient Instructions (Addendum)
[facility] Number:  [phone]  Please let your eye doctor know that we would like a copy of the eye visit.   Please follow up at the end of Feb to early March.

## 2017-07-21 ENCOUNTER — Encounter: Payer: Self-pay | Admitting: Internal Medicine

## 2017-08-07 ENCOUNTER — Ambulatory Visit: Payer: BC Managed Care – PPO

## 2017-08-10 ENCOUNTER — Encounter: Payer: Self-pay | Admitting: Internal Medicine

## 2017-08-20 ENCOUNTER — Encounter: Payer: Self-pay | Admitting: Internal Medicine

## 2017-08-20 DIAGNOSIS — G894 Chronic pain syndrome: Secondary | ICD-10-CM

## 2017-08-21 MED ORDER — OXYCODONE-ACETAMINOPHEN 5-325 MG PO TABS: 1.0000 | ORAL_TABLET | Freq: Three times a day (TID) | ORAL | 0 refills | Status: DC | PRN

## 2017-08-22 ENCOUNTER — Encounter: Payer: Self-pay | Admitting: Endocrinology

## 2017-08-22 ENCOUNTER — Ambulatory Visit: Payer: BC Managed Care – PPO | Admitting: Endocrinology

## 2017-08-22 VITALS — BP 110/60 | HR 70 | Wt 131.4 lb

## 2017-08-22 DIAGNOSIS — E042 Nontoxic multinodular goiter: Secondary | ICD-10-CM | POA: Diagnosis not present

## 2017-08-22 NOTE — Patient Instructions (Signed)
blood tests are requested for you today.  We'll let you know about the results. If it is overactive, we would do a nuclear medicine scan.   It works like this:  you go to the x-ray department of the hospital to swallow a pill, which contains a miniscule amount of radiation.  You will not notice any symptoms from this.  You will go back to the x-ray department the next day, to lie down in front of a camera.  The results of this will be sent to me.  If it is normal, we would do the biopsy, at the ultrasound office.

## 2017-08-22 NOTE — Progress Notes (Signed)
Subjective:    Patient ID: Robin [name], female    DOB: 06/15/1956, 62 y.o.   MRN: [id number]  HPI Pt is referred by Dr Dallas Schimke, for multinodular thyroid.  Pt was noted to have a nodular thyroid in 2014, incidentally on CT, but was not dx'ed until 2018.  she has no h/o XRT to the neck.  She had CT spine surg in 2009 (anterior approach).  She has slight pain at the neck, but no assoc swelling.  Past Medical History:  Diagnosis Date  . Allergy   . Asthma   . ASTHMA, INTERMITTENT 09/07/2006  . Diabetes mellitus without complication ([place])   . GASTROESOPHAGEAL REFLUX, NO ESOPHAGITIS 09/07/2006   Qualifier: Diagnosis of  By: Herma Ard    . Hyperlipidemia   . Hypertension   . IRREGULAR MENSES 09/19/2008   Qualifier: Diagnosis of  By: Sarita Haver  MD, Coralyn Mark    . Open leg wound 07/16/2013   From [facility] records-patient was placed on keflex 566m BID for 3 days on 06/21/13 before discharge for draining leg wound. Apparently it was cultured. Previously treated on 06/03/13 with Keflex x 7 days. Wound also noted on 05/27/13.    .Marland KitchenROTATOR CUFF REPAIR, RIGHT, HX OF 03/11/2010   Qualifier: Diagnosis of  By: BAnabel Bene   . Substance abuse ([place]    marijuana    Past Surgical History:  Procedure Laterality Date  . CERVICAL SPINE SURGERY    . CESAREAN SECTION     x3  . FOOT SURGERY Bilateral   . HIP SURGERY     left hip x3  . right leg surgery     x10  . ROTATOR CUFF REPAIR     right arm  . SIGMOIDOSCOPY      Social History   Socioeconomic History  . Marital status: Married    Spouse name: Not on file  . Number of children: Not on file  . Years of education: Not on file  . Highest education level: Not on file  Social Needs  . Financial resource strain: Not on file  . Food insecurity - worry: Not on file  . Food insecurity - inability: Not on file  . Transportation needs - medical: Not on file  . Transportation needs - non-medical: Not on file    Occupational History  . Not on file  Tobacco Use  . Smoking status: Former Smoker    Packs/day: 0.50    Years: 30.00    Pack years: 15.00    Types: Cigarettes    Last attempt to quit: 03/11/2014    Years since quitting: 3.4  . Smokeless tobacco: Never Used  Substance and Sexual Activity  . Alcohol use: No    Alcohol/week: 0.0 oz  . Drug use: Yes    Types: Marijuana    Comment: occasional use  . Sexual activity: Not on file  Other Topics Concern  . Not on file  Social History Narrative  . Not on file    Current Outpatient Medications on File Prior to Visit  Medication Sig Dispense Refill  . aspirin EC 81 MG tablet Take 325 mg by mouth daily.     .Marland Kitchenatorvastatin (LIPITOR) 40 MG tablet Take 1 tablet (40 mg total) by mouth daily. 90 tablet 0  . conjugated estrogens (PREMARIN) vaginal cream Place 00.08Applicatorfuls vaginally daily. Apply twice a week 42.5 g 12  . diphenhydrAMINE (BENADRYL) 25 MG tablet Take 25 mg by mouth every 6 (six)  hours as needed.    Marland Kitchen glipiZIDE (GLUCOTROL) 5 MG tablet TAKE 1 TABLET BY MOUTH TWICE DAILY BEFORE MEAL(S) 180 tablet 0  . glucose blood test strip Check blood sugars three times a day. 100 each 12  . glucose monitoring kit (FREESTYLE) monitoring kit Check blood sugars three times a day 1 each 0  . hydrochlorothiazide (HYDRODIURIL) 25 MG tablet TAKE ONE TABLET BY MOUTH ONCE DAILY 90 tablet 3  . lisinopril (PRINIVIL,ZESTRIL) 40 MG tablet TAKE ONE TABLET BY MOUTH ONCE DAILY 90 tablet 3  . metFORMIN (GLUCOPHAGE) 1000 MG tablet TAKE ONE TABLET BY MOUTH TWICE DAILY WITH A MEAL 180 tablet 3  . ONE TOUCH LANCETS MISC One touch verio lancets ICD-9 250.00 200 each 6  . oxyCODONE-acetaminophen (PERCOCET/ROXICET) 5-325 MG tablet Take 1 tablet by mouth every 8 (eight) hours as needed for severe pain. 60 tablet 0  . Insulin Syringe-Needle U-100 (INSULIN SYRINGE .3CC/31GX5/16") 31G X 5/16" 0.3 ML MISC Use to inject Lantus once daily (Patient not taking: Reported on  08/22/2017) 100 each prn   No current facility-administered medications on file prior to visit.     Allergies  Allergen Reactions  . Augmentin [Amoxicillin-Pot Clavulanate]     Causes severe yeast infection  . Tape Itching    Now using a rubbery type tape.    Family History  Problem Relation Age of Onset  . Colon cancer Neg Hx   . Esophageal cancer Neg Hx   . Rectal cancer Neg Hx   . Stomach cancer Neg Hx   . Thyroid disease Neg Hx     BP 110/60 (BP Location: Left Arm, Patient Position: Sitting, Cuff Size: Normal)   Pulse 70   Wt 131 lb 6.4 oz (59.6 kg)   LMP 08/11/2011   SpO2 97%   BMI 22.55 kg/m    Review of Systems Denies weight change, hoarseness, visual loss, chest pain, sob, cough, dysphagia, diarrhea, itching, flushing, easy bruising, depression, numbness, and rhinorrhea.  She has cold intolerance and intermitt headache.      Objective:   Physical Exam VS: see vs page GEN: no distress HEAD: head: no deformity eyes: no periorbital swelling, no proptosis external nose and ears are normal mouth: no lesion seen NECK: Neck: a healed scar is present.  I think I can feel the left thyroid nodule, but I am uncertain CHEST WALL: no deformity LUNGS: clear to auscultation CV: reg rate and rhythm, no murmur ABD: abdomen is soft, nontender.  no hepatosplenomegaly.  not distended.  no hernia MUSCULOSKELETAL: muscle bulk and strength are grossly normal.  no obvious joint swelling.  gait is steady with a cane EXTEMITIES: no deformity.  no edema PULSES: no carotid bruit NEURO:  cn 2-12 grossly intact.   readily moves all 4's.  sensation is intact to touch on all 4's SKIN:  Normal texture and temperature.  No rash or suspicious lesion is visible.   NODES:  None palpable at the neck PSYCH: alert, well-oriented.  Does not appear anxious nor depressed.     Findings suggestive of multinodular goiter.  Nodule #2 within the left lobe of the thyroid meets imaging criteria to  recommend percutaneous sampling as clinically indicated. Nodule #1 within the right lobe of the thyroid meets imaging criteria to recommend a 1 year follow-up to ensure continued stability.  CT (2014): The thyroid gland is enlarged and heterogeneous. There is a nodule in the left lobe measuring 1.9 cm A P by 1.9 cm transverse.  I have  reviewed outside records, and summarized: Pt was noted to have multinodular goiter, and referred here.  Main problem addressed was DM.     Assessment & Plan:  Multinodular goiter, new to me, uncertain etiology  Patient Instructions  blood tests are requested for you today.  We'll let you know about the results. If it is overactive, we would do a nuclear medicine scan.   It works like this:  you go to the x-ray department of the hospital to swallow a pill, which contains a miniscule amount of radiation.  You will not notice any symptoms from this.  You will go back to the x-ray department the next day, to lie down in front of a camera.  The results of this will be sent to me.  If it is normal, we would do the biopsy, at the ultrasound office.

## 2017-08-23 LAB — TSH: TSH: 1.5 u[IU]/mL (ref 0.35–4.50)

## 2017-08-23 LAB — T4, FREE: FREE T4: 1.02 ng/dL (ref 0.60–1.60)

## 2017-09-06 ENCOUNTER — Encounter: Payer: Self-pay | Admitting: Endocrinology

## 2017-09-18 DIAGNOSIS — I739 Peripheral vascular disease, unspecified: Secondary | ICD-10-CM | POA: Insufficient documentation

## 2017-09-19 ENCOUNTER — Encounter: Payer: Self-pay | Admitting: Internal Medicine

## 2017-09-19 DIAGNOSIS — G894 Chronic pain syndrome: Secondary | ICD-10-CM

## 2017-09-20 ENCOUNTER — Encounter: Payer: Self-pay | Admitting: Internal Medicine

## 2017-09-21 ENCOUNTER — Encounter: Payer: Self-pay | Admitting: Internal Medicine

## 2017-09-21 ENCOUNTER — Ambulatory Visit (INDEPENDENT_AMBULATORY_CARE_PROVIDER_SITE_OTHER): Payer: BC Managed Care – PPO | Admitting: Endocrinology

## 2017-09-21 ENCOUNTER — Encounter: Payer: Self-pay | Admitting: Endocrinology

## 2017-09-21 VITALS — BP 122/60 | HR 64 | Wt 126.6 lb

## 2017-09-21 DIAGNOSIS — E042 Nontoxic multinodular goiter: Secondary | ICD-10-CM

## 2017-09-21 MED ORDER — OXYCODONE-ACETAMINOPHEN 5-325 MG PO TABS: 1.0000 | ORAL_TABLET | Freq: Three times a day (TID) | ORAL | 0 refills | Status: DC | PRN

## 2017-09-21 NOTE — Progress Notes (Signed)
   Subjective:    Patient ID: Robin [name], female    DOB: 1956/06/28, 62 y.o.   MRN: [id number]  HPI   Review of Systems     Objective:   Physical Exam       Assessment & Plan:

## 2017-09-21 NOTE — Patient Instructions (Signed)
blood tests are requested for you today.  We'll let you know about the results. If it is overactive, we would do a nuclear medicine scan.   It works like this:  you go to the x-ray department of the hospital to swallow a pill, which contains a miniscule amount of radiation.  You will not notice any symptoms from this.  You will go back to the x-ray department the next day, to lie down in front of a camera.  The results of this will be sent to me.  If it is normal, we would do the biopsy, at the ultrasound office.

## 2017-09-22 ENCOUNTER — Other Ambulatory Visit: Payer: Self-pay | Admitting: *Deleted

## 2017-09-22 DIAGNOSIS — E782 Mixed hyperlipidemia: Secondary | ICD-10-CM

## 2017-09-22 MED ORDER — ATORVASTATIN CALCIUM 40 MG PO TABS: 40.0000 mg | ORAL_TABLET | Freq: Every day | ORAL | 3 refills | Status: DC

## 2017-09-24 NOTE — Progress Notes (Deleted)
   [facility] Phone: [phone]   Date of Visit: 09/25/2017   HPI:  ***  ROS: See HPI.  [place]:  PMH: HTN, Abdominal Aorta Injury DM2 HLD Multiple Fractures with Chronic Pain Syndrome  Allergic Rhinitis Hx Tobacco Dependence    PHYSICAL EXAM: LMP 08/11/2011  Gen: *** HEENT: *** Heart: *** Lungs: *** Neuro: *** Ext: ***  ASSESSMENT/PLAN:  Health maintenance:  -***  No problem-specific Assessment & Plan notes found for this encounter.  FOLLOW UP: Follow up in *** for ***  [name], MD PGY [place]

## 2017-09-25 ENCOUNTER — Ambulatory Visit: Payer: BC Managed Care – PPO | Admitting: Internal Medicine

## 2017-10-01 NOTE — Progress Notes (Signed)
   [facility] Phone: [phone]   Date of Visit: 10/02/2017   HPI:  DM2:  - hemoglobin a1c: 7.4 - Medications: Metformin 1000mg  BID, Glipizide 5mg  BID  - did not bring cbg log to visit but reports of sugars in the low 100s. lowes was 70.  - she has been out of glipizde for 1 week   ROS: See HPI.  [place]:  PMH: HTN  Paroxysmal Tachycarida DM2 HLD Thyroid Nodules Chronic Pain Syndrome   PHYSICAL EXAM: BP 124/78   Pulse (!) 107   Temp 98.2 F (36.8 C) (Oral)   Ht 5\' 4"  (1.626 m)   Wt 128 lb (58.1 kg)   LMP 08/11/2011   SpO2 98%   BMI 21.97 kg/m  GEN: NAD CV: RRR, no murmurs, rubs, or gallops PULM: CTAB, normal effort SKIN: No rash or cyanosis; warm and well-perfused EXTR: No lower extremity edema or calf tenderness PSYCH: Mood and affect euthymic, normal rate and volume of speech NEURO: Awake, alert, no focal deficits grossly, normal speech  ASSESSMENT/PLAN:  Health Maintenance:  - declines shingles vaccine   Diabetes mellitus type II, controlled A1c at 6.8. Doing well with Metformin and Glipizide. Gave option to switch from [place] to SGLT, however patient is not interested. Continue current regimen. Follow up in 3-4 months.   [name], MD PGY [place]

## 2017-10-02 ENCOUNTER — Other Ambulatory Visit: Payer: Self-pay | Admitting: *Deleted

## 2017-10-02 ENCOUNTER — Encounter: Payer: Self-pay | Admitting: Internal Medicine

## 2017-10-02 ENCOUNTER — Ambulatory Visit: Payer: BC Managed Care – PPO | Admitting: Internal Medicine

## 2017-10-02 ENCOUNTER — Other Ambulatory Visit: Payer: Self-pay

## 2017-10-02 VITALS — BP 124/78 | HR 107 | Temp 98.2°F | Ht 64.0 in | Wt 128.0 lb

## 2017-10-02 DIAGNOSIS — E119 Type 2 diabetes mellitus without complications: Secondary | ICD-10-CM | POA: Diagnosis not present

## 2017-10-02 LAB — POCT GLYCOSYLATED HEMOGLOBIN (HGB A1C): Hemoglobin A1C: 6.8

## 2017-10-02 MED ORDER — GLIPIZIDE 5 MG PO TABS: 5.0000 mg | ORAL_TABLET | Freq: Two times a day (BID) | ORAL | 0 refills | Status: DC

## 2017-10-02 NOTE — Patient Instructions (Addendum)
-   friendly reminder to get mammogram   Follow up in 4 months for Diabetes

## 2017-10-03 ENCOUNTER — Ambulatory Visit
Admission: RE | Admit: 2017-10-03 | Discharge: 2017-10-03 | Disposition: A | Discharge status: Home / Self Care | Payer: BC Managed Care – PPO | Source: Ambulatory Visit | Attending: Endocrinology | Admitting: Endocrinology

## 2017-10-03 ENCOUNTER — Other Ambulatory Visit (HOSPITAL_COMMUNITY)
Admission: RE | Admit: 2017-10-03 | Discharge: 2017-10-03 | Disposition: A | Discharge status: Home / Self Care | Payer: BC Managed Care – PPO | Source: Ambulatory Visit | Attending: Physician Assistant | Admitting: Physician Assistant

## 2017-10-03 ENCOUNTER — Encounter: Payer: Self-pay | Admitting: Internal Medicine

## 2017-10-03 DIAGNOSIS — E042 Nontoxic multinodular goiter: Secondary | ICD-10-CM

## 2017-10-03 DIAGNOSIS — E041 Nontoxic single thyroid nodule: Secondary | ICD-10-CM | POA: Insufficient documentation

## 2017-10-03 LAB — BASIC METABOLIC PANEL
BUN / CREAT RATIO: 15 (ref 12–28)
BUN: 15 mg/dL (ref 8–27)
CO2: 23 mmol/L (ref 20–29)
CREATININE: 0.99 mg/dL (ref 0.57–1.00)
Calcium: 9.9 mg/dL (ref 8.7–10.3)
Chloride: 102 mmol/L (ref 96–106)
GFR, EST AFRICAN AMERICAN: 71 mL/min/{1.73_m2} (ref >59)
GFR, EST NON AFRICAN AMERICAN: 62 mL/min/{1.73_m2} (ref >59)
Glucose: 95 mg/dL (ref 65–99)
POTASSIUM: 4 mmol/L (ref 3.5–5.2)
SODIUM: 141 mmol/L (ref 134–144)

## 2017-10-03 NOTE — Procedures (Signed)
PROCEDURE SUMMARY:  Using direct ultrasound guidance, 4 passes were made using 25 g needles into the nodule within the left lobe of the thyroid.   Ultrasound was used to confirm needle placements on all occasions.   Specimens were sent to Pathology for analysis.  See procedure note under Imaging tab in Epic for full procedure details.  Albaro Deviney S Laree Garron PA-C 10/03/2017 4:15 PM

## 2017-10-03 NOTE — Assessment & Plan Note (Signed)
A1c at 6.8. Doing well with Metformin and Glipizide. Gave option to switch from [place] to SGLT, however patient is not interested. Continue current regimen. Follow up in 3-4 months.

## 2017-10-19 ENCOUNTER — Encounter: Payer: Self-pay | Admitting: Internal Medicine

## 2017-10-19 DIAGNOSIS — G894 Chronic pain syndrome: Secondary | ICD-10-CM

## 2017-10-20 ENCOUNTER — Encounter: Payer: Self-pay | Admitting: Internal Medicine

## 2017-10-20 MED ORDER — OXYCODONE-ACETAMINOPHEN 5-325 MG PO TABS: 1.0000 | ORAL_TABLET | Freq: Three times a day (TID) | ORAL | 0 refills | Status: DC | PRN

## 2017-11-22 ENCOUNTER — Encounter: Payer: Self-pay | Admitting: Internal Medicine

## 2017-11-22 DIAGNOSIS — G894 Chronic pain syndrome: Secondary | ICD-10-CM

## 2017-11-23 ENCOUNTER — Encounter: Payer: Self-pay | Admitting: Internal Medicine

## 2017-11-23 MED ORDER — OXYCODONE-ACETAMINOPHEN 5-325 MG PO TABS: 1.0000 | ORAL_TABLET | Freq: Three times a day (TID) | ORAL | 0 refills | Status: DC | PRN

## 2017-12-26 ENCOUNTER — Telehealth: Payer: Self-pay | Admitting: *Deleted

## 2017-12-26 ENCOUNTER — Encounter: Payer: Self-pay | Admitting: Internal Medicine

## 2017-12-26 DIAGNOSIS — G894 Chronic pain syndrome: Secondary | ICD-10-CM

## 2017-12-26 MED ORDER — OXYCODONE-ACETAMINOPHEN 5-325 MG PO TABS: 1.0000 | ORAL_TABLET | Freq: Three times a day (TID) | ORAL | 0 refills | Status: DC | PRN

## 2017-12-26 NOTE — Telephone Encounter (Signed)
Controlled substance database reviewed and appropriate.

## 2017-12-26 NOTE — Telephone Encounter (Signed)
Resent

## 2017-12-26 NOTE — Telephone Encounter (Signed)
Jazmin from [place] called to let us know that she accidentally wrote the DEA number on the script for oxycodone.  Unfortunately, that voids it.  She is requesting that a new one be sent. Harla Mensch, Salome Spotted, CMA

## 2018-01-15 ENCOUNTER — Encounter: Payer: Self-pay | Admitting: Internal Medicine

## 2018-01-28 ENCOUNTER — Encounter: Payer: Self-pay | Admitting: Family Medicine

## 2018-01-29 ENCOUNTER — Other Ambulatory Visit: Payer: Self-pay | Admitting: Family Medicine

## 2018-01-29 ENCOUNTER — Encounter: Payer: Self-pay | Admitting: Family Medicine

## 2018-01-29 DIAGNOSIS — G894 Chronic pain syndrome: Secondary | ICD-10-CM

## 2018-01-29 MED ORDER — OXYCODONE-ACETAMINOPHEN 5-325 MG PO TABS: 1.0000 | ORAL_TABLET | Freq: Three times a day (TID) | ORAL | 0 refills | Status: DC | PRN

## 2018-01-29 NOTE — Progress Notes (Signed)
Patient refill sent in for her percocet. Will discuss other options for chronic pain management at our appointment in August.

## 2018-02-06 ENCOUNTER — Other Ambulatory Visit: Payer: Self-pay | Admitting: Internal Medicine

## 2018-02-14 ENCOUNTER — Ambulatory Visit: Payer: BC Managed Care – PPO | Admitting: Family Medicine

## 2018-02-14 ENCOUNTER — Other Ambulatory Visit: Payer: Self-pay

## 2018-02-14 ENCOUNTER — Encounter: Payer: Self-pay | Admitting: Family Medicine

## 2018-02-14 VITALS — BP 138/68 | HR 78 | Temp 98.9°F | Wt 132.6 lb

## 2018-02-14 DIAGNOSIS — J309 Allergic rhinitis, unspecified: Secondary | ICD-10-CM | POA: Diagnosis not present

## 2018-02-14 DIAGNOSIS — E118 Type 2 diabetes mellitus with unspecified complications: Secondary | ICD-10-CM

## 2018-02-14 DIAGNOSIS — Z720 Tobacco use: Secondary | ICD-10-CM | POA: Diagnosis not present

## 2018-02-14 LAB — POCT GLYCOSYLATED HEMOGLOBIN (HGB A1C): HBA1C, POC (CONTROLLED DIABETIC RANGE): 7.2 % — AB (ref 0.0–7.0)

## 2018-02-14 MED ORDER — FLUTICASONE PROPIONATE 50 MCG/ACT NA SUSP: 2.0000 | Freq: Every day | NASAL | 6 refills | Status: DC

## 2018-02-14 MED ORDER — VARENICLINE TARTRATE 0.5 MG X 11 & 1 MG X 42 PO MISC: ORAL | 0 refills | Status: DC

## 2018-02-14 NOTE — Progress Notes (Signed)
   Subjective:    Patient ID: Robin [name], female    DOB: 28-May-1956, 62 y.o.   MRN: [id number]   CC: DM f/u  HPI:  Diabetes:  Last A1c 6.8 on 10/02/2017; 7.2 today Taking medications: metformin 1000mg  BID, glipizide 5mg  BID On Aspirin, and on statin Last eye exam: recently  Last foot exam: up to date ROS: denies dizziness, diaphoresis, LOC, polyuria, polydipsia CBG's at home most in 100's. Patient does report some lows in the 40's on 2 occasions where she drank a soda and it came back up. Patient diligent about checking her sugars.  Patient not interested in stopping glipizide.   Health Maintenance: Patient in need of mammogram Will check cholesterol levels today.   R Ear fullness/ allergies: Patient reports some fullness and itching in  Her right ear occasionally. She uses benadryl which helps but it is not always helping. She has this happen about once every other week. No trouble hearing, numbness, tinnitus, or tearing   Smoking status reviewed. Patient interested in stopping. She smokes 5-6 cigarettes per day. She has tried chantix in the past with good results.   Review of Systems Per HPI, also denies recent illness, fever, headache, chest pain, shortness of breath   Patient Active Problem List   Diagnosis Date Noted  . Smoking trying to quit 02/16/2018  . History of asthma 05/22/2017  . Greater trochanter fracture ([place]) 01/26/2017  . Skin lesion of right lower extremity 08/20/2015  . Encounter for chronic pain management 06/09/2014  . Tachycardia, paroxysmal ([place]) 02/24/2014  . Vaginal atrophy 02/24/2014  . Chronic pain syndrome 07/16/2013  . Multiple thyroid nodules 06/20/2013  . Multiple fractures 06/20/2013  . Abdominal aorta injury 06/20/2013  . Fracture of tibial plateau 03/29/2013  . Pes planus 12/06/2011  . Injury of left shoulder 04/21/2011  . HYPERLIPIDEMIA 04/18/2007  . Diabetes mellitus type II, controlled ([place]) 09/07/2006  . TOBACCO  DEPENDENCE 09/07/2006  . Essential hypertension, benign 09/07/2006  . Allergic rhinitis 09/07/2006     Objective:  BP 138/68   Pulse 78   Temp 98.9 F (37.2 C) (Oral)   Wt 132 lb 9.6 oz (60.1 kg)   LMP 08/11/2011   SpO2 98%   BMI 22.76 kg/m  Vitals and nursing note reviewed  General: NAD, pleasant HEENT: TM wnl BL, oropharynx clear with no erythema, no cervical LAD Cardiac: RRR, normal heart sounds, no murmurs Respiratory: CTAB, normal effort Extremities: no edema or cyanosis. WWP. Skin: warm and dry, no rashes noted Neuro: alert and oriented, no focal deficits Psych: normal affect  Assessment & Plan:    Allergic rhinitis Likely causing R ear fullness. Patient to start flonase to help and continue use benadryl as needed  Diabetes mellitus type II, controlled A1c is 7.2 today. Patient with some hypoglycemia, but again not interested in switching from glipizide to SGLT2. Patient given some information on SGLT2 to read about to decide if she would like to switch at follow up in 3 months. Patient to continue current regimen of metformin and glipizide until then.   Smoking trying to quit Patient has previously been successful with chantix and requesting new perscription today as she has started smoking more.   Health Maintenance: Patient in need of mammogram Lipid panel wnl, patient to continue current statin therapy  Hypertension: BP 138/68 today. Will continue to monitor closely and patient to continue HCTZ   [place] Corrine Tillis, [facility] Resident PGY-2

## 2018-02-14 NOTE — Patient Instructions (Signed)
Thank you for coming to see me today. It was a pleasure! Today we talked about:   Your diabetes. Please continue your current medications. I have attached some information on SGLT-2 therapies, such as Jardiance, so that you may decide to switch medications.   Your cholesterol: We tested your cholesterol levels today and we will call you with the results.   I have sent flonase to your pharmacy to use daily to help with your ear and sinus drainage. You may use benadryl as needed at night when your symptoms are worse.   I have sent chantix to your pharmacy to help you with stopping smoking. Please call 1-800-QUIT-NOW for any further help with smoking cessation.   Please follow-up with me in 3 months or sooner as needed.  If you have any questions or concerns, please do not hesitate to call the office at [phone].  Take Care,   [place] Linday Rhodes, DO   SGLT2 inhibitors-The sodium-glucose co-transporter 2 (SGLT2) inhibitors, canagliflozin (brand name: Invokana), empagliflozin (brand name: Jardiance), and dapagliflozin (brand name: Wilder Glade), lower blood sugar by increasing the excretion of sugar in the urine. They are variably effective, but on average, they are similar in potency to the DPP-4 inhibitors (see 'DPP-4 inhibitors' above). Canagliflozin or empagliflozin may be a good choice for patients with cardiovascular disease with or without heart failure or mild kidney disease because they have been shown to have some cardiovascular, renal, or mortality benefits.   SGLT2 inhibitors do not cause low blood sugar. They promote modest weight loss and blood pressure reduction. Side effects include genital yeast infections in men and women, urinary tract infections, and dehydration.

## 2018-02-15 LAB — LIPID PANEL
Chol/HDL Ratio: 2.9 ratio (ref 0.0–4.4)
Cholesterol, Total: 143 mg/dL (ref 100–199)
HDL: 50 mg/dL (ref >39)
LDL Calculated: 87 mg/dL (ref 0–99)
Triglycerides: 31 mg/dL (ref 0–149)
VLDL CHOLESTEROL CAL: 6 mg/dL (ref 5–40)

## 2018-02-16 DIAGNOSIS — Z720 Tobacco use: Secondary | ICD-10-CM | POA: Insufficient documentation

## 2018-02-16 NOTE — Assessment & Plan Note (Signed)
Patient has previously been successful with chantix and requesting new perscription today as she has started smoking more.

## 2018-02-16 NOTE — Assessment & Plan Note (Signed)
Likely causing R ear fullness. Patient to start flonase to help and continue use benadryl as needed

## 2018-02-16 NOTE — Assessment & Plan Note (Signed)
A1c is 7.2 today. Patient with some hypoglycemia, but again not interested in switching from glipizide to SGLT2. Patient given some information on SGLT2 to read about to decide if she would like to switch at follow up in 3 months. Patient to continue current regimen of metformin and glipizide until then.

## 2018-03-01 ENCOUNTER — Encounter: Payer: Self-pay | Admitting: Family Medicine

## 2018-03-05 ENCOUNTER — Encounter: Payer: Self-pay | Admitting: Family Medicine

## 2018-03-05 ENCOUNTER — Other Ambulatory Visit: Payer: Self-pay | Admitting: Family Medicine

## 2018-03-05 DIAGNOSIS — G894 Chronic pain syndrome: Secondary | ICD-10-CM

## 2018-03-05 MED ORDER — OXYCODONE-ACETAMINOPHEN 5-325 MG PO TABS: 1.0000 | ORAL_TABLET | Freq: Two times a day (BID) | ORAL | 0 refills | Status: DC | PRN

## 2018-04-04 ENCOUNTER — Encounter: Payer: Self-pay | Admitting: Family Medicine

## 2018-04-06 ENCOUNTER — Encounter: Payer: Self-pay | Admitting: Family Medicine

## 2018-04-06 ENCOUNTER — Other Ambulatory Visit: Payer: Self-pay | Admitting: Family Medicine

## 2018-04-06 DIAGNOSIS — G894 Chronic pain syndrome: Secondary | ICD-10-CM

## 2018-04-06 MED ORDER — OXYCODONE-ACETAMINOPHEN 5-325 MG PO TABS: 1.0000 | ORAL_TABLET | Freq: Two times a day (BID) | ORAL | 0 refills | Status: DC | PRN

## 2018-05-08 ENCOUNTER — Encounter: Payer: Self-pay | Admitting: Family Medicine

## 2018-05-09 ENCOUNTER — Encounter: Payer: Self-pay | Admitting: Family Medicine

## 2018-05-09 ENCOUNTER — Other Ambulatory Visit: Payer: Self-pay | Admitting: Family Medicine

## 2018-05-09 DIAGNOSIS — G894 Chronic pain syndrome: Secondary | ICD-10-CM

## 2018-05-09 DIAGNOSIS — I739 Peripheral vascular disease, unspecified: Secondary | ICD-10-CM

## 2018-05-09 DIAGNOSIS — I10 Essential (primary) hypertension: Secondary | ICD-10-CM

## 2018-05-09 DIAGNOSIS — S3500XD Unspecified injury of abdominal aorta, subsequent encounter: Secondary | ICD-10-CM

## 2018-05-09 DIAGNOSIS — E782 Mixed hyperlipidemia: Secondary | ICD-10-CM

## 2018-05-09 MED ORDER — LISINOPRIL 40 MG PO TABS: 40.0000 mg | ORAL_TABLET | Freq: Every day | ORAL | 3 refills | Status: DC

## 2018-05-09 MED ORDER — GLIPIZIDE 5 MG PO TABS: ORAL_TABLET | ORAL | 0 refills | Status: DC

## 2018-05-09 MED ORDER — FLUTICASONE PROPIONATE 50 MCG/ACT NA SUSP: 2.0000 | Freq: Every day | NASAL | 6 refills | Status: DC

## 2018-05-09 MED ORDER — ATORVASTATIN CALCIUM 40 MG PO TABS: 40.0000 mg | ORAL_TABLET | Freq: Every day | ORAL | 3 refills | Status: DC

## 2018-05-09 MED ORDER — HYDROCHLOROTHIAZIDE 25 MG PO TABS: 25.0000 mg | ORAL_TABLET | Freq: Every day | ORAL | 3 refills | Status: DC

## 2018-05-09 MED ORDER — METFORMIN HCL 1000 MG PO TABS: 1000.0000 mg | ORAL_TABLET | Freq: Two times a day (BID) | ORAL | 3 refills | Status: DC

## 2018-05-09 MED ORDER — OXYCODONE-ACETAMINOPHEN 5-325 MG PO TABS: 1.0000 | ORAL_TABLET | Freq: Two times a day (BID) | ORAL | 0 refills | Status: DC | PRN

## 2018-05-09 MED ORDER — ASPIRIN 325 MG PO TABS: 325.0000 mg | ORAL_TABLET | Freq: Every day | ORAL | 3 refills | Status: DC

## 2018-05-09 NOTE — Progress Notes (Signed)
Refilled patient's medications per request. Reviewed [place]MP[place] for controlled substances, and patient refilling appropriately.   Robin Vania Rosero, DO PGY-2, [facility]

## 2018-05-17 ENCOUNTER — Other Ambulatory Visit: Payer: Self-pay

## 2018-05-17 ENCOUNTER — Ambulatory Visit (INDEPENDENT_AMBULATORY_CARE_PROVIDER_SITE_OTHER): Payer: BC Managed Care – PPO | Admitting: Family Medicine

## 2018-05-17 VITALS — BP 128/60 | HR 82 | Temp 98.4°F | Ht 64.0 in | Wt 128.0 lb

## 2018-05-17 DIAGNOSIS — Z23 Encounter for immunization: Secondary | ICD-10-CM

## 2018-05-17 DIAGNOSIS — Z1239 Encounter for other screening for malignant neoplasm of breast: Secondary | ICD-10-CM

## 2018-05-17 DIAGNOSIS — G894 Chronic pain syndrome: Secondary | ICD-10-CM | POA: Diagnosis not present

## 2018-05-17 DIAGNOSIS — E118 Type 2 diabetes mellitus with unspecified complications: Secondary | ICD-10-CM

## 2018-05-17 DIAGNOSIS — S3500XS Unspecified injury of abdominal aorta, sequela: Secondary | ICD-10-CM

## 2018-05-17 DIAGNOSIS — F17200 Nicotine dependence, unspecified, uncomplicated: Secondary | ICD-10-CM

## 2018-05-17 DIAGNOSIS — I10 Essential (primary) hypertension: Secondary | ICD-10-CM

## 2018-05-17 LAB — POCT GLYCOSYLATED HEMOGLOBIN (HGB A1C): HBA1C, POC (CONTROLLED DIABETIC RANGE): 7.7 % — AB (ref 0.0–7.0)

## 2018-05-17 MED ORDER — DICLOFENAC SODIUM 1 % TD GEL: 4.0000 g | Freq: Four times a day (QID) | TRANSDERMAL | 2 refills | Status: DC

## 2018-05-17 MED ORDER — VARENICLINE TARTRATE 0.5 MG X 11 & 1 MG X 42 PO MISC: ORAL | 0 refills | Status: DC

## 2018-05-17 MED ORDER — EMPAGLIFLOZIN 10 MG PO TABS: 10.0000 mg | ORAL_TABLET | Freq: Every day | ORAL | 2 refills | Status: DC
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
## 2018-05-17 NOTE — Patient Instructions (Addendum)
Thank you for coming to see me today. It was a pleasure! Today we talked about:   Your diabetes: Please stop taking glipizide. Please start taking Jardiance 10mg  daily. Please continue check your blood sugar at least once per day.   Please schedule your mammogram.   For you pain, please use tylenol 650mg  up to twice per day to help with your pain. I have sent diclofenac gel to also help with your pain.   Please follow-up with me in 3 months or sooner as needed.  If you have any questions or concerns, please do not hesitate to call the office at [phone].  Take Care,   [place] Luismiguel Lamere, DO  Empagliflozin oral tablets What is this medicine? EMPAGLIFLOZIN (EM pa gli FLOE zin) helps to treat type 2 diabetes. It helps to control blood sugar. This drug may also reduce the risk of heart attack or stroke if you have type 2 diabetes and risk factors for heart disease. Treatment is combined with diet and exercise. This medicine may be used for other purposes; ask your health care provider or pharmacist if you have questions. COMMON BRAND NAME(S): JARDIANCE What should I tell my health care provider before I take this medicine? They need to know if you have any of these conditions: -dehydration -diabetic ketoacidosis -diet low in salt -eating less due to illness, surgery, dieting, or any other reason -having surgery -high cholesterol -high levels of potassium in the blood -history of pancreatitis or pancreas problems -history of yeast infection of the penis or vagina -if you often drink alcohol -infections in the bladder, kidneys, or urinary tract -kidney disease -liver disease -low blood pressure -on hemodialysis -problems urinating -type 1 diabetes -uncircumcised female -an unusual or allergic reaction to empagliflozin, other medicines, foods, dyes, or preservatives -pregnant or trying to get pregnant -breast-feeding How should I use this medicine? Take this medicine by mouth  with a glass of water. Follow the directions on the prescription label. Take it in the morning, with or without food. Take your dose at the same time each day. Do not take more often than directed. Do not stop taking except on your doctor's advice. Talk to your pediatrician regarding the use of this medicine in children. Special care may be needed. Overdosage: If you think you have taken too much of this medicine contact a poison control center or emergency room at once. NOTE: This medicine is only for you. Do not share this medicine with others. What if I miss a dose? If you miss a dose, take it as soon as you can. If it is almost time for your next dose, take only that dose. Do not take double or extra doses. What may interact with this medicine? Do not take this medicine with any of the following medications: -gatifloxacin This medicine may also interact with the following medications: -alcohol -certain medicines for blood pressure, heart disease -diuretics This list may not describe all possible interactions. Give your health care provider a list of all the medicines, herbs, non-prescription drugs, or dietary supplements you use. Also tell them if you smoke, drink alcohol, or use illegal drugs. Some items may interact with your medicine. What should I watch for while using this medicine? Visit your doctor or health care professional for regular checks on your progress. This medicine can cause a serious condition in which there is too much acid in the blood. If you develop nausea, vomiting, stomach pain, unusual tiredness, or breathing problems, stop taking this medicine  and call your doctor right away. If possible, use a ketone dipstick to check for ketones in your urine. A test called the HbA1C (A1C) will be monitored. This is a simple blood test. It measures your blood sugar control over the last 2 to 3 months. You will receive this test every 3 to 6 months. Learn how to check your blood  sugar. Learn the symptoms of low and high blood sugar and how to manage them. Always carry a quick-source of sugar with you in case you have symptoms of low blood sugar. Examples include hard sugar candy or glucose tablets. Make sure others know that you can choke if you eat or drink when you develop serious symptoms of low blood sugar, such as seizures or unconsciousness. They must get medical help at once. Tell your doctor or health care professional if you have high blood sugar. You might need to change the dose of your medicine. If you are sick or exercising more than usual, you might need to change the dose of your medicine. Do not skip meals. Ask your doctor or health care professional if you should avoid alcohol. Many nonprescription cough and cold products contain sugar or alcohol. These can affect blood sugar. Wear a medical ID bracelet or chain, and carry a card that describes your disease and details of your medicine and dosage times. What side effects may I notice from receiving this medicine? Side effects that you should report to your doctor or health care professional as soon as possible: -allergic reactions like skin rash, itching or hives, swelling of the face, lips, or tongue -breathing problems -dizziness -fast or irregular heartbeat -feeling faint or lightheaded, falls -muscle weakness -nausea, vomiting, unusual stomach upset or pain -signs and symptoms of low blood sugar such as feeling anxious, confusion, dizziness, increased hunger, unusually weak or tired, sweating, shakiness, cold, irritable, headache, blurred vision, fast heartbeat, loss of consciousness -signs and symptoms of a urinary tract infection, such as fever, chills, a burning feeling when urinating, blood in the urine, back pain -trouble passing urine or change in the amount of urine, including an urgent need to urinate more often, in larger amounts, or at night -penile discharge, itching, or pain in men -unusual  tiredness -vaginal discharge, itching, or odor in women Side effects that usually do not require medical attention (report to your doctor or health care professional if they continue or are bothersome): -joint pain -mild increase in urination -thirsty This list may not describe all possible side effects. Call your doctor for medical advice about side effects. You may report side effects to FDA at 1-800-FDA-1088. Where should I keep my medicine? Keep out of the reach of children. Store at room temperature between 20 and 25 degrees C (68 and 77 degrees F). Throw away any unused medicine after the expiration date. NOTE: This sheet is a summary. It may not cover all possible information. If you have questions about this medicine, talk to your doctor, pharmacist, or health care provider.  2018 Elsevier/Gold Standard (2015-07-30 11:46:10)

## 2018-05-23 NOTE — Assessment & Plan Note (Signed)
Patient given refill of chantix and will continue after she has quit for suppression.

## 2018-05-23 NOTE — Assessment & Plan Note (Signed)
Seen by vascular surgery at [facility]. Will get repeat CT in 6 months.

## 2018-05-23 NOTE — Assessment & Plan Note (Signed)
Reviewed [place]MP[place] and no red flags noted. Patient to continue tylenol 650mg  up to twice per day to help with your pain. Refilled diclofenac gel to also help with your pain. Patient encouraged to decrease amount of percocet and counseled on risks of long term use of this medication.

## 2018-05-23 NOTE — Assessment & Plan Note (Signed)
A1c 7.7 today. Patient also reporting some continued lows, but knows she has had poor eating habits. Will switch glipizide to Jardiance. Patient given handout and encouraged to stay hydrated while on this medication. Patient also encouraged to eat better.

## 2018-05-23 NOTE — Assessment & Plan Note (Signed)
BP wnl today. Continue current medications.

## 2018-06-13 ENCOUNTER — Other Ambulatory Visit: Payer: Self-pay | Admitting: *Deleted

## 2018-06-13 DIAGNOSIS — G894 Chronic pain syndrome: Secondary | ICD-10-CM

## 2018-06-14 MED ORDER — OXYCODONE-ACETAMINOPHEN 5-325 MG PO TABS: 1.0000 | ORAL_TABLET | Freq: Two times a day (BID) | ORAL | 0 refills | Status: DC | PRN

## 2018-06-14 NOTE — Telephone Encounter (Signed)
Reviewed PMP[place], patient appears to be taking medications appropriately and discussed risks of these medications at our last office visit.

## 2018-07-12 ENCOUNTER — Encounter: Payer: Self-pay | Admitting: Family Medicine

## 2018-07-12 ENCOUNTER — Other Ambulatory Visit: Payer: Self-pay

## 2018-07-12 DIAGNOSIS — G894 Chronic pain syndrome: Secondary | ICD-10-CM

## 2018-07-12 NOTE — Telephone Encounter (Signed)
Patient calling for refill of Percocet which is due to be filled this Sunday.  Call back is [phone]  Danley Danker, RN [facility] [place])

## 2018-07-14 MED ORDER — OXYCODONE-ACETAMINOPHEN 5-325 MG PO TABS: 1.0000 | ORAL_TABLET | Freq: Two times a day (BID) | ORAL | 0 refills | Status: DC | PRN

## 2018-08-13 ENCOUNTER — Encounter: Payer: Self-pay | Admitting: Family Medicine

## 2018-08-13 ENCOUNTER — Other Ambulatory Visit: Payer: Self-pay | Admitting: Family Medicine

## 2018-08-13 DIAGNOSIS — G894 Chronic pain syndrome: Secondary | ICD-10-CM

## 2018-08-13 MED ORDER — OXYCODONE-ACETAMINOPHEN 5-325 MG PO TABS: 1.0000 | ORAL_TABLET | Freq: Two times a day (BID) | ORAL | 0 refills | Status: DC | PRN

## 2018-09-07 ENCOUNTER — Other Ambulatory Visit: Payer: Self-pay

## 2018-09-07 ENCOUNTER — Encounter: Payer: Self-pay | Admitting: Family Medicine

## 2018-09-07 ENCOUNTER — Ambulatory Visit: Payer: BC Managed Care – PPO | Admitting: Family Medicine

## 2018-09-07 VITALS — BP 120/62 | HR 96 | Temp 98.5°F | Wt 122.2 lb

## 2018-09-07 DIAGNOSIS — Z23 Encounter for immunization: Secondary | ICD-10-CM | POA: Diagnosis not present

## 2018-09-07 DIAGNOSIS — R42 Dizziness and giddiness: Secondary | ICD-10-CM

## 2018-09-07 DIAGNOSIS — E118 Type 2 diabetes mellitus with unspecified complications: Secondary | ICD-10-CM | POA: Diagnosis not present

## 2018-09-07 DIAGNOSIS — N952 Postmenopausal atrophic vaginitis: Secondary | ICD-10-CM

## 2018-09-07 DIAGNOSIS — E042 Nontoxic multinodular goiter: Secondary | ICD-10-CM | POA: Diagnosis not present

## 2018-09-07 DIAGNOSIS — F17200 Nicotine dependence, unspecified, uncomplicated: Secondary | ICD-10-CM

## 2018-09-07 LAB — POCT GLYCOSYLATED HEMOGLOBIN (HGB A1C): HBA1C, POC (CONTROLLED DIABETIC RANGE): 7.1 % — AB (ref 0.0–7.0)

## 2018-09-07 MED ORDER — VARENICLINE TARTRATE 0.5 MG X 11 & 1 MG X 42 PO MISC: ORAL | 0 refills | Status: DC

## 2018-09-07 MED ORDER — ESTROGENS, CONJUGATED 0.625 MG/GM VA CREA: 0.5000 g | TOPICAL_CREAM | Freq: Every day | VAGINAL | 12 refills | Status: DC

## 2018-09-07 NOTE — Assessment & Plan Note (Signed)
Patient with positive orthostatics by HR in office. Instructed to stand up slowly, stay hydrated, wear compression stockings and ensure she is not going without food.  - Patient will need to return for lab visit for EKG, BMP and CBC given that she continues to be dizzy.

## 2018-09-07 NOTE — Patient Instructions (Signed)
Thank you for coming to see me today. It was a pleasure! Today we talked about:   Please schedule an appointment with your eye doctor.   Continue the good work on your diabetes! You are likely feeling dizzy because when you stand up your heart rate goes a little bit too high.  It is clinically important that you get up slowly.  That you continue to drink plenty of water and they wear compression stockings.  Please follow-up with me in 3 months or as needed.  If you have any questions or concerns, please do not hesitate to call the office at [phone].  Take Care,   [place] Julissa Browning, DO

## 2018-09-07 NOTE — Progress Notes (Signed)
Subjective:  Patient ID: Robin [name]  DOB: 06-18-1956 MRN: [id number]  Robin [name] is a 63 y.o. female with a PMH of T2DM, PAD, HTN, history of abdominal aortic penetrating ulcer, tobacco use disorder here today for follow-up for diabetes, dizziness.   HPI:  Diabetes:  Last A1c 7.7 on 05/23/2018  Taking medications: metformin, Jardiance On Aspirin, and on statin Last eye exam:  Patient is to reschedule as she missed her appointment in December Last foot exam: up to date ROS: denies diaphoresis, LOC, polyuria, polydipsia  Dizziness Patient reports that she is dizzy often. Thinks it has not improved since stopping glipizide and starting jardiance.  Reports that when she is getting dizzy she has not checked her CBG.  States that it only happens when she bends down to pick something up or when she stands up quickly.  Patient states that she has not fallen.  States that she will feel lightheaded for couple of minutes and then will completely resolve after she rests.  Patient denies any headaches, change in vision, leg swelling. ROS: Denies any chest pain, shortness of breath  Thyroid nodules Per chart review patient had ultrasound of thyroid in 02/2017. 1. Findings suggestive of multinodular goiter. 2. Nodule #2 within the left lobe of the thyroid meets imaging criteria to recommend percutaneous sampling as clinically indicated. 3. Nodule #1 within the right lobe of the thyroid meets imaging criteria to recommend a 1 year follow-up to ensure continued stability.  ROS: All other systems otherwise negative, except as mentioned in HPI  Social hx: Denies use of illicit drugs Smoking status reviewed-she reports that she is still smoking 1/2 pack/day and is interested in having a new prescription of Chantix that as she is able to stop smoking when she is on Chantix.  Patient Active Problem List   Diagnosis Date Noted  . Dizzy 09/07/2018  . Smoking trying to quit  02/16/2018  . PAD (peripheral artery disease) ([place]) 09/18/2017  . History of asthma 05/22/2017  . Tachycardia, paroxysmal ([place]) 02/24/2014  . Vaginal atrophy 02/24/2014  . Chronic pain syndrome 07/16/2013  . Multiple thyroid nodules 06/20/2013  . Abdominal aorta injury 06/20/2013  . HYPERLIPIDEMIA 04/18/2007  . Diabetes mellitus type II, controlled ([place]) 09/07/2006  . TOBACCO DEPENDENCE 09/07/2006  . Essential hypertension, benign 09/07/2006  . Allergic rhinitis 09/07/2006     Objective:  BP 120/62   Pulse 96   Temp 98.5 F (36.9 C) (Oral)   Wt 122 lb 4 oz (55.5 kg)   LMP 08/11/2011   SpO2 97%   BMI 20.98 kg/m   Vitals and nursing note reviewed  General: NAD, pleasant Cardiac: RRR, normal heart sounds, no m/r/g Pulm: normal effort, CTAB Extremities: no edema or cyanosis. WWP. Skin: warm and dry, no rashes noted Neuro: Cranial nerves II through XII grossly intact, no vertical nystagmus noted, mild horizontal nystagmus on exam, alert and oriented, no focal deficits Psych: normal affect, normal thought content  Assessment & Plan:   Diabetes mellitus type II, controlled A1c improved to 7.1 today.  Patient reports that she has lost her meter and has not been able to track it and she is very happy that her A1c has gotten better.  States that she has been trying to eat better.  Patient states that she has been stable on the Jardiance.  She is continuing to have some dizzy spells but might be related more to her blood pressure.  Dizzy Patient with positive orthostatics by HR in office. Instructed to  stand up slowly, stay hydrated, wear compression stockings and ensure she is not going without food.  - Patient will need to return for lab visit for EKG, BMP and CBC given that she continues to be dizzy.   Multiple thyroid nodules Patient needs one-year follow-up ultrasound of thyroid for right lobe nodule previously imaged in 02/2017.  TOBACCO DEPENDENCE Patient given more  Chantix in order to encourage her to stop smoking  Vaginal atrophy Refilled patient's Premarin cream   [place] Belissa Kooy, DO Family Medicine Resident PGY-2

## 2018-09-07 NOTE — Assessment & Plan Note (Signed)
A1c improved to 7.1 today.  Patient reports that she has lost her meter and has not been able to track it and she is very happy that her A1c has gotten better.  States that she has been trying to eat better.  Patient states that she has been stable on the Jardiance.  She is continuing to have some dizzy spells but might be related more to her blood pressure.

## 2018-09-10 NOTE — Assessment & Plan Note (Signed)
Patient needs one-year follow-up ultrasound of thyroid for right lobe nodule previously imaged in 02/2017.

## 2018-09-10 NOTE — Assessment & Plan Note (Signed)
Refilled patient's Premarin cream

## 2018-09-10 NOTE — Assessment & Plan Note (Signed)
Patient given more Chantix in order to encourage her to stop smoking

## 2018-09-12 ENCOUNTER — Encounter: Payer: Self-pay | Admitting: Family Medicine

## 2018-09-12 ENCOUNTER — Telehealth: Payer: Self-pay

## 2018-09-12 DIAGNOSIS — G894 Chronic pain syndrome: Secondary | ICD-10-CM

## 2018-09-12 MED ORDER — OXYCODONE-ACETAMINOPHEN 5-325 MG PO TABS: 1.0000 | ORAL_TABLET | Freq: Two times a day (BID) | ORAL | 0 refills | Status: DC | PRN

## 2018-09-12 NOTE — Telephone Encounter (Signed)
If she is coming in for an appointment, then we will discuss it then, so maybe leave the order for now.

## 2018-09-12 NOTE — Telephone Encounter (Signed)
Pt scheduled with PCP 03/17 for EKG and blood work. I scheduled pt for her one year screening US for 3/16, however pt declined this and wants me to cancel. I informed patient its just a precautionary one year screen, "I do not need that."   Will forward to MD before I cancel.

## 2018-09-12 NOTE — Telephone Encounter (Signed)
-----   Message from Robin Shirley, DO sent at 09/10/2018  1:55 PM EST ----- Called patient and left voicemail instructing her to call and schedule lab visit.  Patient needs EKG, CBC, BMP to further evaluate for her dizziness.  Please refer to encounter from 09/07/2018.  Also after chart review patient also needs to have ultrasound of thyroid scheduled for follow-up of thyroid nodules for her one-year screening.   Please attempt to schedule these for the patient.  Thank you!

## 2018-09-17 ENCOUNTER — Ambulatory Visit (HOSPITAL_COMMUNITY): Payer: BC Managed Care – PPO

## 2018-09-25 ENCOUNTER — Other Ambulatory Visit: Payer: Self-pay

## 2018-09-25 ENCOUNTER — Other Ambulatory Visit: Payer: BC Managed Care – PPO

## 2018-09-25 DIAGNOSIS — E118 Type 2 diabetes mellitus with unspecified complications: Secondary | ICD-10-CM

## 2018-09-26 LAB — CBC
Hematocrit: 38 % (ref 34.0–46.6)
Hemoglobin: 12.2 g/dL (ref 11.1–15.9)
MCH: 29.7 pg (ref 26.6–33.0)
MCHC: 32.1 g/dL (ref 31.5–35.7)
MCV: 93 fL (ref 79–97)
Platelets: 372 10*3/uL (ref 150–450)
RBC: 4.11 x10E6/uL (ref 3.77–5.28)
RDW: 12.5 % (ref 11.7–15.4)
WBC: 10 10*3/uL (ref 3.4–10.8)

## 2018-09-26 LAB — BASIC METABOLIC PANEL
BUN/Creatinine Ratio: 30 — ABNORMAL HIGH (ref 12–28)
BUN: 27 mg/dL (ref 8–27)
CO2: 22 mmol/L (ref 20–29)
Calcium: 10 mg/dL (ref 8.7–10.3)
Chloride: 105 mmol/L (ref 96–106)
Creatinine, Ser: 0.91 mg/dL (ref 0.57–1.00)
GFR calc Af Amer: 78 mL/min/{1.73_m2} (ref >59)
GFR calc non Af Amer: 68 mL/min/{1.73_m2} (ref >59)
Glucose: 121 mg/dL — ABNORMAL HIGH (ref 65–99)
Potassium: 5 mmol/L (ref 3.5–5.2)
Sodium: 143 mmol/L (ref 134–144)

## 2018-10-15 ENCOUNTER — Encounter: Payer: Self-pay | Admitting: Family Medicine

## 2018-10-15 ENCOUNTER — Other Ambulatory Visit: Payer: Self-pay | Admitting: Family Medicine

## 2018-10-15 DIAGNOSIS — G894 Chronic pain syndrome: Secondary | ICD-10-CM

## 2018-10-16 MED ORDER — OXYCODONE-ACETAMINOPHEN 5-325 MG PO TABS: 1.0000 | ORAL_TABLET | Freq: Two times a day (BID) | ORAL | 0 refills | Status: DC | PRN

## 2018-10-16 NOTE — Telephone Encounter (Signed)
I have reviewed [place] and refills are appropriate. Overdose risk score 50.

## 2018-11-01 ENCOUNTER — Encounter: Payer: Self-pay | Admitting: Family Medicine

## 2018-11-01 MED ORDER — VARENICLINE TARTRATE 1 MG PO TABS: 1.0000 mg | ORAL_TABLET | Freq: Two times a day (BID) | ORAL | 1 refills | Status: DC

## 2018-11-19 ENCOUNTER — Encounter: Payer: Self-pay | Admitting: Family Medicine

## 2018-11-19 DIAGNOSIS — G894 Chronic pain syndrome: Secondary | ICD-10-CM

## 2018-11-19 MED ORDER — OXYCODONE-ACETAMINOPHEN 5-325 MG PO TABS: 1.0000 | ORAL_TABLET | Freq: Two times a day (BID) | ORAL | 0 refills | Status: DC | PRN

## 2018-11-30 IMAGING — CT CT HIP*R* W/O CM
2 of 3 series · 17 of 46 positions shown, 19 images · non-contrast
Comparison: Radiographs from 01/25/2017 and CT pelvis from
01/18/2013

CLINICAL DATA: Posterolateral hip pain after fall.

EXAM:
CT OF THE RIGHT HIP WITHOUT CONTRAST
TECHNIQUE: Multidetector CT imaging of the right hip was performed according to
the standard protocol. Multiplanar CT image reconstructions were
also generated.

[Series 3: pelvis 2.0 st · axial · 0.43mm/px · z∈[+815,+945]mm · 14 of 75 slices shown, 16 images]
[im 5/75  soft-tissue]
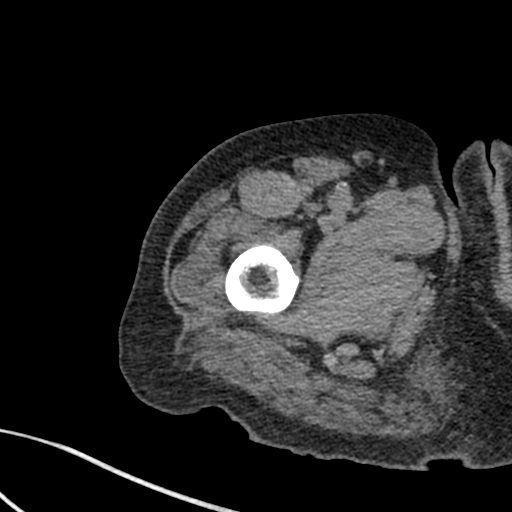
[im 5/75  bone]
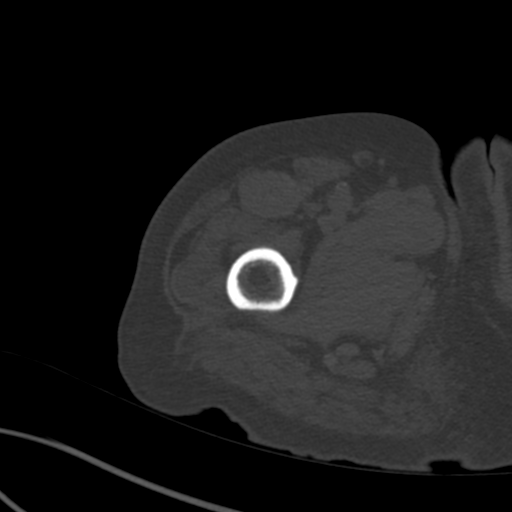
[im 10/75  soft-tissue]
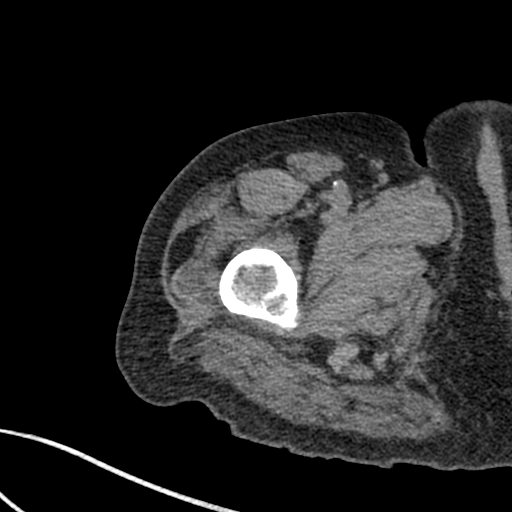
[im 15/75  soft-tissue]
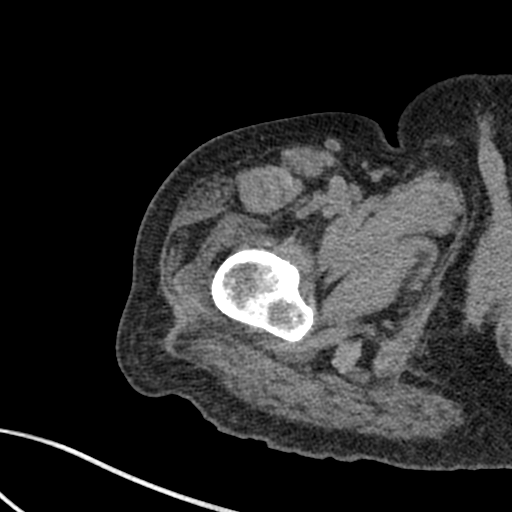
[im 20/75  soft-tissue]
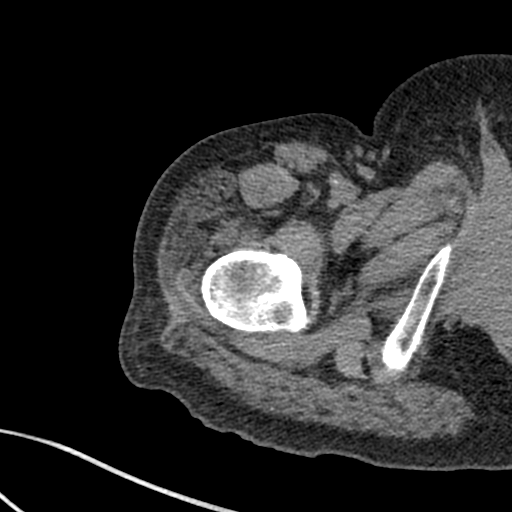
[im 24/75  soft-tissue]
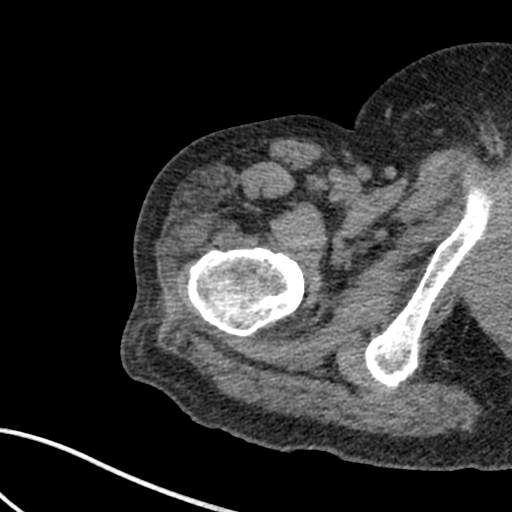
[im 29/75  soft-tissue]
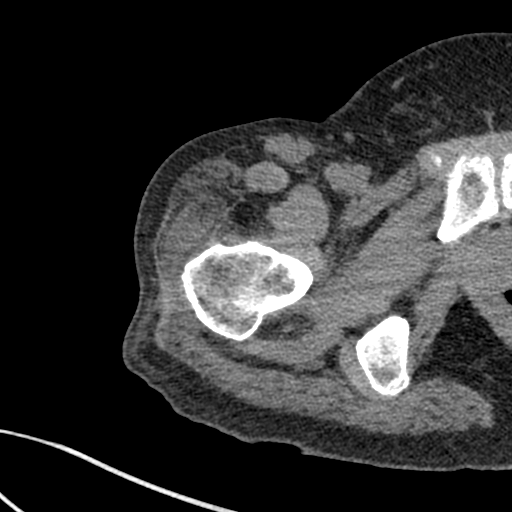
[im 34/75  soft-tissue]
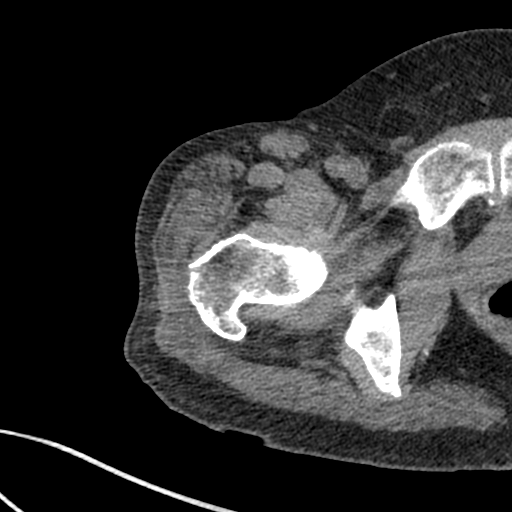
[im 41/75  soft-tissue]
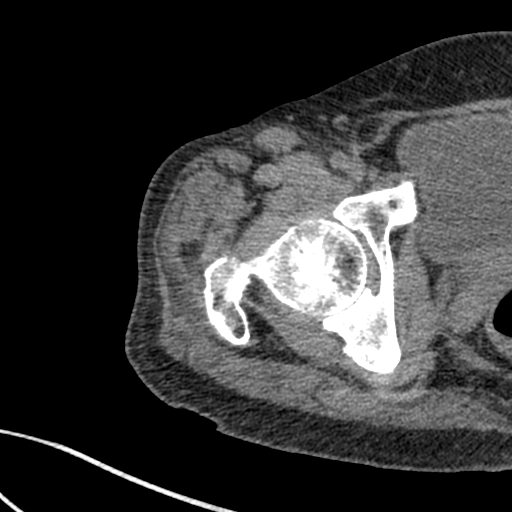
[im 46/75  soft-tissue]
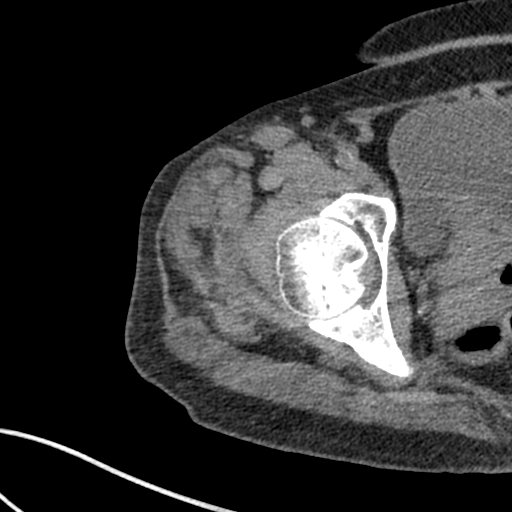
[im 46/75  bone]
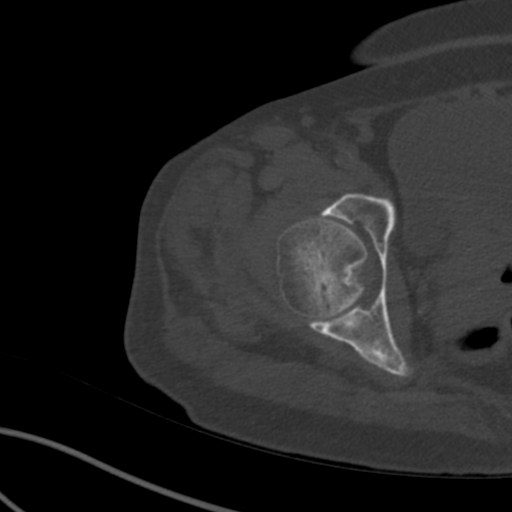
[im 51/75  soft-tissue]
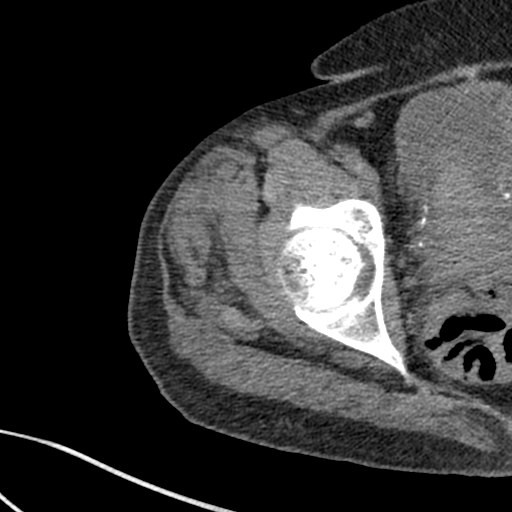
[im 55/75  soft-tissue]
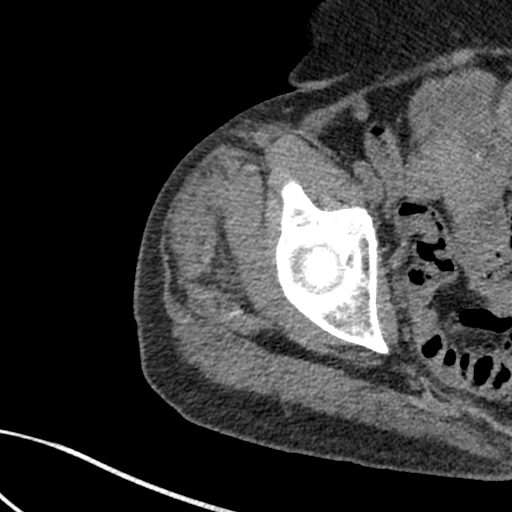
[im 60/75  soft-tissue]
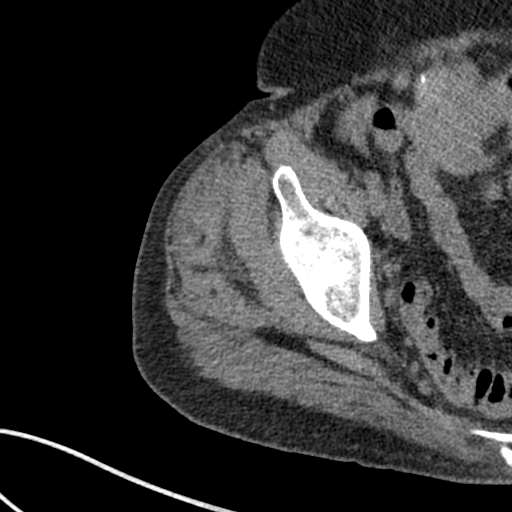
[im 65/75  soft-tissue]
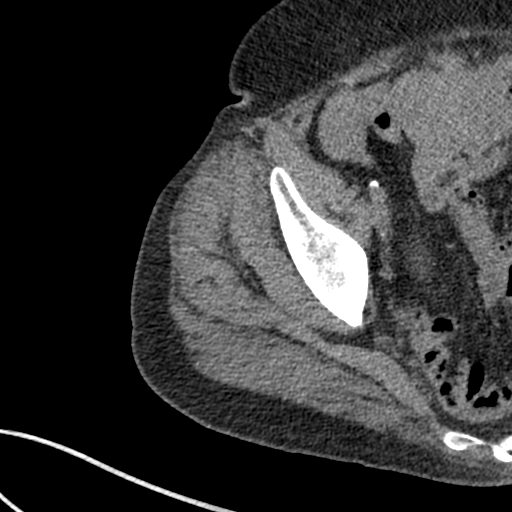
[im 70/75  soft-tissue]
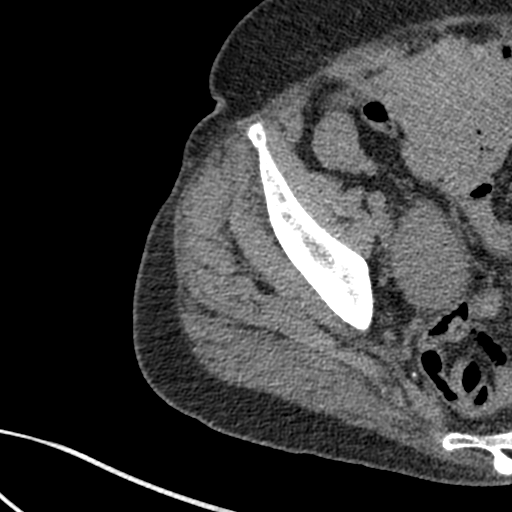

[Series 8: coronal st · coronal · 0.30mm/px · 3 of 95 slices shown]
[im 32/95  soft-tissue]
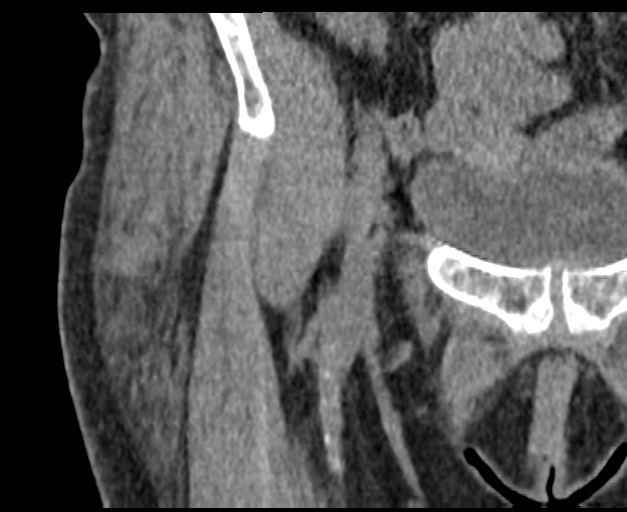
[im 42/95  soft-tissue]
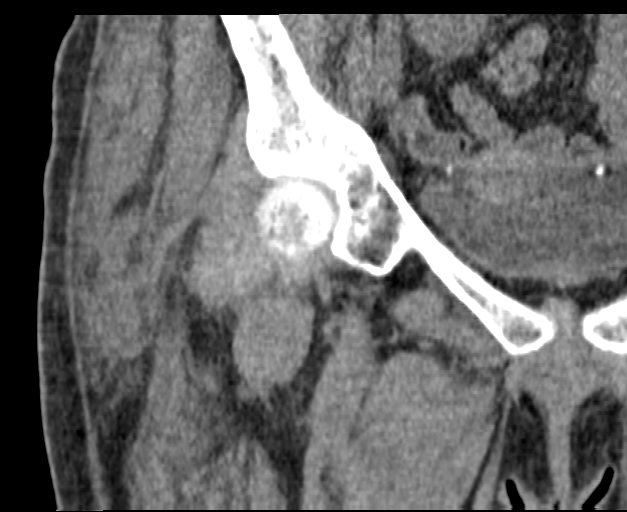
[im 53/95  soft-tissue]
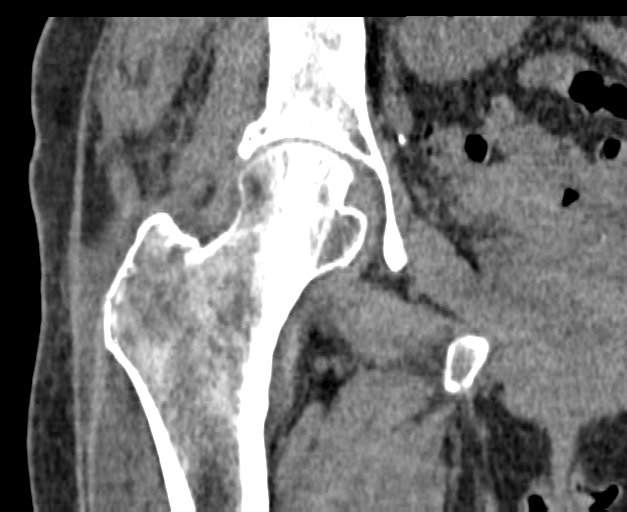

[17 of 46 positions shown; findings below may reference images not displayed]

FINDINGS: Bones/Joint/Cartilage

Acute nondisplaced intertrochanteric fracture of the right hip is a
best visualized along the greater trochanter, but is thought to
extend along the posterior margin of the hip to to the lesser
trochanter. Regional pelvis is intact. Mild spurring of the right
acetabulum.

Ligaments

Suboptimally assessed by CT.

Muscles and Tendons

Unremarkable

Soft tissues

Pelvic right kidney. Unusual contour of the uterus which extends
over the right-sided the urinary bladder, and with suspected uterine
fibroid, similar to the prior 01/18/2013 CT exam.

Iliac vessel atherosclerotic vascular disease.
IMPRESSION: 1. Acute nondisplaced intertrochanteric fracture the right hip.
2. Right pelvic kidney.
3. Uterine fibroids.
4. Atherosclerosis.

## 2018-12-19 ENCOUNTER — Encounter: Payer: Self-pay | Admitting: Family Medicine

## 2018-12-19 DIAGNOSIS — G894 Chronic pain syndrome: Secondary | ICD-10-CM

## 2018-12-20 MED ORDER — OXYCODONE-ACETAMINOPHEN 5-325 MG PO TABS: 1.0000 | ORAL_TABLET | Freq: Two times a day (BID) | ORAL | 0 refills | Status: DC | PRN

## 2018-12-26 ENCOUNTER — Ambulatory Visit: Payer: BC Managed Care – PPO | Admitting: Family Medicine

## 2019-01-08 ENCOUNTER — Encounter: Payer: Self-pay | Admitting: Family Medicine

## 2019-01-09 ENCOUNTER — Ambulatory Visit: Payer: BC Managed Care – PPO | Admitting: Family Medicine

## 2019-01-17 ENCOUNTER — Encounter: Payer: Self-pay | Admitting: Family Medicine

## 2019-01-17 DIAGNOSIS — G894 Chronic pain syndrome: Secondary | ICD-10-CM

## 2019-01-18 MED ORDER — OXYCODONE-ACETAMINOPHEN 5-325 MG PO TABS: 1.0000 | ORAL_TABLET | Freq: Two times a day (BID) | ORAL | 0 refills | Status: DC | PRN

## 2019-02-08 ENCOUNTER — Ambulatory Visit: Payer: BC Managed Care – PPO | Admitting: Family Medicine

## 2019-02-08 ENCOUNTER — Encounter: Payer: Self-pay | Admitting: Family Medicine

## 2019-02-08 ENCOUNTER — Other Ambulatory Visit: Payer: Self-pay

## 2019-02-08 VITALS — BP 124/60 | HR 85 | Ht 64.0 in | Wt 126.4 lb

## 2019-02-08 DIAGNOSIS — Z87891 Personal history of nicotine dependence: Secondary | ICD-10-CM | POA: Diagnosis not present

## 2019-02-08 DIAGNOSIS — G894 Chronic pain syndrome: Secondary | ICD-10-CM

## 2019-02-08 DIAGNOSIS — E042 Nontoxic multinodular goiter: Secondary | ICD-10-CM | POA: Diagnosis not present

## 2019-02-08 DIAGNOSIS — E118 Type 2 diabetes mellitus with unspecified complications: Secondary | ICD-10-CM | POA: Diagnosis not present

## 2019-02-08 LAB — POCT GLYCOSYLATED HEMOGLOBIN (HGB A1C): HbA1c, POC (controlled diabetic range): 6.6 % (ref 0.0–7.0)

## 2019-02-08 NOTE — Patient Instructions (Signed)
Thank you for coming to see me today. It was a pleasure! Today we talked about:   Your diabetes. Continue your current medications. Congratulations on stopping smoking! I have placed an order for you to have an [place] of your thyroid to see if there are any changes. I have also placed for you to have a mammogram.   Please follow-up with me in 3 months or sooner as needed.  If you have any questions or concerns, please do not hesitate to call the office at [phone].  Take Care,   [place] Taletha Twiford, DO
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
## 2019-02-12 NOTE — Assessment & Plan Note (Addendum)
Reviewed PMP aware no red flags noted.  Patient to continue taking Tylenol as well for her pain.  Encouraged to decrease the amount of Percocet and again counseled her on the risk of long-term use of this medication.  She is not willing to decrease the amount of the medication as this has been what has helped her remain mobile.

## 2019-02-12 NOTE — Assessment & Plan Note (Signed)
Patient now agreeable to follow-up of thyroid ultrasound that was recommended 1 year post study on 02/2017.  Ultrasound order placed today.

## 2019-02-12 NOTE — Assessment & Plan Note (Signed)
A1c remains well controlled at 6.6.  Patient only taking metformin.  Continue current medications and management

## 2019-02-19 ENCOUNTER — Encounter: Payer: Self-pay | Admitting: Family Medicine

## 2019-02-19 DIAGNOSIS — G894 Chronic pain syndrome: Secondary | ICD-10-CM

## 2019-02-19 MED ORDER — OXYCODONE-ACETAMINOPHEN 5-325 MG PO TABS: 1.0000 | ORAL_TABLET | Freq: Two times a day (BID) | ORAL | 0 refills | Status: DC | PRN

## 2019-03-12 ENCOUNTER — Other Ambulatory Visit: Payer: Self-pay | Admitting: Family Medicine

## 2019-03-26 ENCOUNTER — Encounter: Payer: Self-pay | Admitting: Family Medicine

## 2019-03-28 ENCOUNTER — Encounter: Payer: Self-pay | Admitting: Family Medicine

## 2019-03-28 DIAGNOSIS — G894 Chronic pain syndrome: Secondary | ICD-10-CM

## 2019-03-28 MED ORDER — OXYCODONE-ACETAMINOPHEN 5-325 MG PO TABS: 1.0000 | ORAL_TABLET | Freq: Two times a day (BID) | ORAL | 0 refills | Status: DC | PRN

## 2019-03-30 ENCOUNTER — Encounter: Payer: Self-pay | Admitting: Family Medicine

## 2019-04-01 ENCOUNTER — Telehealth: Payer: Self-pay

## 2019-04-01 ENCOUNTER — Encounter: Payer: Self-pay | Admitting: Family Medicine

## 2019-04-01 NOTE — Telephone Encounter (Signed)
[place]. Rep stated that Pain med was filled on 9/17 and that it is ready for pick up. Rep stated that pt is not signed up to get phone calls when Rx are ready for pick up. That is why pt never received a phone call stating that it was ready.  I attempted to call pt to let her know. But there was no answer. I left a voice message informing pt of what the pharm rep stated to me. Salvatore Marvel, CMA

## 2019-05-03 ENCOUNTER — Encounter: Payer: Self-pay | Admitting: Family Medicine

## 2019-05-03 DIAGNOSIS — G894 Chronic pain syndrome: Secondary | ICD-10-CM

## 2019-05-03 MED ORDER — OXYCODONE-ACETAMINOPHEN 5-325 MG PO TABS: 1.0000 | ORAL_TABLET | Freq: Two times a day (BID) | ORAL | 0 refills | Status: DC | PRN

## 2019-06-04 ENCOUNTER — Encounter: Payer: Self-pay | Admitting: Family Medicine

## 2019-06-04 DIAGNOSIS — G894 Chronic pain syndrome: Secondary | ICD-10-CM

## 2019-06-05 MED ORDER — OXYCODONE-ACETAMINOPHEN 5-325 MG PO TABS: 1.0000 | ORAL_TABLET | Freq: Two times a day (BID) | ORAL | 0 refills | Status: DC | PRN

## 2019-06-14 ENCOUNTER — Other Ambulatory Visit: Payer: Self-pay | Admitting: Family Medicine

## 2019-06-14 DIAGNOSIS — I10 Essential (primary) hypertension: Secondary | ICD-10-CM

## 2019-06-14 DIAGNOSIS — E782 Mixed hyperlipidemia: Secondary | ICD-10-CM

## 2019-07-04 ENCOUNTER — Encounter: Payer: Self-pay | Admitting: Family Medicine

## 2019-07-08 ENCOUNTER — Other Ambulatory Visit: Payer: Self-pay | Admitting: Family Medicine

## 2019-07-08 DIAGNOSIS — G894 Chronic pain syndrome: Secondary | ICD-10-CM

## 2019-07-09 ENCOUNTER — Other Ambulatory Visit: Payer: Self-pay | Admitting: Family Medicine

## 2019-07-09 DIAGNOSIS — G894 Chronic pain syndrome: Secondary | ICD-10-CM

## 2019-07-09 MED ORDER — OXYCODONE-ACETAMINOPHEN 5-325 MG PO TABS: 1.0000 | ORAL_TABLET | Freq: Two times a day (BID) | ORAL | 0 refills | Status: DC | PRN

## 2019-08-14 ENCOUNTER — Ambulatory Visit (INDEPENDENT_AMBULATORY_CARE_PROVIDER_SITE_OTHER): Payer: Self-pay | Admitting: Family Medicine

## 2019-08-14 ENCOUNTER — Other Ambulatory Visit: Payer: Self-pay

## 2019-08-14 ENCOUNTER — Encounter: Payer: Self-pay | Admitting: Family Medicine

## 2019-08-14 VITALS — BP 130/58 | HR 76 | Wt 124.0 lb

## 2019-08-14 DIAGNOSIS — G894 Chronic pain syndrome: Secondary | ICD-10-CM

## 2019-08-14 DIAGNOSIS — I10 Essential (primary) hypertension: Secondary | ICD-10-CM

## 2019-08-14 DIAGNOSIS — Z23 Encounter for immunization: Secondary | ICD-10-CM

## 2019-08-14 DIAGNOSIS — E782 Mixed hyperlipidemia: Secondary | ICD-10-CM

## 2019-08-14 DIAGNOSIS — E118 Type 2 diabetes mellitus with unspecified complications: Secondary | ICD-10-CM

## 2019-08-14 DIAGNOSIS — Z1231 Encounter for screening mammogram for malignant neoplasm of breast: Secondary | ICD-10-CM

## 2019-08-14 LAB — POCT GLYCOSYLATED HEMOGLOBIN (HGB A1C): HbA1c, POC (controlled diabetic range): 7.2 % — AB (ref 0.0–7.0)

## 2019-08-14 MED ORDER — METFORMIN HCL 1000 MG PO TABS: 1000.0000 mg | ORAL_TABLET | Freq: Two times a day (BID) | ORAL | 0 refills | Status: DC

## 2019-08-14 MED ORDER — DICLOFENAC SODIUM 1 % EX GEL: 4.0000 g | Freq: Four times a day (QID) | CUTANEOUS | 2 refills | Status: DC

## 2019-08-14 MED ORDER — ASPIRIN 325 MG PO TABS: 325.0000 mg | ORAL_TABLET | Freq: Every day | ORAL | 3 refills | Status: DC

## 2019-08-14 MED ORDER — LISINOPRIL 40 MG PO TABS: 40.0000 mg | ORAL_TABLET | Freq: Every day | ORAL | 3 refills | Status: DC

## 2019-08-14 MED ORDER — FLUTICASONE PROPIONATE 50 MCG/ACT NA SUSP: 2.0000 | Freq: Every day | NASAL | 6 refills | Status: DC

## 2019-08-14 MED ORDER — JARDIANCE 10 MG PO TABS: 10.0000 mg | ORAL_TABLET | Freq: Every day | ORAL | 0 refills | Status: DC

## 2019-08-14 MED ORDER — OXYCODONE-ACETAMINOPHEN 5-325 MG PO TABS: 1.0000 | ORAL_TABLET | Freq: Two times a day (BID) | ORAL | 0 refills | Status: DC | PRN

## 2019-08-14 MED ORDER — HYDROCHLOROTHIAZIDE 25 MG PO TABS: 25.0000 mg | ORAL_TABLET | Freq: Every day | ORAL | 0 refills | Status: DC

## 2019-08-14 MED ORDER — ATORVASTATIN CALCIUM 40 MG PO TABS: 40.0000 mg | ORAL_TABLET | Freq: Every day | ORAL | 0 refills | Status: DC

## 2019-08-14 NOTE — Patient Instructions (Signed)
Thank you for coming to see me today. It was a pleasure! Today we talked about:   I have refilled your medications. Please try the voltaren gel for your leg pain. It is available over the counter.   Please follow-up with me in 3 months or sooner as needed.  If you have any questions or concerns, please do not hesitate to call the office at [phone].  Take Care,   [place] Basha Krygier, DO

## 2019-08-14 NOTE — Progress Notes (Deleted)
   CHIEF COMPLAINT / HPI:  ***  PERTINENT  PMH / PSH: ***   OBJECTIVE: BP (!) 130/58   Pulse 76   Wt 124 lb (56.2 kg)   LMP 08/11/2011   SpO2 99%   BMI 21.28 kg/m   ***  ASSESSMENT / PLAN:  No problem-specific Assessment & Plan notes found for this encounter.     [place] Tena Linebaugh, [place]
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
## 2019-08-15 LAB — LIPID PANEL
Chol/HDL Ratio: 2.2 ratio (ref 0.0–4.4)
Cholesterol, Total: 120 mg/dL (ref 100–199)
HDL: 54 mg/dL (ref >39)
LDL Chol Calc (NIH): 56 mg/dL (ref 0–99)
Triglycerides: 37 mg/dL (ref 0–149)
VLDL Cholesterol Cal: 10 mg/dL (ref 5–40)

## 2019-08-15 LAB — BASIC METABOLIC PANEL
BUN/Creatinine Ratio: 18 (ref 12–28)
BUN: 17 mg/dL (ref 8–27)
CO2: 26 mmol/L (ref 20–29)
Calcium: 9.7 mg/dL (ref 8.7–10.3)
Chloride: 104 mmol/L (ref 96–106)
Creatinine, Ser: 0.93 mg/dL (ref 0.57–1.00)
GFR calc Af Amer: 76 mL/min/{1.73_m2} (ref >59)
GFR calc non Af Amer: 66 mL/min/{1.73_m2} (ref >59)
Glucose: 128 mg/dL — ABNORMAL HIGH (ref 65–99)
Potassium: 4.3 mmol/L (ref 3.5–5.2)
Sodium: 142 mmol/L (ref 134–144)

## 2019-08-16 NOTE — Assessment & Plan Note (Signed)
Continue with atorvastatin 40 mg.  Patient's lipid panel with LDL less than 70 and within goal.

## 2019-08-16 NOTE — Assessment & Plan Note (Signed)
Patient remained stable on 60 tabs of Percocet 5mg s monthly.  No red flags on narcotic database.  Counseled patient on risk versus benefit of continuing this medication.  Continue to monitor.

## 2019-08-16 NOTE — Assessment & Plan Note (Signed)
BMP WNL.  Continue current management with HCTZ 25.  Patient's blood pressure within goal today

## 2019-08-16 NOTE — Assessment & Plan Note (Signed)
Patient's A1c has increased to 7.2 given her recent increase in sugar in her diet due to stress from taking care of her husband.  We will continue with Metformin and Jardiance as before.  Patient aware of lifestyle modifications that she needs in order to improve her A1c.  Diabetic foot exam performed today.

## 2019-08-20 ENCOUNTER — Ambulatory Visit: Payer: Self-pay | Admitting: Licensed Clinical Social Worker

## 2019-08-20 NOTE — Chronic Care Management (AMB) (Signed)
   Clinical Social Work  Care Management referral   08/20/2019 Name: Luul Torelli MRN: [id number] DOB: 08/11/1955  Versia Delaguila is a 64 y.o. year old female who is a primary care patient of Shirley, [place], DO . LCSW was consulted by PCP for assistance with Medicaid resources.  LCSW reached out to [facility] today by phone to introduce self, assess needs and provide one time brief intervention.  Assessment: Patient is experiencing difficulty with understanding why Medicare is taking money out of her check each month and she does not receive Medicare. Patient insurance has also changed and she no longer has BCBS but [place], did not have an insurance card.  Reports this have been resolved.  Recommendation: Patient may benefit from, and is in agreement to call the [facility] for clarification and to answer her questions Intervention:SDOH (Social Determinants of Health) screening performed : challenges identified: None Provided patient with information and phone number to [employer].  Other interventions include but not limited to task centered and   Solution-Focused Strategies.   Review of patient status, including review of consultants reports, relevant laboratory and other test results, and collaboration with appropriate care team members and the patient's provider was performed as part of comprehensive patient evaluation and provision of care management services.   Plan:   1. Patient will call Social Security Administration 2. No further services needed by LCSW  Casimer Lanius, [place] / [place]   [phone] 10:54 AM

## 2019-09-16 ENCOUNTER — Other Ambulatory Visit: Payer: Self-pay | Admitting: Family Medicine

## 2019-09-16 DIAGNOSIS — G894 Chronic pain syndrome: Secondary | ICD-10-CM

## 2019-09-17 MED ORDER — OXYCODONE-ACETAMINOPHEN 5-325 MG PO TABS: 1.0000 | ORAL_TABLET | Freq: Two times a day (BID) | ORAL | 0 refills | Status: DC | PRN

## 2019-10-18 ENCOUNTER — Other Ambulatory Visit: Payer: Self-pay | Admitting: Family Medicine

## 2019-10-18 DIAGNOSIS — G894 Chronic pain syndrome: Secondary | ICD-10-CM

## 2019-10-18 MED ORDER — OXYCODONE-ACETAMINOPHEN 5-325 MG PO TABS: 1.0000 | ORAL_TABLET | Freq: Two times a day (BID) | ORAL | 0 refills | Status: DC | PRN

## 2019-11-21 ENCOUNTER — Other Ambulatory Visit: Payer: Self-pay | Admitting: Family Medicine

## 2019-11-21 DIAGNOSIS — G894 Chronic pain syndrome: Secondary | ICD-10-CM

## 2019-11-21 MED ORDER — OXYCODONE-ACETAMINOPHEN 5-325 MG PO TABS: 1.0000 | ORAL_TABLET | Freq: Two times a day (BID) | ORAL | 0 refills | Status: DC | PRN

## 2019-12-04 ENCOUNTER — Other Ambulatory Visit: Payer: Self-pay | Admitting: Family Medicine

## 2019-12-04 DIAGNOSIS — E782 Mixed hyperlipidemia: Secondary | ICD-10-CM

## 2019-12-13 ENCOUNTER — Other Ambulatory Visit: Payer: Self-pay | Admitting: Family Medicine

## 2019-12-20 ENCOUNTER — Other Ambulatory Visit: Payer: Self-pay | Admitting: Family Medicine

## 2019-12-25 ENCOUNTER — Other Ambulatory Visit: Payer: Self-pay | Admitting: Family Medicine

## 2019-12-25 DIAGNOSIS — I10 Essential (primary) hypertension: Secondary | ICD-10-CM

## 2019-12-25 DIAGNOSIS — G894 Chronic pain syndrome: Secondary | ICD-10-CM

## 2019-12-25 MED ORDER — OXYCODONE-ACETAMINOPHEN 5-325 MG PO TABS: 1.0000 | ORAL_TABLET | Freq: Two times a day (BID) | ORAL | 0 refills | Status: DC | PRN

## 2019-12-25 MED ORDER — HYDROCHLOROTHIAZIDE 25 MG PO TABS: 25.0000 mg | ORAL_TABLET | Freq: Every day | ORAL | 0 refills | Status: DC

## 2019-12-25 NOTE — Telephone Encounter (Signed)
PDMP reviewed and appropriate

## 2020-01-30 ENCOUNTER — Ambulatory Visit: Payer: Self-pay | Admitting: Family Medicine
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
## 2020-02-03 ENCOUNTER — Other Ambulatory Visit: Payer: Self-pay | Admitting: Family Medicine

## 2020-02-03 DIAGNOSIS — G894 Chronic pain syndrome: Secondary | ICD-10-CM

## 2020-02-03 MED ORDER — OXYCODONE-ACETAMINOPHEN 5-325 MG PO TABS: 1.0000 | ORAL_TABLET | Freq: Two times a day (BID) | ORAL | 0 refills | Status: DC | PRN

## 2020-02-12 ENCOUNTER — Ambulatory Visit: Payer: Self-pay | Admitting: Family Medicine

## 2020-02-12 NOTE — Progress Notes (Deleted)
   Subjective:   Patient ID: Robin [name]    DOB: [birth year], 64 y.o. female   MRN: [id number]  Robin [name] is a 64 y.o. female with a history of PAD, HTN, paroxysmal tachycardia, ulcer lesion of abdominal aorta, allergic rhinitis, T2DM, multiple thyroid nodules, vaginal atrophy, HLD, h/o tabacco abuse, chronic pain, history of asthma here for check up.  Diabetes: Last three A1C's below. Currently on Metformin 1000mg  BID, Jardiance 10mg  QD. Endorses compliance. Notes CBGs range ***. Denies any hypoglycemia. Denies any polyuria, polydipsia, polyphagia. Due for Diabetic eye exam.  Lab Results  Component Value Date   HGBA1C 7.2 (A) 08/14/2019   HGBA1C 6.6 02/08/2019   HGBA1C 7.1 (A) 09/07/2018    HTN:    today. Currently on HCTZ 25mg  QD, and Lisinopril 40mg  qHS. Endorses compliance. Current everyday smoker. Last BMP with stable kidney function and electrolytes. Denies any chest pain, SOB, vision changes, or headaches.   Tobacco Abuse: Currently Smokes *** pack/day x *** years (*** pack year history). Does ***not qualify for lung cancer screen at this time. Patient was counseled on the risks of tobacco use and cessation strongly encouraged.  Recommendations; Annual lung cancer screening with low-dose CT to adults aged 71 to 54 years, with a 20 pack-year smoking history, and who currently smoke or have quit within the last 15 years (B recommendation). Screening should be discontinued once a person has not smoked for 15 years, develops a substantially limited life-expectancy, or no longer desires to continue screening.  Health Maintenance: Due for mammogram, diabetic eye exam, and COVID vaccine  Review of Systems:  Per HPI.   Objective:   LMP 08/11/2011  Vitals and nursing note reviewed.  General: well nourished, well developed, in no acute distress with non-toxic appearance HEENT: normocephalic, atraumatic, moist mucous membranes Neck: supple, non-tender  without lymphadenopathy CV: regular rate and rhythm without murmurs, rubs, or gallops, no lower extremity edema Lungs: clear to auscultation bilaterally with normal work of breathing Abdomen: soft, non-tender, non-distended, no masses or organomegaly palpable, normoactive bowel sounds Skin: warm, dry, no rashes or lesions Extremities: warm and well perfused, normal tone MSK: ROM grossly intact, strength intact, gait normal Neuro: Alert and oriented, speech normal  Assessment & Plan:   No problem-specific Assessment & Plan notes found for this encounter.  No orders of the defined types were placed in this encounter.  No orders of the defined types were placed in this encounter.   Mina Marble, DO PGY-3, [facility] 02/12/2020 7:41 AM

## 2020-02-12 NOTE — Assessment & Plan Note (Deleted)
She also follows with [facility] for PAD. She is s/p right iliac artery stent placement on 09/2017. She had repeat iliac duplex and LE arterial duplex dopplers in 11/2019 that showed stable PAD with patent iliac artery stent. - Continue ASA, Statin, BP and diabetes control - Follow up with [facility] in 1 year for iliac duplex and LE arterial duplex

## 2020-02-12 NOTE — Assessment & Plan Note (Deleted)
Followed by vascular surgery at [facility].Currently on statin, ASA, ACE-I. Last CT scan 09/2017. Plan based on 11/2019 appt: Follow up in 1 year for repeat CTA abdomen/pelvis to evalaute aortic

## 2020-03-10 ENCOUNTER — Other Ambulatory Visit: Payer: Self-pay | Admitting: Family Medicine

## 2020-03-10 DIAGNOSIS — G894 Chronic pain syndrome: Secondary | ICD-10-CM

## 2020-03-10 MED ORDER — OXYCODONE-ACETAMINOPHEN 5-325 MG PO TABS: 1.0000 | ORAL_TABLET | Freq: Two times a day (BID) | ORAL | 0 refills | Status: DC | PRN

## 2020-03-11 ENCOUNTER — Encounter: Payer: Self-pay | Admitting: Family Medicine
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
## 2020-03-17 ENCOUNTER — Ambulatory Visit: Payer: Medicare PPO | Admitting: Family Medicine

## 2020-03-17 ENCOUNTER — Other Ambulatory Visit: Payer: Self-pay

## 2020-03-24 ENCOUNTER — Other Ambulatory Visit: Payer: Self-pay

## 2020-03-24 ENCOUNTER — Encounter: Payer: Self-pay | Admitting: Family Medicine

## 2020-03-24 ENCOUNTER — Ambulatory Visit: Payer: Medicare PPO | Admitting: Family Medicine

## 2020-03-24 VITALS — BP 128/58 | HR 77 | Wt 119.0 lb

## 2020-03-24 DIAGNOSIS — Z23 Encounter for immunization: Secondary | ICD-10-CM | POA: Diagnosis not present

## 2020-03-24 DIAGNOSIS — E118 Type 2 diabetes mellitus with unspecified complications: Secondary | ICD-10-CM | POA: Diagnosis not present

## 2020-03-24 DIAGNOSIS — L818 Other specified disorders of pigmentation: Secondary | ICD-10-CM

## 2020-03-24 LAB — POCT GLYCOSYLATED HEMOGLOBIN (HGB A1C): HbA1c, POC (controlled diabetic range): 6.7 % (ref 0.0–7.0)

## 2020-03-24 NOTE — Progress Notes (Signed)
    SUBJECTIVE:   CHIEF COMPLAINT / HPI:   [name] is a 64 yo female here for an A1c check and to discuss white spots on her body.   DM2 :Patient's wants to check her Hgb A1C today, and her last check was on 2/3 which was 7.2. She states that her diet hasn't been the best recently. She denies syncope, diaphoresis, polyuria, polydipsia. She does endorse some dizziness occasionally when moving her head in different directions or when leaning forward. She states she believes this started after beginning the Jardiance and thinks it may be due to the medication.  White spots: Patient states she has had diffuse small white spots for years but have increased in number recently. Denies any pain or itching and does not bother her. Does endorse being in the sun frequently working in her garden.  PERTINENT  PMH / PSH:  DM2, HTN, eczema, PAD   OBJECTIVE:   BP (!) 128/58   Pulse 77   Wt 119 lb (54 kg)   LMP 08/11/2011   SpO2 98%   BMI 20.43 kg/m    Physical Exam Constitutional:      General: She is not in acute distress.    Appearance: She is not ill-appearing.  Cardiovascular:     Rate and Rhythm: Normal rate and regular rhythm.     Pulses: Normal pulses.     Heart sounds: Normal heart sounds.  Pulmonary:     Effort: Pulmonary effort is normal.     Breath sounds: Normal breath sounds. No wheezing, rhonchi or rales.  Abdominal:     General: There is no distension.     Palpations: Abdomen is soft.  Skin:    General: Skin is warm and dry.    ASSESSMENT/PLAN:   No problem-specific Assessment & Plan notes found for this encounter.   DM2 Patient's current Hgb A1C in the clinic is 6.7 (down from 7.2 on 2/3). She will continue to watch her diet. - Con't Metformin 1000 BID - Patient would like to con't Jardiance 10mg  as well and if she continues to have dizziness we can change the medication   Idiopathic guttate hypomelanosis  Patient presents with small white well  circumscribed smooth macules mainly on her legs bilaterally  - No treatment necessary   Health Maintenance - Due for ophthalmology exam. Patient states she has someone she goes to and does not need a referral. Pt to schedule appt.  - received flu vaccine in clinic - will think about COVID vaccine    Follow up in 3 months to check on diabetes  [place]

## 2020-03-24 NOTE — Patient Instructions (Addendum)
It was great seeing you today! Today we checked your A1C which is 6.7 and an improvement from your last check. Continue making good nutrition choices. You also received your flu vaccine. We highly recommend that you consider getting your COVID vaccine as well. You can call and make an appointment to get the vaccine when you are ready.  We looked at the white spots and it is a benign condition that is likely caused from aging and the sun called idiopathic guttate hypomelanosis.   Please check-out at the front desk before leaving the clinic. I'd like to see you back in 3 months to check  but if you need to be seen earlier than that for any new issues we're happy to fit you in, just give [place] a call!  Feel free to call with any questions or concerns at any time, at [phone].   Take care,  Dr. Yehuda [facility]

## 2020-03-26 MED ORDER — EMPAGLIFLOZIN 10 MG PO TABS: 10.0000 mg | ORAL_TABLET | Freq: Every day | ORAL | 0 refills | Status: DC

## 2020-03-26 NOTE — Telephone Encounter (Signed)
Please have patient schedule follow up appointment at earliest convenience

## 2020-03-27 NOTE — Telephone Encounter (Signed)
Called patient to make appointment per PCP. Patient states that she is not sure as to why she needs to come in again.  Patient states that she was just here on 03/24/2020 with Dr. Arby Barrette.  She received her A1C and a flu shot.  Ozella Almond, [place]

## 2020-03-27 NOTE — Telephone Encounter (Signed)
Called patient to apologize for the mistake. I do see she was seen by Dr. Arby Barrette on 03/2020. Patient to follow up in 3-6 months for follow up, or sooner if needed.

## 2020-04-08 ENCOUNTER — Other Ambulatory Visit: Payer: Self-pay

## 2020-04-08 DIAGNOSIS — I10 Essential (primary) hypertension: Secondary | ICD-10-CM

## 2020-04-09 MED ORDER — HYDROCHLOROTHIAZIDE 25 MG PO TABS: 25.0000 mg | ORAL_TABLET | Freq: Every day | ORAL | 2 refills | Status: DC

## 2020-04-09 MED ORDER — METFORMIN HCL 1000 MG PO TABS: 1000.0000 mg | ORAL_TABLET | Freq: Two times a day (BID) | ORAL | 2 refills | Status: DC

## 2020-04-13 ENCOUNTER — Other Ambulatory Visit: Payer: Self-pay | Admitting: Family Medicine

## 2020-04-13 DIAGNOSIS — G894 Chronic pain syndrome: Secondary | ICD-10-CM

## 2020-04-13 MED ORDER — OXYCODONE-ACETAMINOPHEN 5-325 MG PO TABS: 1.0000 | ORAL_TABLET | Freq: Two times a day (BID) | ORAL | 0 refills | Status: DC | PRN

## 2020-04-16 ENCOUNTER — Other Ambulatory Visit: Payer: Self-pay

## 2020-04-16 ENCOUNTER — Other Ambulatory Visit: Payer: Self-pay | Admitting: Family Medicine

## 2020-04-16 DIAGNOSIS — E782 Mixed hyperlipidemia: Secondary | ICD-10-CM

## 2020-04-16 DIAGNOSIS — G894 Chronic pain syndrome: Secondary | ICD-10-CM

## 2020-04-16 MED ORDER — ATORVASTATIN CALCIUM 40 MG PO TABS: 40.0000 mg | ORAL_TABLET | Freq: Every day | ORAL | 2 refills | Status: DC

## 2020-04-19 NOTE — Telephone Encounter (Signed)
Please inform patient prescription has already been called in on 10/4 and appears to have been picked up per pharmacy records.

## 2020-05-16 ENCOUNTER — Other Ambulatory Visit: Payer: Self-pay | Admitting: Family Medicine

## 2020-05-16 DIAGNOSIS — G894 Chronic pain syndrome: Secondary | ICD-10-CM

## 2020-05-18 MED ORDER — OXYCODONE-ACETAMINOPHEN 5-325 MG PO TABS: 1.0000 | ORAL_TABLET | Freq: Two times a day (BID) | ORAL | 0 refills | Status: DC | PRN

## 2020-06-12 ENCOUNTER — Encounter (HOSPITAL_COMMUNITY): Payer: Self-pay | Admitting: *Deleted

## 2020-06-12 ENCOUNTER — Emergency Department (HOSPITAL_COMMUNITY)
Admission: EM | Admit: 2020-06-12 | Discharge: 2020-06-13 | Disposition: A | Discharge status: Home / Self Care | Payer: Medicare PPO | Attending: Emergency Medicine | Admitting: Emergency Medicine

## 2020-06-12 ENCOUNTER — Other Ambulatory Visit: Payer: Self-pay

## 2020-06-12 ENCOUNTER — Ambulatory Visit (HOSPITAL_COMMUNITY)
Admission: EM | Admit: 2020-06-12 | Discharge: 2020-06-12 | Disposition: A | Discharge status: Home / Self Care | Payer: Medicare PPO

## 2020-06-12 ENCOUNTER — Emergency Department (HOSPITAL_COMMUNITY): Payer: Medicare PPO

## 2020-06-12 ENCOUNTER — Encounter (HOSPITAL_COMMUNITY): Payer: Self-pay | Admitting: Emergency Medicine

## 2020-06-12 DIAGNOSIS — Z7984 Long term (current) use of oral hypoglycemic drugs: Secondary | ICD-10-CM | POA: Insufficient documentation

## 2020-06-12 DIAGNOSIS — W208XXA Other cause of strike by thrown, projected or falling object, initial encounter: Secondary | ICD-10-CM | POA: Diagnosis not present

## 2020-06-12 DIAGNOSIS — Z87891 Personal history of nicotine dependence: Secondary | ICD-10-CM | POA: Diagnosis not present

## 2020-06-12 DIAGNOSIS — I10 Essential (primary) hypertension: Secondary | ICD-10-CM | POA: Diagnosis not present

## 2020-06-12 DIAGNOSIS — Z7982 Long term (current) use of aspirin: Secondary | ICD-10-CM | POA: Insufficient documentation

## 2020-06-12 DIAGNOSIS — S61312A Laceration without foreign body of right middle finger with damage to nail, initial encounter: Secondary | ICD-10-CM | POA: Insufficient documentation

## 2020-06-12 DIAGNOSIS — Z794 Long term (current) use of insulin: Secondary | ICD-10-CM | POA: Diagnosis not present

## 2020-06-12 DIAGNOSIS — J4520 Mild intermittent asthma, uncomplicated: Secondary | ICD-10-CM | POA: Diagnosis not present

## 2020-06-12 DIAGNOSIS — E119 Type 2 diabetes mellitus without complications: Secondary | ICD-10-CM | POA: Diagnosis not present

## 2020-06-12 DIAGNOSIS — M7989 Other specified soft tissue disorders: Secondary | ICD-10-CM | POA: Diagnosis not present

## 2020-06-12 DIAGNOSIS — S62632B Displaced fracture of distal phalanx of right middle finger, initial encounter for open fracture: Secondary | ICD-10-CM | POA: Diagnosis not present

## 2020-06-12 DIAGNOSIS — Y92009 Unspecified place in unspecified non-institutional (private) residence as the place of occurrence of the external cause: Secondary | ICD-10-CM | POA: Insufficient documentation

## 2020-06-12 DIAGNOSIS — S6991XA Unspecified injury of right wrist, hand and finger(s), initial encounter: Secondary | ICD-10-CM | POA: Diagnosis present

## 2020-06-12 DIAGNOSIS — Z79899 Other long term (current) drug therapy: Secondary | ICD-10-CM | POA: Insufficient documentation

## 2020-06-12 DIAGNOSIS — S62632A Displaced fracture of distal phalanx of right middle finger, initial encounter for closed fracture: Secondary | ICD-10-CM | POA: Diagnosis not present

## 2020-06-12 DIAGNOSIS — S61212A Laceration without foreign body of right middle finger without damage to nail, initial encounter: Secondary | ICD-10-CM

## 2020-06-12 NOTE — ED Triage Notes (Signed)
Pt says a cement piece fell on her finger. Right hand middle distal finger pain, with fatty tissue exposure. Last tetanus 2014/2015.
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
## 2020-06-13 DIAGNOSIS — S61312A Laceration without foreign body of right middle finger with damage to nail, initial encounter: Secondary | ICD-10-CM | POA: Diagnosis not present

## 2020-06-13 DIAGNOSIS — S62632B Displaced fracture of distal phalanx of right middle finger, initial encounter for open fracture: Secondary | ICD-10-CM | POA: Diagnosis not present

## 2020-06-13 MED ORDER — CEPHALEXIN 250 MG PO CAPS
500.0000 mg | ORAL_CAPSULE | Freq: Once | ORAL | Status: AC
  Administered 2020-06-13: 500 mg via ORAL
  Filled 2020-06-13: qty 2

## 2020-06-13 MED ORDER — BUPIVACAINE HCL (PF) 0.5 % IJ SOLN
10.0000 mL | Freq: Once | INTRAMUSCULAR | Status: AC
  Administered 2020-06-13: 10 mL
  Filled 2020-06-13: qty 10

## 2020-06-13 MED ORDER — CEPHALEXIN 500 MG PO CAPS: 500.0000 mg | ORAL_CAPSULE | Freq: Three times a day (TID) | ORAL | 0 refills | Status: DC

## 2020-06-13 NOTE — ED Notes (Signed)
Patient verbalizes understanding of discharge instructions. Opportunity for questioning and answers were provided. Arm band removed by staff, patient discharged from ED. 

## 2020-06-13 NOTE — Discharge Instructions (Addendum)
Wear the splint until you see the hand specialist.  Take ibuprofen as needed for pain. For severe pain, you may take your oxycodone-acetaminophen for severe pain.  Because there is a laceration, this is considered an open fracture. That means that there is a risk of infection getting into the bone. You have been given a prescription for an antibiotic to try to keep that from happening.
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
## 2020-06-13 NOTE — ED Notes (Signed)
Marcaine at bedside, on suture cart

## 2020-06-13 NOTE — ED Notes (Addendum)
Provider at bedside, injecting marcaine & suturing pt's finger

## 2020-06-13 NOTE — ED Provider Notes (Signed)
 ..  Laceration Repair  Date/Time: 06/13/2020 7:09 AM Performed by: Kinnie Feil, PA-C Authorized by: Kinnie Feil, PA-C   Consent:    Consent obtained:  Verbal   Consent given by:  Patient   Risks discussed:  Infection, need for additional repair, pain, poor cosmetic result and poor wound healing   Alternatives discussed:  No treatment and referral Universal protocol:    Procedure explained and questions answered to patient or proxy's satisfaction: yes     Relevant documents present and verified: yes     Test results available and properly labeled: yes     Imaging studies available: yes     Required blood products, implants, devices, and special equipment available: yes     Site/side marked: yes     Immediately prior to procedure, a time out was called: yes     Patient identity confirmed:  Verbally with patient Anesthesia (see MAR for exact dosages):    Anesthesia method: done by EDP. Laceration details:    Location:  Finger   Finger location:  R long finger   Length (cm):  2 Repair type:    Repair type:  Complex Pre-procedure details:    Preparation:  Patient was prepped and draped in usual sterile fashion and imaging obtained to evaluate for foreign bodies Exploration:    Hemostasis achieved with:  Direct pressure   Wound exploration: wound explored through full range of motion and entire depth of wound probed and visualized     Wound extent: underlying fracture     Contaminated: no   Treatment:    Area cleansed with:  Betadine (betadine, peroxide, NS solution soak/irrigation)   Amount of cleaning:  Extensive   Irrigation solution: as above.   Irrigation volume:  200   Irrigation method:  Tap (soak)   Visualized foreign bodies/material removed: no     Debridement:  Minimal Skin repair:    Repair method:  Sutures   Suture size:  4-0   Suture material:  Prolene   Suture technique:  Simple interrupted   Number of sutures:  2 Approximation:     Approximation:  Close Post-procedure details:    Dressing:  Antibiotic ointment, non-adherent dressing and splint for protection   Patient tolerance of procedure:  Tolerated well, no immediate complications .Nail Removal  Date/Time: 06/13/2020 7:13 AM Performed by: Kinnie Feil, PA-C Authorized by: Kinnie Feil, PA-C   Consent:    Consent obtained:  Verbal   Consent given by:  Patient   Risks discussed:  Bleeding, incomplete removal and permanent nail deformity   Alternatives discussed:  Alternative treatment Location:    Hand:  R long finger Pre-procedure details:    Procedure prep: betadine, peroxide, NS solution soak/irrigation. Anesthesia (see MAR for exact dosages):    Anesthesia method:  Local infiltration   Local anesthetic: done by EDP. Trephination:    Subungual hematoma drained: yes     Trephination instrument: separation of nail from nail bed. Post-procedure details:    Dressing:  Gauze roll, antibiotic ointment and petrolatum-impregnated gauze   Patient tolerance of procedure:  Tolerated well, no immediate complications     Robin [name] 23/30/07 6226    Robin Fuel, MD 33/35/45 [phone]

## 2020-06-18 ENCOUNTER — Encounter: Payer: Self-pay | Admitting: Family Medicine

## 2020-06-18 MED ORDER — OXYCODONE-ACETAMINOPHEN 5-325 MG PO TABS: 1.0000 | ORAL_TABLET | Freq: Three times a day (TID) | ORAL | 0 refills | Status: DC | PRN

## 2020-06-18 NOTE — Telephone Encounter (Signed)
Patient returns call to nurse line to check on status of receiving rx refill.   Talbot Grumbling, RN

## 2020-06-22 ENCOUNTER — Other Ambulatory Visit: Payer: Self-pay | Admitting: Family Medicine

## 2020-06-22 DIAGNOSIS — S62632D Displaced fracture of distal phalanx of right middle finger, subsequent encounter for fracture with routine healing: Secondary | ICD-10-CM

## 2020-06-22 DIAGNOSIS — G894 Chronic pain syndrome: Secondary | ICD-10-CM

## 2020-06-22 MED ORDER — OXYCODONE-ACETAMINOPHEN 5-325 MG PO TABS: 1.0000 | ORAL_TABLET | Freq: Two times a day (BID) | ORAL | 0 refills | Status: DC | PRN

## 2020-06-22 NOTE — Progress Notes (Signed)
Chronic pain with acute distal phalanx fracture.  Original Percocet rx was sent in with error. RX read "Take 1 tablet TID PRN" #30. RF0  Prescription is supposed to be Take 1 tablet BID PRN. #60. RF0  Given acute pain, I will provide a few extra just this one month. Going forward prescription will be 1 tablet BID PRN. #60, RFx0.

## 2020-06-25 DIAGNOSIS — M25641 Stiffness of right hand, not elsewhere classified: Secondary | ICD-10-CM | POA: Diagnosis not present

## 2020-06-25 DIAGNOSIS — S62632A Displaced fracture of distal phalanx of right middle finger, initial encounter for closed fracture: Secondary | ICD-10-CM | POA: Diagnosis not present

## 2020-06-25 DIAGNOSIS — M79644 Pain in right finger(s): Secondary | ICD-10-CM | POA: Diagnosis not present

## 2020-06-28 ENCOUNTER — Other Ambulatory Visit: Payer: Self-pay | Admitting: Family Medicine

## 2020-07-07 DIAGNOSIS — M79644 Pain in right finger(s): Secondary | ICD-10-CM | POA: Diagnosis not present

## 2020-07-13 DIAGNOSIS — M79644 Pain in right finger(s): Secondary | ICD-10-CM | POA: Diagnosis not present

## 2020-07-14 DIAGNOSIS — E119 Type 2 diabetes mellitus without complications: Secondary | ICD-10-CM | POA: Diagnosis not present

## 2020-07-14 LAB — HM DIABETES EYE EXAM

## 2020-08-03 DIAGNOSIS — S62612D Displaced fracture of proximal phalanx of right middle finger, subsequent encounter for fracture with routine healing: Secondary | ICD-10-CM | POA: Diagnosis not present

## 2020-08-07 ENCOUNTER — Other Ambulatory Visit: Payer: Self-pay | Admitting: Family Medicine

## 2020-08-07 DIAGNOSIS — S62632D Displaced fracture of distal phalanx of right middle finger, subsequent encounter for fracture with routine healing: Secondary | ICD-10-CM

## 2020-08-07 DIAGNOSIS — G894 Chronic pain syndrome: Secondary | ICD-10-CM

## 2020-08-07 MED ORDER — OXYCODONE-ACETAMINOPHEN 5-325 MG PO TABS: 1.0000 | ORAL_TABLET | Freq: Two times a day (BID) | ORAL | 0 refills | Status: DC | PRN

## 2020-08-13 DIAGNOSIS — M79644 Pain in right finger(s): Secondary | ICD-10-CM | POA: Diagnosis not present

## 2020-08-18 DIAGNOSIS — Z23 Encounter for immunization: Secondary | ICD-10-CM | POA: Diagnosis not present

## 2020-09-01 DIAGNOSIS — S62612D Displaced fracture of proximal phalanx of right middle finger, subsequent encounter for fracture with routine healing: Secondary | ICD-10-CM | POA: Diagnosis not present

## 2020-09-11 ENCOUNTER — Other Ambulatory Visit: Payer: Self-pay | Admitting: Family Medicine

## 2020-09-11 DIAGNOSIS — S62632D Displaced fracture of distal phalanx of right middle finger, subsequent encounter for fracture with routine healing: Secondary | ICD-10-CM

## 2020-09-11 DIAGNOSIS — G894 Chronic pain syndrome: Secondary | ICD-10-CM

## 2020-09-14 MED ORDER — OXYCODONE-ACETAMINOPHEN 5-325 MG PO TABS: 1.0000 | ORAL_TABLET | Freq: Two times a day (BID) | ORAL | 0 refills | Status: DC | PRN

## 2020-09-26 DIAGNOSIS — Z23 Encounter for immunization: Secondary | ICD-10-CM | POA: Diagnosis not present

## 2020-10-21 ENCOUNTER — Other Ambulatory Visit: Payer: Self-pay | Admitting: Family Medicine

## 2020-10-21 DIAGNOSIS — G894 Chronic pain syndrome: Secondary | ICD-10-CM

## 2020-10-21 DIAGNOSIS — S62632D Displaced fracture of distal phalanx of right middle finger, subsequent encounter for fracture with routine healing: Secondary | ICD-10-CM

## 2020-10-22 MED ORDER — OXYCODONE-ACETAMINOPHEN 5-325 MG PO TABS: 1.0000 | ORAL_TABLET | Freq: Two times a day (BID) | ORAL | 0 refills | Status: DC | PRN

## 2020-10-26 ENCOUNTER — Other Ambulatory Visit: Payer: Self-pay | Admitting: *Deleted

## 2020-10-26 ENCOUNTER — Other Ambulatory Visit: Payer: Self-pay | Admitting: Family Medicine

## 2020-10-26 DIAGNOSIS — Z1231 Encounter for screening mammogram for malignant neoplasm of breast: Secondary | ICD-10-CM

## 2020-11-17 ENCOUNTER — Other Ambulatory Visit: Payer: Self-pay

## 2020-11-17 ENCOUNTER — Ambulatory Visit
Admission: RE | Admit: 2020-11-17 | Discharge: 2020-11-17 | Disposition: A | Discharge status: Home / Self Care | Payer: Medicare PPO | Source: Ambulatory Visit

## 2020-11-17 DIAGNOSIS — Z1231 Encounter for screening mammogram for malignant neoplasm of breast: Secondary | ICD-10-CM

## 2020-11-26 ENCOUNTER — Other Ambulatory Visit: Payer: Self-pay | Admitting: Family Medicine

## 2020-11-26 DIAGNOSIS — G894 Chronic pain syndrome: Secondary | ICD-10-CM

## 2020-11-26 DIAGNOSIS — S62632D Displaced fracture of distal phalanx of right middle finger, subsequent encounter for fracture with routine healing: Secondary | ICD-10-CM

## 2020-11-26 MED ORDER — OXYCODONE-ACETAMINOPHEN 5-325 MG PO TABS: 1.0000 | ORAL_TABLET | Freq: Two times a day (BID) | ORAL | 0 refills | Status: DC | PRN

## 2020-12-02 DIAGNOSIS — I739 Peripheral vascular disease, unspecified: Secondary | ICD-10-CM | POA: Diagnosis not present

## 2020-12-02 DIAGNOSIS — Z95828 Presence of other vascular implants and grafts: Secondary | ICD-10-CM | POA: Diagnosis not present

## 2020-12-08 NOTE — Progress Notes (Signed)
Subjective:   Patient ID: Robin [name]    DOB: [birth year], 64 y.o. female   MRN: [id number]  Robin [name] is a 65 y.o. female with a history of abdominal aorta injury, HTN, PAD, paroxysmal tachycardia on holter monitor 2015, allergic rhinitis, DM multiple thyroid nodules, vaginal atrophy, h/o asthma, h/o tobacco abuse, HLD here for diabetes follow up  Acute Concerns: 1. Weight Loss: patient notes that she continues to lose weight but doesn't know why. Endorses normal PO intake. Denies cough, hemoptysis, diarrhea. Occasional constipation. Last bowel movement yesterday at was normal. Denies abdominal pain or difficulty swallowing. Denies depression. PHQ-9 = 0. She had colonoscopy in 2018 that was normal. She is due for her papsmear. Mammogram on 11/17/20 was normal. She does have a smoking history - has smoked since 65 years old. Up to 1PPD x 1-2 years, average 1/2 PPD x ~15 years. She quit for some years in between. Currently vapes. Pack year history is ~10. She would not qualify for low dose CT scan. Denies night sweats. Always has aches and pains.  Diabetes: Last three A1C's below. Currently on  Jardiance 10mg  QD, Metformin 1000mg  QD. Endorses compliance CBG's range 130-150. Denies any hypoglycemia. Denies any polyuria, polydipsia, polyphagia. Due for diabetic foot exam.  Lab Results  Component Value Date   HGBA1C 7.4 (A) 12/09/2020   HGBA1C 6.7 03/24/2020   HGBA1C 7.2 (A) 08/14/2019    HTN:  BP: 128/72 today. Currently prescribed HCTZ 25mg  QD and Lisinopril 40mg  QD. Was having low blood pressures down in the 95'A systolic. Starting taking it every 2-3 days and noted improvement in blood pressures. Endorses compliance. Current every day smoker of vaping - a little each day. No sure miligrams. Denies any chest pain, SOB, vision changes, or headaches.   Lab Results  Component Value Date   CREATININE 0.96 12/09/2020   CREATININE 0.93 08/14/2019   CREATININE 0.91  09/25/2018    HLD: Last lipid panel below. Currently on Atorvastatin 40mg  QD. Endorses compliance. Denies any muscles aches or weakness. The 10-year ASCVD risk score Mikey Bussing DC Jr., et al., 2013) is: 28.7%   Lab Results  Component Value Date   CHOL 152 12/09/2020   HDL 53 12/09/2020   LDLCALC 91 12/09/2020   LDLDIRECT 111 (H) 07/31/2013   TRIG 34 12/09/2020   CHOLHDL 2.9 12/09/2020   Health Maintenance: Health Maintenance Due  Topic  . COVID-19 Vaccine (1)  . Zoster Vaccines- Shingrix (1 of 2)  . PAP SMEAR-Modifier    Review of Systems:  Per HPI.   Objective:   BP 128/72   Pulse 85   Wt 116 lb 12.8 oz (53 kg)   LMP 08/11/2011   SpO2 99%   BMI 20.05 kg/m  Vitals and nursing note reviewed.  General: pleasant older thin female, sitting comfortably in exam chair, well nourished, well developed, in no acute distress with non-toxic appearance HEENT: normocephalic, atraumatic, moist mucous membranes, oropharynx clear without erythema or exudate, TM normal bilaterally Neck: supple, non-tender without lymphadenopathy, no thyromegaly, no nodules appreciated on my exam CV: regular rate and rhythm without murmurs, rubs, or gallops, no lower extremity edema, 2+ radial and pedal pulses bilaterally Lungs: clear to auscultation bilaterally with normal work of breathing on room air, speaking in full sentences Abdomen: soft, non-tender, non-distended,  normoactive bowel sounds Skin: warm, dry MSK:  gait normal Neuro: Alert and oriented, speech normal  Diabetic foot exam was performed with the following findings:   No deformities, ulcerations, or other  skin breakdown Intact posterior tibialis and dorsalis pedis pulses Mildly decreased sensation along 1st toe bilaterally    Depression screen [facility] 2/9 12/09/2020 03/24/2020 08/14/2019  Decreased Interest 0 0 -  Down, Depressed, Hopeless 0 0 0  PHQ - 2 Score 0 0 0  Altered sleeping 0 0 -  Tired, decreased energy 0 0 -  Change in appetite 0  0 -  Feeling bad or failure about yourself  0 0 -  Trouble concentrating 0 0 -  Moving slowly or fidgety/restless 0 0 -  Suicidal thoughts 0 0 -  PHQ-9 Score 0 0 -  Difficult doing work/chores - - -  Some recent data might be hidden   Assessment & Plan:   Essential hypertension, benign Chronic, well controlled. Does endorse symptoms concerning for orthostatic hypotension and occasional low BP's in 79'Y systolic. Some improvement with TIW dosing of Lisinopril.  - decrease Lisinopril 20mg  QD - continue HCTZ 25mg  QD - follow up 1 month for BP check - BMP today to monitor kidney function  Diabetes mellitus type II, controlled Chronic, just slightly above goal of 7. Jardiance not covered by insurance - transition to Invokana 300mg  QD. Continue Metformin BID.  Follow up 3 months Diabetic foot exam performed today  HYPERLIPIDEMIA Chronic. Well controlled. - continue Atorvastatin 40mg  QD - lipid panel today   Weight loss Patient concerned for weight loss. Appears this has been a slow decline. 3lb weight loss since September 2021, 15lb weight loss in 3 years. She is almost completely up to date on cancer screening except papsmear - she was encouraged to follow up for that at earliest convenience. She does have smoking history but no respiratory symptoms. She does not qualify for CT scan at this time. Consider obtaining regardless if weight loss continues at follow up. Diabetes overall well controlled. No GI symptoms. Depression screening negative.  - TSH, CBC with diff, CMP today to evaluate  - encouraged to follow up for papsmear - follow up 1 month for continued monitoring  - smoking cessation strongly encouraged  Orders Placed This Encounter  Procedures  . Lipid Panel  . TSH  . CBC with Differential  . Comprehensive metabolic panel  . POCT glycosylated hemoglobin (Hb A1C)   Meds ordered this encounter  Medications  . fluticasone (FLONASE) 50 MCG/ACT nasal spray    Sig: Place 2  sprays into both nostrils daily.    Dispense:  16 g    Refill:  6  . lisinopril (ZESTRIL) 20 MG tablet    Sig: Take 1 tablet (20 mg total) by mouth at bedtime.    Dispense:  90 tablet    Refill:  3  . canagliflozin (INVOKANA) 300 MG TABS tablet    Sig: Take 1 tablet (300 mg total) by mouth daily before breakfast.    Dispense:  90 tablet    Refill:  [place], DO PGY-3, [facility] 12/10/2020 12:12 PM

## 2020-12-09 ENCOUNTER — Other Ambulatory Visit: Payer: Self-pay

## 2020-12-09 ENCOUNTER — Ambulatory Visit (INDEPENDENT_AMBULATORY_CARE_PROVIDER_SITE_OTHER): Payer: Medicare PPO | Admitting: Family Medicine

## 2020-12-09 ENCOUNTER — Encounter: Payer: Self-pay | Admitting: Family Medicine

## 2020-12-09 VITALS — BP 128/72 | HR 85 | Wt 116.8 lb

## 2020-12-09 DIAGNOSIS — Z87891 Personal history of nicotine dependence: Secondary | ICD-10-CM

## 2020-12-09 DIAGNOSIS — E782 Mixed hyperlipidemia: Secondary | ICD-10-CM

## 2020-12-09 DIAGNOSIS — J309 Allergic rhinitis, unspecified: Secondary | ICD-10-CM | POA: Diagnosis not present

## 2020-12-09 DIAGNOSIS — E118 Type 2 diabetes mellitus with unspecified complications: Secondary | ICD-10-CM

## 2020-12-09 DIAGNOSIS — F17209 Nicotine dependence, unspecified, with unspecified nicotine-induced disorders: Secondary | ICD-10-CM | POA: Diagnosis not present

## 2020-12-09 DIAGNOSIS — R634 Abnormal weight loss: Secondary | ICD-10-CM

## 2020-12-09 DIAGNOSIS — I10 Essential (primary) hypertension: Secondary | ICD-10-CM | POA: Diagnosis not present

## 2020-12-09 LAB — POCT GLYCOSYLATED HEMOGLOBIN (HGB A1C): HbA1c, POC (controlled diabetic range): 7.4 % — AB (ref 0.0–7.0)

## 2020-12-09 MED ORDER — CANAGLIFLOZIN 300 MG PO TABS: 300.0000 mg | ORAL_TABLET | Freq: Every day | ORAL | 1 refills | Status: DC

## 2020-12-09 MED ORDER — FLUTICASONE PROPIONATE 50 MCG/ACT NA SUSP: 2.0000 | Freq: Every day | NASAL | 6 refills | Status: DC

## 2020-12-09 MED ORDER — LISINOPRIL 20 MG PO TABS: 20.0000 mg | ORAL_TABLET | Freq: Every day | ORAL | 3 refills | Status: DC

## 2020-12-09 NOTE — Patient Instructions (Addendum)
Diabetes: Transition from [place] to [place]. Please llet me know if this isnt cheaper Continue your Metformin 1000mg  twice a day   Blood pressure: Decrease your Lisinopril to 20mg  daily  Please follow up for your papsmear at your earliest convenience   Please get your Shingles vaccine from yoru local pharmacy at your earliest convenience  We are checking some labs today, I will call you if they are abnormal will send you a MyChart message or a letter if they are normal.  If you do not hear about your labs in the next 2 weeks please let us know.  BRING ALL OF YOUR MEDICATIONS WITH YOU TO EVERY VISIT   You should return to our clinic in 3 months for diabetes.   Best Wishes,   Mina Marble, DO

## 2020-12-10 DIAGNOSIS — F17209 Nicotine dependence, unspecified, with unspecified nicotine-induced disorders: Secondary | ICD-10-CM | POA: Insufficient documentation

## 2020-12-10 DIAGNOSIS — R634 Abnormal weight loss: Secondary | ICD-10-CM | POA: Insufficient documentation

## 2020-12-10 LAB — COMPREHENSIVE METABOLIC PANEL
ALT: 10 IU/L (ref 0–32)
AST: 12 IU/L (ref 0–40)
Albumin/Globulin Ratio: 1.5 (ref 1.2–2.2)
Albumin: 3.9 g/dL (ref 3.8–4.8)
Alkaline Phosphatase: 58 IU/L (ref 44–121)
BUN/Creatinine Ratio: 28 (ref 12–28)
BUN: 27 mg/dL (ref 8–27)
Bilirubin Total: 0.2 mg/dL (ref 0.0–1.2)
CO2: 25 mmol/L (ref 20–29)
Calcium: 9.4 mg/dL (ref 8.7–10.3)
Chloride: 104 mmol/L (ref 96–106)
Creatinine, Ser: 0.96 mg/dL (ref 0.57–1.00)
Globulin, Total: 2.6 g/dL (ref 1.5–4.5)
Glucose: 104 mg/dL — ABNORMAL HIGH (ref 65–99)
Potassium: 4.3 mmol/L (ref 3.5–5.2)
Sodium: 141 mmol/L (ref 134–144)
Total Protein: 6.5 g/dL (ref 6.0–8.5)
eGFR: 66 mL/min/{1.73_m2} (ref >59)

## 2020-12-10 LAB — CBC WITH DIFFERENTIAL/PLATELET
Basophils Absolute: 0 10*3/uL (ref 0.0–0.2)
Basos: 0 %
EOS (ABSOLUTE): 0.1 10*3/uL (ref 0.0–0.4)
Eos: 1 %
Hematocrit: 36.7 % (ref 34.0–46.6)
Hemoglobin: 11.6 g/dL (ref 11.1–15.9)
Immature Grans (Abs): 0 10*3/uL (ref 0.0–0.1)
Immature Granulocytes: 0 %
Lymphocytes Absolute: 3 10*3/uL (ref 0.7–3.1)
Lymphs: 40 %
MCH: 28.4 pg (ref 26.6–33.0)
MCHC: 31.6 g/dL (ref 31.5–35.7)
MCV: 90 fL (ref 79–97)
Monocytes Absolute: 0.7 10*3/uL (ref 0.1–0.9)
Monocytes: 9 %
Neutrophils Absolute: 3.7 10*3/uL (ref 1.4–7.0)
Neutrophils: 50 %
Platelets: 353 10*3/uL (ref 150–450)
RBC: 4.08 x10E6/uL (ref 3.77–5.28)
RDW: 13.8 % (ref 11.7–15.4)
WBC: 7.6 10*3/uL (ref 3.4–10.8)

## 2020-12-10 LAB — LIPID PANEL
Chol/HDL Ratio: 2.9 ratio (ref 0.0–4.4)
Cholesterol, Total: 152 mg/dL (ref 100–199)
HDL: 53 mg/dL (ref >39)
LDL Chol Calc (NIH): 91 mg/dL (ref 0–99)
Triglycerides: 34 mg/dL (ref 0–149)
VLDL Cholesterol Cal: 8 mg/dL (ref 5–40)

## 2020-12-10 LAB — TSH: TSH: 0.484 u[IU]/mL (ref 0.450–4.500)

## 2020-12-10 NOTE — Assessment & Plan Note (Signed)
Chronic. Well controlled. - continue Atorvastatin 40mg  QD - lipid panel today

## 2020-12-10 NOTE — Assessment & Plan Note (Signed)
Chronic, well controlled. Does endorse symptoms concerning for orthostatic hypotension and occasional low BP's in 22'I systolic. Some improvement with TIW dosing of Lisinopril.  - decrease Lisinopril 20mg  QD - continue HCTZ 25mg  QD - follow up 1 month for BP check - BMP today to monitor kidney function

## 2020-12-10 NOTE — Assessment & Plan Note (Signed)
Chronic, just slightly above goal of 7. Jardiance not covered by insurance - transition to Invokana 300mg  QD. Continue Metformin BID.  Follow up 3 months Diabetic foot exam performed today

## 2020-12-10 NOTE — Assessment & Plan Note (Addendum)
Patient concerned for weight loss. Appears this has been a slow decline. 3lb weight loss since September 2021, 15lb weight loss in 3 years. She is almost completely up to date on cancer screening except papsmear. She does have smoking history but no respiratory symptoms. She does not qualify for lung cancer screen based on her Pack year history at this time. Consider obtaining regardless of history if weight loss continues at follow up. Diabetes overall well controlled. No GI symptoms. Depression screening negative.  - TSH, CBC with diff, CMP today to evaluate  - encouraged to follow up for papsmear - follow up 1 month for continued monitoring  - smoking cessation strongly encouraged

## 2021-01-05 ENCOUNTER — Other Ambulatory Visit: Payer: Self-pay | Admitting: Family Medicine

## 2021-01-05 DIAGNOSIS — G894 Chronic pain syndrome: Secondary | ICD-10-CM

## 2021-01-05 DIAGNOSIS — S62632D Displaced fracture of distal phalanx of right middle finger, subsequent encounter for fracture with routine healing: Secondary | ICD-10-CM

## 2021-01-05 MED ORDER — OXYCODONE-ACETAMINOPHEN 5-325 MG PO TABS: 1.0000 | ORAL_TABLET | Freq: Two times a day (BID) | ORAL | 0 refills | Status: DC | PRN

## 2021-02-06 ENCOUNTER — Other Ambulatory Visit: Payer: Self-pay | Admitting: Family Medicine

## 2021-02-06 DIAGNOSIS — I10 Essential (primary) hypertension: Secondary | ICD-10-CM

## 2021-02-10 ENCOUNTER — Encounter: Payer: Self-pay | Admitting: Family Medicine

## 2021-02-10 DIAGNOSIS — S62632D Displaced fracture of distal phalanx of right middle finger, subsequent encounter for fracture with routine healing: Secondary | ICD-10-CM

## 2021-02-10 DIAGNOSIS — G894 Chronic pain syndrome: Secondary | ICD-10-CM

## 2021-02-10 MED ORDER — OXYCODONE-ACETAMINOPHEN 5-325 MG PO TABS: 1.0000 | ORAL_TABLET | Freq: Two times a day (BID) | ORAL | 0 refills | Status: DC | PRN

## 2021-03-04 DIAGNOSIS — E119 Type 2 diabetes mellitus without complications: Secondary | ICD-10-CM | POA: Diagnosis not present

## 2021-03-12 ENCOUNTER — Other Ambulatory Visit: Payer: Self-pay | Admitting: Family Medicine

## 2021-03-22 ENCOUNTER — Other Ambulatory Visit: Payer: Self-pay | Admitting: Family Medicine

## 2021-03-22 DIAGNOSIS — S62632D Displaced fracture of distal phalanx of right middle finger, subsequent encounter for fracture with routine healing: Secondary | ICD-10-CM

## 2021-03-22 DIAGNOSIS — E782 Mixed hyperlipidemia: Secondary | ICD-10-CM

## 2021-03-22 DIAGNOSIS — G894 Chronic pain syndrome: Secondary | ICD-10-CM

## 2021-03-23 ENCOUNTER — Other Ambulatory Visit: Payer: Self-pay | Admitting: Family Medicine

## 2021-03-23 ENCOUNTER — Encounter: Payer: Self-pay | Admitting: Family Medicine

## 2021-03-23 DIAGNOSIS — G894 Chronic pain syndrome: Secondary | ICD-10-CM

## 2021-03-23 DIAGNOSIS — S62632D Displaced fracture of distal phalanx of right middle finger, subsequent encounter for fracture with routine healing: Secondary | ICD-10-CM

## 2021-03-23 MED ORDER — OXYCODONE-ACETAMINOPHEN 5-325 MG PO TABS: 1.0000 | ORAL_TABLET | Freq: Two times a day (BID) | ORAL | 0 refills | Status: DC | PRN

## 2021-03-24 DIAGNOSIS — G8929 Other chronic pain: Secondary | ICD-10-CM | POA: Diagnosis not present

## 2021-03-24 DIAGNOSIS — Z7982 Long term (current) use of aspirin: Secondary | ICD-10-CM | POA: Diagnosis not present

## 2021-03-24 DIAGNOSIS — Z791 Long term (current) use of non-steroidal anti-inflammatories (NSAID): Secondary | ICD-10-CM | POA: Diagnosis not present

## 2021-03-24 DIAGNOSIS — E119 Type 2 diabetes mellitus without complications: Secondary | ICD-10-CM | POA: Diagnosis not present

## 2021-03-24 DIAGNOSIS — Z7984 Long term (current) use of oral hypoglycemic drugs: Secondary | ICD-10-CM | POA: Diagnosis not present

## 2021-03-24 DIAGNOSIS — K08109 Complete loss of teeth, unspecified cause, unspecified class: Secondary | ICD-10-CM | POA: Diagnosis not present

## 2021-03-24 DIAGNOSIS — E785 Hyperlipidemia, unspecified: Secondary | ICD-10-CM | POA: Diagnosis not present

## 2021-03-24 DIAGNOSIS — Z720 Tobacco use: Secondary | ICD-10-CM | POA: Diagnosis not present

## 2021-03-24 DIAGNOSIS — I10 Essential (primary) hypertension: Secondary | ICD-10-CM | POA: Diagnosis not present

## 2021-03-25 ENCOUNTER — Other Ambulatory Visit: Payer: Self-pay

## 2021-03-25 ENCOUNTER — Encounter: Payer: Self-pay | Admitting: Family Medicine

## 2021-03-25 DIAGNOSIS — S62632D Displaced fracture of distal phalanx of right middle finger, subsequent encounter for fracture with routine healing: Secondary | ICD-10-CM

## 2021-03-25 DIAGNOSIS — G894 Chronic pain syndrome: Secondary | ICD-10-CM

## 2021-03-25 MED ORDER — OXYCODONE-ACETAMINOPHEN 5-325 MG PO TABS: 1.0000 | ORAL_TABLET | Freq: Two times a day (BID) | ORAL | 0 refills | Status: DC | PRN

## 2021-03-25 NOTE — Telephone Encounter (Signed)
Received phone call from pharmacist that [place] was flagging with insurance that they do not have authority to prescribe this controlled substance (oxycodone-acetaminophen)  Pharmacist attempted override which was unsuccessful.   Will forward to preceptor.   Talbot Grumbling, RN

## 2021-03-26 ENCOUNTER — Other Ambulatory Visit: Payer: Self-pay | Admitting: Family Medicine

## 2021-03-26 DIAGNOSIS — E782 Mixed hyperlipidemia: Secondary | ICD-10-CM

## 2021-05-06 ENCOUNTER — Other Ambulatory Visit: Payer: Self-pay | Admitting: Family Medicine

## 2021-05-06 DIAGNOSIS — S62632D Displaced fracture of distal phalanx of right middle finger, subsequent encounter for fracture with routine healing: Secondary | ICD-10-CM

## 2021-05-06 DIAGNOSIS — G894 Chronic pain syndrome: Secondary | ICD-10-CM

## 2021-05-07 MED ORDER — OXYCODONE-ACETAMINOPHEN 5-325 MG PO TABS: 1.0000 | ORAL_TABLET | Freq: Two times a day (BID) | ORAL | 0 refills | Status: DC | PRN

## 2021-06-14 ENCOUNTER — Other Ambulatory Visit: Payer: Self-pay | Admitting: Family Medicine

## 2021-06-14 DIAGNOSIS — G894 Chronic pain syndrome: Secondary | ICD-10-CM

## 2021-06-14 DIAGNOSIS — S62632D Displaced fracture of distal phalanx of right middle finger, subsequent encounter for fracture with routine healing: Secondary | ICD-10-CM

## 2021-06-16 MED ORDER — OXYCODONE-ACETAMINOPHEN 5-325 MG PO TABS: 1.0000 | ORAL_TABLET | Freq: Two times a day (BID) | ORAL | 0 refills | Status: DC | PRN

## 2021-06-16 NOTE — Telephone Encounter (Signed)
[facility] before further refills  

## 2021-06-29 ENCOUNTER — Ambulatory Visit: Payer: Medicare PPO | Admitting: Family Medicine

## 2021-07-02 ENCOUNTER — Ambulatory Visit: Payer: Medicare PPO | Admitting: Family Medicine

## 2021-07-02 ENCOUNTER — Other Ambulatory Visit: Payer: Self-pay

## 2021-07-02 ENCOUNTER — Encounter: Payer: Self-pay | Admitting: Family Medicine

## 2021-07-02 VITALS — BP 141/81 | HR 77 | Ht 64.0 in | Wt 126.6 lb

## 2021-07-02 DIAGNOSIS — E782 Mixed hyperlipidemia: Secondary | ICD-10-CM

## 2021-07-02 DIAGNOSIS — G894 Chronic pain syndrome: Secondary | ICD-10-CM | POA: Diagnosis not present

## 2021-07-02 DIAGNOSIS — E118 Type 2 diabetes mellitus with unspecified complications: Secondary | ICD-10-CM | POA: Diagnosis not present

## 2021-07-02 DIAGNOSIS — Z23 Encounter for immunization: Secondary | ICD-10-CM | POA: Diagnosis not present

## 2021-07-02 LAB — POCT GLYCOSYLATED HEMOGLOBIN (HGB A1C): HbA1c, POC (controlled diabetic range): 7.3 % — AB (ref 0.0–7.0)

## 2021-07-02 MED ORDER — ATORVASTATIN CALCIUM 40 MG PO TABS: 40.0000 mg | ORAL_TABLET | Freq: Every day | ORAL | 2 refills | Status: DC

## 2021-07-02 NOTE — Patient Instructions (Signed)
It was great seeing you today!  Please check-out at the front desk before leaving the clinic. I'd like to see you back in 3-6 months but if you need to be seen earlier than that for any new issues we're happy to fit you in, just give [place] a call!  Visit Remembers: - Stop by the pharmacy to pick up your prescriptions  - Continue to work on your healthy eating habits and incorporating exercise into your daily life. (see below) - Your goal is to have an A1c < 8     Diet Recommendations for Diabetes  Carbohydrate includes starch, sugar, and fiber.  Of these, only sugar and starch raise blood glucose.  (Fiber is found in fruits, vegetables [especially skin, seeds, and stalks] and whole grains.)   Starchy (carb) foods: Bread, rice, pasta, potatoes, corn, cereal, grits, crackers, bagels, muffins, all baked goods.  (Fruit, milk, and yogurt also have carbohydrate, but most of these foods will not spike your blood sugar as most starchy foods will.)  A few fruits do cause high blood sugars; use small portions of bananas (limit to 1/2 at a time), grapes, watermelon, oranges, and most tropical fruits.   Protein foods: Meat, fish, poultry, eggs, dairy foods, and beans such as pinto and kidney beans (beans also provide carbohydrate).   1. Eat at least REAL 3 meals and 1-2 snacks per day. Never go more than 4-5 hours while awake without eating. Eat breakfast within the first hour of getting up.   2. Limit starchy foods to TWO per meal and ONE per snack. ONE portion of a starchy food is equal to the following:   - ONE slice of bread (or its equivalent, such as half of a hamburger bun).   - 1/2 cup of a "scoopable" starchy food such as potatoes or rice.   - 15 grams of Total Carbohydrate as shown on food label.   - Every 4 ounces of a sweet drink (including fruit juice). 3. Include at every meal: a protein food, a carb food, and vegetables and/or fruit.   - Obtain twice the volume of veg's as protein or  carbohydrate foods for both lunch and dinner.   - Fresh or frozen veg's are best.   - Keep frozen veg's on hand for a quick vegetable serving.       Regarding lab work today:  Due to recent changes in healthcare laws, you may see the results of your imaging and laboratory studies on MyChart before your doctor has had a chance to review them.  We understand that in some cases there may be results that are confusing or concerning to you. Not all laboratory results come back in the same time frame and your doctor may be waiting for multiple results in order to interpret others.  Please give [place] 72 hours in order for your doctor to thoroughly review all the results before contacting the office for clarification of your results. If everything is normal, you will get a letter in the mail or a message in My Chart. Please give [place] a call if you do not hear from [place] after 2 weeks.  Please bring all of your medications with you to each visit.    If you haven't already, sign up for My Chart to have easy access to your labs results, and communication with your primary care physician.  Feel free to call with any questions or concerns at any time, at [phone].   Take care,  Dr.  Howard
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
## 2021-07-03 LAB — BASIC METABOLIC PANEL
BUN/Creatinine Ratio: 34 — ABNORMAL HIGH (ref 12–28)
BUN: 32 mg/dL — ABNORMAL HIGH (ref 8–27)
CO2: 26 mmol/L (ref 20–29)
Calcium: 9.7 mg/dL (ref 8.7–10.3)
Chloride: 104 mmol/L (ref 96–106)
Creatinine, Ser: 0.93 mg/dL (ref 0.57–1.00)
Glucose: 83 mg/dL (ref 70–99)
Potassium: 4.2 mmol/L (ref 3.5–5.2)
Sodium: 141 mmol/L (ref 134–144)
eGFR: 69 mL/min/{1.73_m2} (ref >59)

## 2021-07-06 ENCOUNTER — Encounter: Payer: Self-pay | Admitting: Family Medicine
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
## 2021-07-06 NOTE — Assessment & Plan Note (Signed)
Chronic and stable.  Refilled Lipitor 40 mg daily.

## 2021-07-06 NOTE — Assessment & Plan Note (Signed)
Chronic and stable.  A1c today 7.3 and previously 7.4 in June.  She is not taking Invokana due to cost.  Diabetes is well controlled with metformin.  Continue metformin 1000 mg twice daily.  In less than 1 month she will be 66 and her A1c goal will change to 8.  Discontinue Invokana for now.   -BMP today - Follow-up in 3 months

## 2021-07-21 ENCOUNTER — Other Ambulatory Visit: Payer: Self-pay | Admitting: Family Medicine

## 2021-07-21 DIAGNOSIS — S62632D Displaced fracture of distal phalanx of right middle finger, subsequent encounter for fracture with routine healing: Secondary | ICD-10-CM

## 2021-07-21 DIAGNOSIS — G894 Chronic pain syndrome: Secondary | ICD-10-CM

## 2021-07-23 MED ORDER — OXYCODONE-ACETAMINOPHEN 5-325 MG PO TABS: 1.0000 | ORAL_TABLET | Freq: Two times a day (BID) | ORAL | 0 refills | Status: DC | PRN

## 2021-08-27 ENCOUNTER — Other Ambulatory Visit: Payer: Self-pay | Admitting: Family Medicine

## 2021-08-27 DIAGNOSIS — S62632D Displaced fracture of distal phalanx of right middle finger, subsequent encounter for fracture with routine healing: Secondary | ICD-10-CM

## 2021-08-27 DIAGNOSIS — G894 Chronic pain syndrome: Secondary | ICD-10-CM

## 2021-08-30 MED ORDER — OXYCODONE-ACETAMINOPHEN 5-325 MG PO TABS: 1.0000 | ORAL_TABLET | Freq: Two times a day (BID) | ORAL | 0 refills | Status: DC | PRN

## 2021-10-11 ENCOUNTER — Other Ambulatory Visit: Payer: Self-pay | Admitting: Family Medicine

## 2021-10-11 DIAGNOSIS — G894 Chronic pain syndrome: Secondary | ICD-10-CM

## 2021-10-11 DIAGNOSIS — S62632D Displaced fracture of distal phalanx of right middle finger, subsequent encounter for fracture with routine healing: Secondary | ICD-10-CM

## 2021-10-12 MED ORDER — OXYCODONE-ACETAMINOPHEN 5-325 MG PO TABS: 1.0000 | ORAL_TABLET | Freq: Two times a day (BID) | ORAL | 0 refills | Status: DC | PRN

## 2021-10-14 ENCOUNTER — Other Ambulatory Visit: Payer: Self-pay

## 2021-10-14 DIAGNOSIS — E1140 Type 2 diabetes mellitus with diabetic neuropathy, unspecified: Secondary | ICD-10-CM

## 2021-10-14 DIAGNOSIS — E782 Mixed hyperlipidemia: Secondary | ICD-10-CM

## 2021-10-14 DIAGNOSIS — I10 Essential (primary) hypertension: Secondary | ICD-10-CM

## 2021-10-14 MED ORDER — ATORVASTATIN CALCIUM 40 MG PO TABS: 40.0000 mg | ORAL_TABLET | Freq: Every day | ORAL | 0 refills | Status: DC

## 2021-10-14 MED ORDER — BD SWAB SINGLE USE REGULAR PADS: 1.0000 | MEDICATED_PAD | Freq: Two times a day (BID) | 1 refills | Status: DC

## 2021-10-14 MED ORDER — GLUCOSE BLOOD VI STRP: ORAL_STRIP | 12 refills | Status: DC

## 2021-10-14 MED ORDER — METFORMIN HCL 1000 MG PO TABS
1000.0000 mg | ORAL_TABLET | Freq: Two times a day (BID) | ORAL | 0 refills | Status: DC
Start: 2021-10-14 — End: 2022-04-18

## 2021-10-14 MED ORDER — FREESTYLE SYSTEM KIT: PACK | 0 refills | Status: DC

## 2021-10-14 MED ORDER — LISINOPRIL 20 MG PO TABS: 20.0000 mg | ORAL_TABLET | Freq: Every day | ORAL | 0 refills | Status: DC

## 2021-10-14 MED ORDER — HYDROCHLOROTHIAZIDE 25 MG PO TABS: 25.0000 mg | ORAL_TABLET | Freq: Every day | ORAL | 0 refills | Status: DC

## 2021-10-14 MED ORDER — UNISTIK 2 NORMAL MISC: 1 refills | Status: DC

## 2021-10-19 ENCOUNTER — Other Ambulatory Visit: Payer: Self-pay | Admitting: Family Medicine

## 2021-10-19 DIAGNOSIS — Z1231 Encounter for screening mammogram for malignant neoplasm of breast: Secondary | ICD-10-CM

## 2021-11-05 ENCOUNTER — Ambulatory Visit: Payer: Medicare PPO | Admitting: Family Medicine

## 2021-11-18 ENCOUNTER — Ambulatory Visit: Payer: Medicare PPO

## 2021-11-24 ENCOUNTER — Encounter: Payer: Self-pay | Admitting: Family Medicine

## 2021-11-24 ENCOUNTER — Ambulatory Visit (INDEPENDENT_AMBULATORY_CARE_PROVIDER_SITE_OTHER): Payer: Medicare HMO | Admitting: Family Medicine

## 2021-11-24 VITALS — BP 135/74 | HR 88 | Ht 64.0 in | Wt 123.6 lb

## 2021-11-24 DIAGNOSIS — E118 Type 2 diabetes mellitus with unspecified complications: Secondary | ICD-10-CM | POA: Diagnosis not present

## 2021-11-24 DIAGNOSIS — Z0000 Encounter for general adult medical examination without abnormal findings: Secondary | ICD-10-CM | POA: Diagnosis not present

## 2021-11-24 DIAGNOSIS — Z130 Encounter for screening for diseases of the blood and blood-forming organs and certain disorders involving the immune mechanism: Secondary | ICD-10-CM

## 2021-11-24 DIAGNOSIS — E782 Mixed hyperlipidemia: Secondary | ICD-10-CM

## 2021-11-24 DIAGNOSIS — I10 Essential (primary) hypertension: Secondary | ICD-10-CM

## 2021-11-24 DIAGNOSIS — Z23 Encounter for immunization: Secondary | ICD-10-CM

## 2021-11-24 DIAGNOSIS — I739 Peripheral vascular disease, unspecified: Secondary | ICD-10-CM | POA: Diagnosis not present

## 2021-11-24 DIAGNOSIS — Z13820 Encounter for screening for osteoporosis: Secondary | ICD-10-CM

## 2021-11-24 DIAGNOSIS — E042 Nontoxic multinodular goiter: Secondary | ICD-10-CM

## 2021-11-24 DIAGNOSIS — S62632D Displaced fracture of distal phalanx of right middle finger, subsequent encounter for fracture with routine healing: Secondary | ICD-10-CM

## 2021-11-24 DIAGNOSIS — Z1329 Encounter for screening for other suspected endocrine disorder: Secondary | ICD-10-CM

## 2021-11-24 DIAGNOSIS — G894 Chronic pain syndrome: Secondary | ICD-10-CM

## 2021-11-24 DIAGNOSIS — F17209 Nicotine dependence, unspecified, with unspecified nicotine-induced disorders: Secondary | ICD-10-CM

## 2021-11-24 LAB — POCT GLYCOSYLATED HEMOGLOBIN (HGB A1C): HbA1c, POC (controlled diabetic range): 8.5 % — AB (ref 0.0–7.0)

## 2021-11-24 MED ORDER — OXYCODONE-ACETAMINOPHEN 5-325 MG PO TABS: 1.0000 | ORAL_TABLET | Freq: Two times a day (BID) | ORAL | 0 refills | Status: DC | PRN

## 2021-11-24 NOTE — Assessment & Plan Note (Signed)
Patient not yet ready to contemplate quitting. Will continue to check in. Does not meet screening qualifications for lung cancer low dose CT. ?

## 2021-11-24 NOTE — Patient Instructions (Addendum)
It was a pleasure to see you today! ? ?Keep up the good work taking care of yourself! ?You got your pneumonia vaccine today ?We will get some labs today.  If they are abnormal or we need to do something about them, I will call you.  If they are normal, I will send you a message on MyChart (if it is active) or a letter in the mail.  If you don't hear from [place] in 2 weeks, please call the office  [phone]. ?Return on June 20th for a pap smear ?I recommend you get a dexa scan or bone density test this year. You can schedule this at your mammogram visit ? ?Be Well, ? ?Dr. Chauncey Reading ? ?
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
## 2021-11-24 NOTE — Assessment & Plan Note (Signed)
Chronic, at goal, asymptomatic. Continue current regimen. ?

## 2021-11-24 NOTE — Assessment & Plan Note (Signed)
Will check lipid panel and CMP today.  ?

## 2021-11-24 NOTE — Assessment & Plan Note (Signed)
Check A1c today. Will also check UACR. On a statin and ACE-I. ?

## 2021-11-24 NOTE — Assessment & Plan Note (Addendum)
Reviewed PDMP, it is appropriate. Patient has been compliant with medication use. Refill oxycodone-acetaminophen 5-325, #60 per month. Reviewed recommendation for narcan OTC. Patient has never had overdose. ?

## 2021-11-24 NOTE — Assessment & Plan Note (Signed)
Check TSH given thyroid history and fatigue.  ?

## 2021-11-25 ENCOUNTER — Other Ambulatory Visit: Payer: Self-pay | Admitting: Family Medicine

## 2021-11-25 DIAGNOSIS — E1165 Type 2 diabetes mellitus with hyperglycemia: Secondary | ICD-10-CM

## 2021-11-25 DIAGNOSIS — E782 Mixed hyperlipidemia: Secondary | ICD-10-CM

## 2021-11-25 LAB — CBC WITH DIFFERENTIAL/PLATELET
Basophils Absolute: 0 10*3/uL (ref 0.0–0.2)
Basos: 0 %
EOS (ABSOLUTE): 0.1 10*3/uL (ref 0.0–0.4)
Eos: 2 %
Hematocrit: 36.2 % (ref 34.0–46.6)
Hemoglobin: 12 g/dL (ref 11.1–15.9)
Immature Grans (Abs): 0 10*3/uL (ref 0.0–0.1)
Immature Granulocytes: 0 %
Lymphocytes Absolute: 3 10*3/uL (ref 0.7–3.1)
Lymphs: 39 %
MCH: 30.1 pg (ref 26.6–33.0)
MCHC: 33.1 g/dL (ref 31.5–35.7)
MCV: 91 fL (ref 79–97)
Monocytes Absolute: 0.5 10*3/uL (ref 0.1–0.9)
Monocytes: 6 %
Neutrophils Absolute: 4 10*3/uL (ref 1.4–7.0)
Neutrophils: 53 %
Platelets: 334 10*3/uL (ref 150–450)
RBC: 3.99 x10E6/uL (ref 3.77–5.28)
RDW: 11.9 % (ref 11.7–15.4)
WBC: 7.6 10*3/uL (ref 3.4–10.8)

## 2021-11-25 LAB — LIPID PANEL
Chol/HDL Ratio: 2.8 ratio (ref 0.0–4.4)
Cholesterol, Total: 153 mg/dL (ref 100–199)
HDL: 55 mg/dL (ref >39)
LDL Chol Calc (NIH): 85 mg/dL (ref 0–99)
Triglycerides: 64 mg/dL (ref 0–149)
VLDL Cholesterol Cal: 13 mg/dL (ref 5–40)

## 2021-11-25 LAB — COMPREHENSIVE METABOLIC PANEL
ALT: 13 IU/L (ref 0–32)
AST: 10 IU/L (ref 0–40)
Albumin/Globulin Ratio: 1.5 (ref 1.2–2.2)
Albumin: 4.2 g/dL (ref 3.8–4.8)
Alkaline Phosphatase: 72 IU/L (ref 44–121)
BUN/Creatinine Ratio: 17 (ref 12–28)
BUN: 16 mg/dL (ref 8–27)
Bilirubin Total: 0.3 mg/dL (ref 0.0–1.2)
CO2: 23 mmol/L (ref 20–29)
Calcium: 10 mg/dL (ref 8.7–10.3)
Chloride: 100 mmol/L (ref 96–106)
Creatinine, Ser: 0.95 mg/dL (ref 0.57–1.00)
Globulin, Total: 2.8 g/dL (ref 1.5–4.5)
Glucose: 111 mg/dL — ABNORMAL HIGH (ref 70–99)
Potassium: 3.8 mmol/L (ref 3.5–5.2)
Sodium: 137 mmol/L (ref 134–144)
Total Protein: 7 g/dL (ref 6.0–8.5)
eGFR: 66 mL/min/{1.73_m2} (ref >59)

## 2021-11-25 LAB — MICROALBUMIN / CREATININE URINE RATIO
Creatinine, Urine: 156.6 mg/dL
Microalb/Creat Ratio: 3 mg/g creat (ref 0–29)
Microalbumin, Urine: 5.1 ug/mL

## 2021-11-25 LAB — TSH: TSH: 0.616 u[IU]/mL (ref 0.450–4.500)

## 2021-11-25 MED ORDER — EMPAGLIFLOZIN 10 MG PO TABS: 10.0000 mg | ORAL_TABLET | Freq: Every day | ORAL | 3 refills | Status: DC

## 2021-11-25 MED ORDER — ATORVASTATIN CALCIUM 80 MG PO TABS: 80.0000 mg | ORAL_TABLET | Freq: Every day | ORAL | 3 refills | Status: DC

## 2021-11-26 ENCOUNTER — Ambulatory Visit
Admission: RE | Admit: 2021-11-26 | Discharge: 2021-11-26 | Disposition: A | Discharge status: Home / Self Care | Payer: Medicare HMO | Source: Ambulatory Visit | Attending: Family Medicine | Admitting: Family Medicine

## 2021-11-26 DIAGNOSIS — Z1231 Encounter for screening mammogram for malignant neoplasm of breast: Secondary | ICD-10-CM

## 2021-12-14 ENCOUNTER — Encounter: Payer: Self-pay | Admitting: *Deleted

## 2021-12-27 ENCOUNTER — Other Ambulatory Visit: Payer: Self-pay | Admitting: Family Medicine

## 2021-12-27 DIAGNOSIS — I10 Essential (primary) hypertension: Secondary | ICD-10-CM

## 2021-12-28 ENCOUNTER — Ambulatory Visit: Payer: Medicare HMO | Admitting: Family Medicine

## 2021-12-28 NOTE — Progress Notes (Deleted)
    SUBJECTIVE:   CHIEF COMPLAINT / HPI:   Pap smear:   Diabetes: A1c increased to 8.5%, recommend SLGT2 or GLP1. Patient opted for jardiance, on 10 mg daily and metformin 1000 mg BID. UACR was WNL last month. ** bgs  HLD: Last ASCVD risk calculated to be high at 33%, though goal LDL close to 70 at 85. Increased to 80 mg atorvastatin, patient states she is taking this.   PERTINENT  PMH / PSH: ***  OBJECTIVE:   LMP 08/11/2011   ***  ASSESSMENT/PLAN:   No problem-specific Assessment & Plan notes found for this encounter.     Gladys Damme, MD [facility]

## 2021-12-31 ENCOUNTER — Other Ambulatory Visit: Payer: Self-pay | Admitting: Family Medicine

## 2021-12-31 DIAGNOSIS — G894 Chronic pain syndrome: Secondary | ICD-10-CM

## 2021-12-31 DIAGNOSIS — S62632D Displaced fracture of distal phalanx of right middle finger, subsequent encounter for fracture with routine healing: Secondary | ICD-10-CM

## 2021-12-31 MED ORDER — OXYCODONE-ACETAMINOPHEN 5-325 MG PO TABS: 1.0000 | ORAL_TABLET | Freq: Two times a day (BID) | ORAL | 0 refills | Status: DC | PRN

## 2022-01-05 ENCOUNTER — Ambulatory Visit (INDEPENDENT_AMBULATORY_CARE_PROVIDER_SITE_OTHER): Payer: Medicare HMO | Admitting: Family Medicine

## 2022-01-05 ENCOUNTER — Other Ambulatory Visit (HOSPITAL_COMMUNITY)
Admission: RE | Admit: 2022-01-05 | Discharge: 2022-01-05 | Disposition: A | Discharge status: Home / Self Care | Payer: Medicare HMO | Source: Ambulatory Visit | Attending: Family Medicine | Admitting: Family Medicine

## 2022-01-05 ENCOUNTER — Encounter: Payer: Self-pay | Admitting: Family Medicine

## 2022-01-05 ENCOUNTER — Other Ambulatory Visit: Payer: Self-pay | Admitting: Family Medicine

## 2022-01-05 VITALS — BP 136/82 | HR 98 | Ht 64.0 in | Wt 123.2 lb

## 2022-01-05 DIAGNOSIS — Z1151 Encounter for screening for human papillomavirus (HPV): Secondary | ICD-10-CM | POA: Diagnosis not present

## 2022-01-05 DIAGNOSIS — N898 Other specified noninflammatory disorders of vagina: Secondary | ICD-10-CM

## 2022-01-05 DIAGNOSIS — Z0000 Encounter for general adult medical examination without abnormal findings: Secondary | ICD-10-CM

## 2022-01-05 DIAGNOSIS — Z124 Encounter for screening for malignant neoplasm of cervix: Secondary | ICD-10-CM | POA: Insufficient documentation

## 2022-01-05 DIAGNOSIS — Z113 Encounter for screening for infections with a predominantly sexual mode of transmission: Secondary | ICD-10-CM | POA: Diagnosis not present

## 2022-01-05 DIAGNOSIS — E118 Type 2 diabetes mellitus with unspecified complications: Secondary | ICD-10-CM

## 2022-01-05 DIAGNOSIS — E782 Mixed hyperlipidemia: Secondary | ICD-10-CM

## 2022-01-05 DIAGNOSIS — Z114 Encounter for screening for human immunodeficiency virus [HIV]: Secondary | ICD-10-CM | POA: Diagnosis not present

## 2022-01-05 DIAGNOSIS — I10 Essential (primary) hypertension: Secondary | ICD-10-CM | POA: Diagnosis not present

## 2022-01-05 DIAGNOSIS — N952 Postmenopausal atrophic vaginitis: Secondary | ICD-10-CM

## 2022-01-05 DIAGNOSIS — E042 Nontoxic multinodular goiter: Secondary | ICD-10-CM

## 2022-01-05 LAB — POCT WET PREP (WET MOUNT)
Clue Cells Wet Prep Whiff POC: NEGATIVE
Trichomonas Wet Prep HPF POC: ABSENT
WBC, Wet Prep HPF POC: NONE SEEN

## 2022-01-05 MED ORDER — OLMESARTAN MEDOXOMIL 20 MG PO TABS: 20.0000 mg | ORAL_TABLET | Freq: Every day | ORAL | 3 refills | Status: DC

## 2022-01-05 NOTE — Progress Notes (Signed)
    SUBJECTIVE:   CHIEF COMPLAINT / HPI:   Pap smear: last pap NILM 2017, due again today. Will do routine STI check.  Throat swelling: patient has a h/o thryoid nodules. Last week she had a swelling of her thyroid on both sides that was tender to touch. She was able to swallow and breathe normally, it has since gone back down, but she is concerned about it and would like to be worked up.   HTN: patient has concerns about lisinopril, would like to transition off of it. BP close to goal today.  Vaginal dryness: patient reports that she has had dyspareunia with vaginal dryness that is affecting her sex life with her husband, she does not use lubricant.  DM2: Last A1c in May 2023 elevated to 8.5%. Currently on max metformin at 1000 mg BID. Recommend starting SGLT2 or GLP1, discussed jardiance  HLD: ASCVD risk 33%, recommend increasing atorvastatin to 80 mg.  PERTINENT  PMH / PSH: DM2, chronic pain, chronic opioid use,   OBJECTIVE:   BP 136/82   Pulse 98   Ht '5\' 4"'$  (1.626 m)   Wt 123 lb 4 oz (55.9 kg)   LMP 08/11/2011   SpO2 100%   BMI 21.16 kg/m   Nursing note and vitals reviewed GEN: age-appropriate, AAW, resting comfortably in chair, NAD, thin-appearing HEENT: NCAT. PERRLA. Sclera without injection or icterus. MMM.  Neck: Supple. No LAD. Thyroid is palpable, bilaterally enlarged and nodular, nontender. Cardiac: Regular rate and rhythm. Normal S1/S2. No murmurs, rubs, or gallops appreciated. 2+ radial pulses. Lungs: Clear bilaterally to ascultation. No increased WOB, no accessory muscle usage. No w/r/r. Female genitalia:  Vagina: atrophic, pale, no lesions or discoloration Cervix: normal appearing cervix without discharge or lesions Ext: no edema Psych: Pleasant and appropriate ASSESSMENT/PLAN:   Vaginal atrophy Recommended patient use lubricant for sexual intercourse, also recommend consideration of topical cream such as premarin. She has used this before. Patient will  consider premarin and discuss at next appt.  Multiple thyroid nodules Reviewed chart, patient had thyroid US in March 2019 with bx of left thyroid nodule that showed Bethesda II- benign and no further action required. A right nodule seen in 2018 and 2019 deemed at the time to not meet criteria for biopsy. Patient with recent swelling and tenderness of thyroid. Will check thyroid labs and likely obtain repeat imaging given recent change. Patient previously saw endocrinology in the past, based on results patient can follow up with endocrinology as well. -TSH with reflex fT4 -Thyroid US   Essential hypertension, benign Chronic. D/c lisinopril. Start olmesartan 20 mg. Follow up in 2 weeks to check BP and BMP.  Healthcare maintenance Pap smear performed today. RPR, HIV, wet prep, G/C pending.  Diabetes mellitus type II, controlled ([place]) Current Regimen: metformin '1000mg'$  BID CBGs: does not check Last A1c: 8.4% on 11/2021 Denies polyuria, polydipsia, hypoglycemia Last Eye Exam: annually, reminded for 2023 Statin: atorvastatin ACE/ARB: olmesartan Recommend adding SGLT2i to for renal and cardiac protection. Patient has been on jardiance in the past but could not afford it. Will send to [facility] Rx team to find out cost.    HYPERLIPIDEMIA Elevated ASCVD risk, restart atorvastatin 40 mg- patient had self discontinued at some point.   Gladys Damme, MD [place]

## 2022-01-05 NOTE — Patient Instructions (Signed)
It was a pleasure to see you today!  We will get some labs today.  If they are abnormal or we need to do something about them, I will call you.  If they are normal, I will send you a message on MyChart (if it is active) or a letter in the mail.  If you don't hear from [place] in 2 weeks, please call the office  [phone]. Stop lisinopril, start olmesartan. Follow up in 2 weeks for blood pressure check Increase atorvastatin to 80 mg daily I will investigate the cost of jardiance and farxiga I will look into and help schedule an ultrasound for your thyroid     Be Well,  Dr. Chauncey Reading

## 2022-01-06 LAB — CYTOLOGY - PAP
Adequacy: ABSENT
Comment: NEGATIVE
Diagnosis: NEGATIVE
High risk HPV: NEGATIVE

## 2022-01-06 LAB — HIV ANTIBODY (ROUTINE TESTING W REFLEX): HIV Screen 4th Generation wRfx: NONREACTIVE

## 2022-01-06 LAB — RPR: RPR Ser Ql: NONREACTIVE

## 2022-01-06 LAB — TSH RFX ON ABNORMAL TO FREE T4: TSH: 1.02 u[IU]/mL (ref 0.450–4.500)

## 2022-01-08 NOTE — Assessment & Plan Note (Signed)
Pap smear performed today. RPR, HIV, wet prep, G/C pending.

## 2022-01-08 NOTE — Assessment & Plan Note (Signed)
Current Regimen: metformin '1000mg'$  BID CBGs: does not check Last A1c: 8.4% on 11/2021 Denies polyuria, polydipsia, hypoglycemia Last Eye Exam: annually, reminded for 2023 Statin: atorvastatin ACE/ARB: olmesartan Recommend adding SGLT2i to for renal and cardiac protection. Patient has been on jardiance in the past but could not afford it. Will send to [facility] Rx team to find out cost.

## 2022-01-08 NOTE — Assessment & Plan Note (Signed)
Recommended patient use lubricant for sexual intercourse, also recommend consideration of topical cream such as premarin. She has used this before. Patient will consider premarin and discuss at next appt.

## 2022-01-08 NOTE — Assessment & Plan Note (Addendum)
Reviewed chart, patient had thyroid US in March 2019 with bx of left thyroid nodule that showed Bethesda II- benign and no further action required. A right nodule seen in 2018 and 2019 deemed at the time to not meet criteria for biopsy. Patient with recent swelling and tenderness of thyroid. Will check thyroid labs and likely obtain repeat imaging given recent change. Patient previously saw endocrinology in the past, based on results patient can follow up with endocrinology as well. -TSH with reflex fT4 -Thyroid US

## 2022-01-08 NOTE — Assessment & Plan Note (Signed)
Elevated ASCVD risk, restart atorvastatin 40 mg- patient had self discontinued at some point.

## 2022-01-08 NOTE — Assessment & Plan Note (Signed)
Chronic. D/c lisinopril. Start olmesartan 20 mg. Follow up in 2 weeks to check BP and BMP.

## 2022-01-10 ENCOUNTER — Other Ambulatory Visit (HOSPITAL_COMMUNITY): Payer: Self-pay

## 2022-01-12 ENCOUNTER — Telehealth: Payer: Self-pay

## 2022-01-12 NOTE — Telephone Encounter (Signed)
Left message requesting call back to discuss patient assistance options for medication.  Call back [phone]

## 2022-01-14 ENCOUNTER — Ambulatory Visit (HOSPITAL_COMMUNITY): Payer: Medicare HMO

## 2022-01-17 NOTE — Telephone Encounter (Signed)
Spoke with pt regarding LIS process.  Pt will call [employer] to apply for Low Income Subsidy/Extra Help. Hopefully this will help her jardiance copay. If denied patient will reach back out to me for further assistance.

## 2022-02-02 ENCOUNTER — Other Ambulatory Visit: Payer: Self-pay | Admitting: Family Medicine

## 2022-02-02 DIAGNOSIS — S62632D Displaced fracture of distal phalanx of right middle finger, subsequent encounter for fracture with routine healing: Secondary | ICD-10-CM

## 2022-02-02 DIAGNOSIS — G894 Chronic pain syndrome: Secondary | ICD-10-CM

## 2022-02-02 MED ORDER — OXYCODONE-ACETAMINOPHEN 5-325 MG PO TABS: 1.0000 | ORAL_TABLET | Freq: Two times a day (BID) | ORAL | 0 refills | Status: DC | PRN

## 2022-02-28 ENCOUNTER — Other Ambulatory Visit: Payer: Self-pay

## 2022-02-28 DIAGNOSIS — I10 Essential (primary) hypertension: Secondary | ICD-10-CM

## 2022-03-02 ENCOUNTER — Other Ambulatory Visit: Payer: Self-pay

## 2022-03-02 DIAGNOSIS — I10 Essential (primary) hypertension: Secondary | ICD-10-CM

## 2022-03-02 MED ORDER — HYDROCHLOROTHIAZIDE 25 MG PO TABS: ORAL_TABLET | ORAL | 0 refills | Status: DC

## 2022-03-04 ENCOUNTER — Ambulatory Visit (HOSPITAL_COMMUNITY): Admission: RE | Admit: 2022-03-04 | Payer: Medicare HMO | Source: Ambulatory Visit

## 2022-03-09 ENCOUNTER — Other Ambulatory Visit: Payer: Self-pay | Admitting: Family Medicine

## 2022-03-09 DIAGNOSIS — S62632D Displaced fracture of distal phalanx of right middle finger, subsequent encounter for fracture with routine healing: Secondary | ICD-10-CM

## 2022-03-09 DIAGNOSIS — G894 Chronic pain syndrome: Secondary | ICD-10-CM

## 2022-03-11 MED ORDER — OXYCODONE-ACETAMINOPHEN 5-325 MG PO TABS: 1.0000 | ORAL_TABLET | Freq: Two times a day (BID) | ORAL | 0 refills | Status: DC | PRN
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
## 2022-03-15 NOTE — Telephone Encounter (Signed)
Patient calls nurse line regarding not being able to pick up prescription.   Called pharmacy. They report that prescription had two DEA numbers. Advised that this was a resident DEA and they needed to use both parts.   Once they included both numbers, they were able to process the prescription.   Called patient and provided with update.   Talbot Grumbling, RN

## 2022-03-16 NOTE — Progress Notes (Signed)
    SUBJECTIVE:   CHIEF COMPLAINT / HPI: pain follow-up   Pain - Patient reports current dose of Percocet helps her to be functional at home and outside of her home.  Otherwise her pain stops her from being able to walk and she must take lots of naproxen in order to perform her ADLs.  Feels that current regimen is adequate, understands that it is not going to take away all of her pain.  Diabetes -Patient has been unable to get empagliflozin as co-pays are too high.continues to take metformin metformin.  Reports that she snacks at night, and is not controlling diet. reports fasting sugars are around 87, 82, 123.  Thyroid - Reports still having feeling of throat swelling. No difficulty breathing, eating or drinking.  PAD/Aortic thrombus - Discussed aspirin dose. Patient is taking '325mg'$ .  PERTINENT  PMH / PSH: Multiple thyroid nodules, PAD, HTN, T2DM, chronic pain  OBJECTIVE:   BP 124/71   Pulse 89   Ht '5\' 4"'$  (1.626 m)   Wt 123 lb 6 oz (56 kg)   LMP 08/11/2011   SpO2 100%   BMI 21.18 kg/m   General: NAD  HEENT: Thyroid palpable with swallow, no appreciable thyroid nodules palpated, no obvious swelling or erythema Cardiovascular: RRR, no murmurs, no peripheral edema Respiratory: normal WOB on RA, CTAB, no wheezes, ronchi or rales    ASSESSMENT/PLAN:   PAD (peripheral artery disease) ([place]) Discussed with patient that vascular surgery recommended she decrease her aspirin dose from 325 mg daily to 81 mg daily at prior visit in May. - Aspirin 81 mg sent to pharmacy, aspirin 325 discontinued  Diabetes mellitus type II, controlled ([place]) A1c was improved to 7.2 today from 8.5 four months ago. Fasting sugars at home range from 80s to 120s. Patient unable to get Jardiance due to high copay. Continue current metformin regimen. -Metformin 1000 twice daily -Reattempt Jardiance in around 6 months  Multiple thyroid nodules Patient reports continued feeling of thyroid/neck swelling without  difficulty eating or breathing since last visit.  History of benign thyroid nodules and previously followed with Endocrinology.  Thyroid ultrasound ordered at last visit with Dr. Chauncey Reading, not performed.  -Scheduled thyroid ultrasound -Referral to Endocrinology sent  Chronic pain syndrome Discussed with patient pain plan.  Current regimen of as needed 5 mg tablets of Percocet enable patient to perform ADLs at functional level.  Plan to follow-up on pain in 3 months. - Refilled Percocet prescription    Salvadore Oxford, MD, PGY-1 [place] 11:01 AM 03/17/2022   Orders Placed This Encounter  Procedures   Ambulatory referral to Endocrinology    Referral Priority:   Routine    Referral Type:   Consultation    Referral Reason:   Specialty Services Required    Number of Visits Requested:   1   POCT A1C

## 2022-03-17 ENCOUNTER — Encounter: Payer: Self-pay | Admitting: Family Medicine

## 2022-03-17 ENCOUNTER — Ambulatory Visit (INDEPENDENT_AMBULATORY_CARE_PROVIDER_SITE_OTHER): Payer: Medicare HMO | Admitting: Family Medicine

## 2022-03-17 VITALS — BP 124/71 | HR 89 | Ht 64.0 in | Wt 123.4 lb

## 2022-03-17 DIAGNOSIS — I739 Peripheral vascular disease, unspecified: Secondary | ICD-10-CM | POA: Diagnosis not present

## 2022-03-17 DIAGNOSIS — E118 Type 2 diabetes mellitus with unspecified complications: Secondary | ICD-10-CM

## 2022-03-17 DIAGNOSIS — E042 Nontoxic multinodular goiter: Secondary | ICD-10-CM

## 2022-03-17 DIAGNOSIS — G894 Chronic pain syndrome: Secondary | ICD-10-CM | POA: Diagnosis not present

## 2022-03-17 DIAGNOSIS — S62632D Displaced fracture of distal phalanx of right middle finger, subsequent encounter for fracture with routine healing: Secondary | ICD-10-CM

## 2022-03-17 LAB — POCT GLYCOSYLATED HEMOGLOBIN (HGB A1C): HbA1c, POC (controlled diabetic range): 7.2 % — AB (ref 0.0–7.0)

## 2022-03-17 MED ORDER — ASPIRIN 81 MG PO TBEC: 81.0000 mg | DELAYED_RELEASE_TABLET | Freq: Every day | ORAL | 12 refills | Status: DC

## 2022-03-17 MED ORDER — OXYCODONE-ACETAMINOPHEN 5-325 MG PO TABS: 1.0000 | ORAL_TABLET | Freq: Two times a day (BID) | ORAL | 0 refills | Status: DC | PRN

## 2022-03-17 NOTE — Patient Instructions (Addendum)
It was great to see you! Thank you for allowing me to participate in your care!  I recommend that you always bring your medications to each appointment as this makes it easy to ensure we are on the correct medications and helps [place] not miss when refills are needed.  Our plans for today:  - We checked your A1c for diabetes which was 7.2 this is an improvement from 8 last time. Continue taking your metformin. - Chronic pain, I have refilled your Percocet prescription so you are able to function regularly at home. - I have sent a referral to endocrinology for follow-up of your thyroid nodules. They should call you to schedule an appointment, but here is the phone number [phone]. Your thyroid ultrasound should be scheduled.   Take care and seek immediate care sooner if you develop any concerns.   Dr. Salvadore Oxford, MD [place]

## 2022-03-17 NOTE — Assessment & Plan Note (Signed)
Discussed with patient pain plan.  Current regimen of as needed 5 mg tablets of Percocet enable patient to perform ADLs at functional level.  Plan to follow-up on pain in 3 months. - Refilled Percocet prescription

## 2022-03-17 NOTE — Assessment & Plan Note (Signed)
Patient reports continued feeling of thyroid/neck swelling without difficulty eating or breathing since last visit.  History of benign thyroid nodules and previously followed with Endocrinology.  Thyroid ultrasound ordered at last visit with Dr. Chauncey Reading, not performed.  -Scheduled thyroid ultrasound -Referral to Endocrinology sent

## 2022-03-17 NOTE — Assessment & Plan Note (Signed)
Discussed with patient that vascular surgery recommended she decrease her aspirin dose from 325 mg daily to 81 mg daily at prior visit in May. - Aspirin 81 mg sent to pharmacy, aspirin 325 discontinued

## 2022-03-17 NOTE — Assessment & Plan Note (Signed)
A1c was improved to 7.2 today from 8.5 four months ago. Fasting sugars at home range from 80s to 120s. Patient unable to get Jardiance due to high copay. Continue current metformin regimen. -Metformin 1000 twice daily -Reattempt Jardiance in around 6 months

## 2022-03-21 ENCOUNTER — Ambulatory Visit (HOSPITAL_COMMUNITY): Admission: RE | Admit: 2022-03-21 | Payer: Medicare HMO | Source: Ambulatory Visit

## 2022-03-22 ENCOUNTER — Ambulatory Visit (HOSPITAL_COMMUNITY)
Admission: RE | Admit: 2022-03-22 | Discharge: 2022-03-22 | Disposition: A | Discharge status: Home / Self Care | Payer: Medicare HMO | Source: Ambulatory Visit | Attending: Family Medicine | Admitting: Family Medicine

## 2022-03-22 DIAGNOSIS — E042 Nontoxic multinodular goiter: Secondary | ICD-10-CM | POA: Diagnosis present

## 2022-03-25 ENCOUNTER — Other Ambulatory Visit: Payer: Self-pay | Admitting: Family Medicine

## 2022-03-25 DIAGNOSIS — I10 Essential (primary) hypertension: Secondary | ICD-10-CM

## 2022-04-17 IMAGING — CR DG FINGER MIDDLE 2+V*R*
3 series · 3 of 3 positions shown · non-contrast
Comparison: None.

CLINICAL DATA: Finger injury

EXAM:
RIGHT MIDDLE FINGER 2+V

[finger ap]
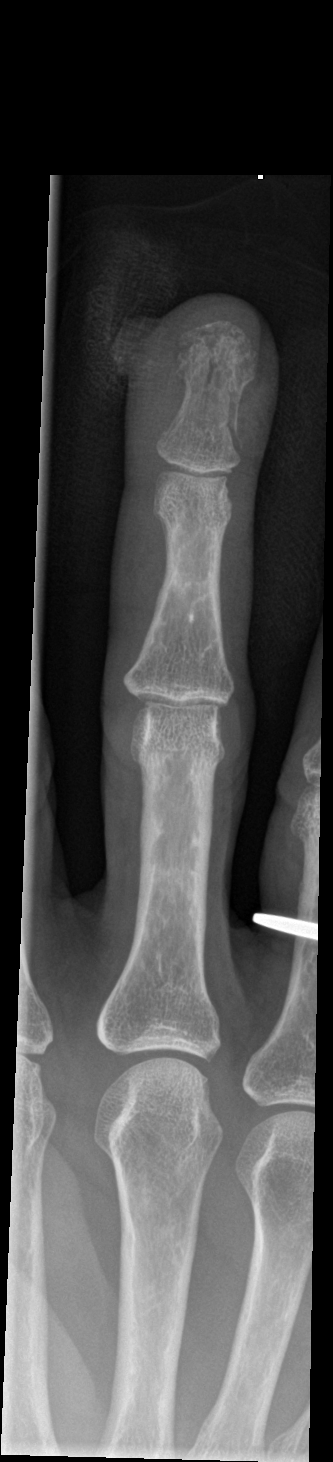

[finger obl]
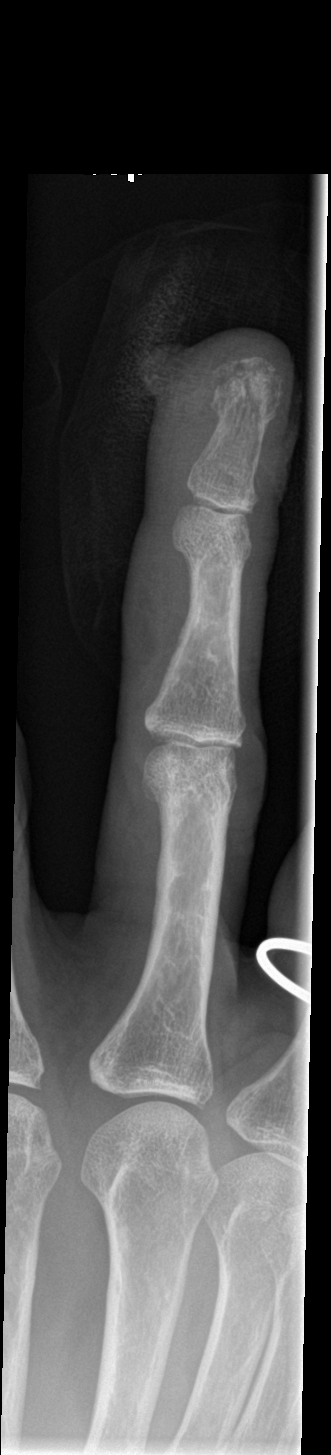

[finger lat]
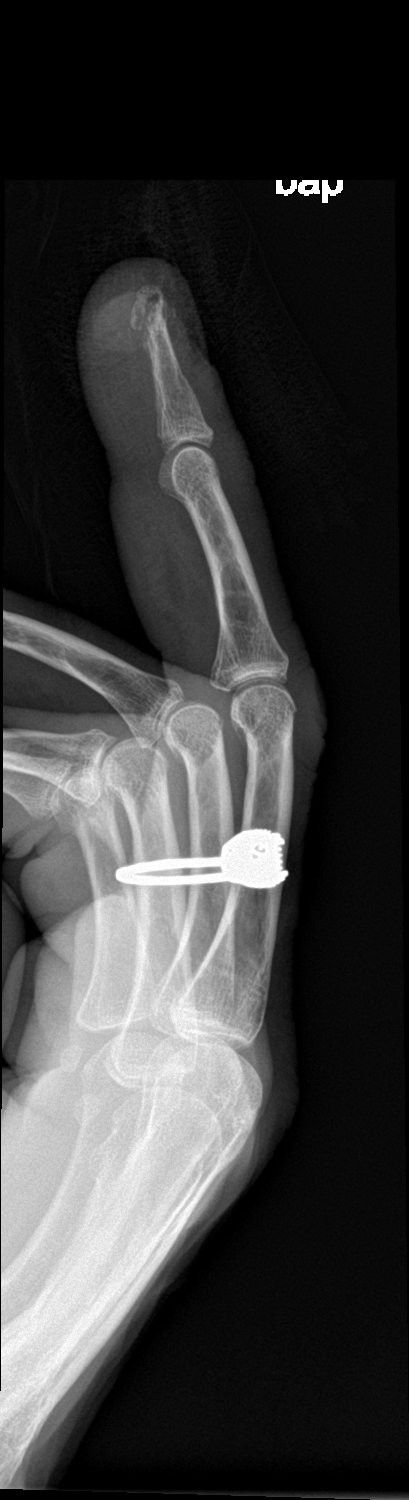

[3 of 3 positions shown; findings below may reference images not displayed]

FINDINGS: There is comminuted mildly displaced distal tuft fracture of the
middle digit. Overlying soft tissue swelling is seen.
IMPRESSION: Comminuted mildly displaced distal tuft fracture.

## 2022-04-18 ENCOUNTER — Other Ambulatory Visit: Payer: Self-pay

## 2022-04-18 DIAGNOSIS — S62632D Displaced fracture of distal phalanx of right middle finger, subsequent encounter for fracture with routine healing: Secondary | ICD-10-CM

## 2022-04-18 DIAGNOSIS — I10 Essential (primary) hypertension: Secondary | ICD-10-CM

## 2022-04-18 DIAGNOSIS — G894 Chronic pain syndrome: Secondary | ICD-10-CM

## 2022-04-18 MED ORDER — HYDROCHLOROTHIAZIDE 25 MG PO TABS: ORAL_TABLET | ORAL | 0 refills | Status: DC

## 2022-04-18 MED ORDER — OXYCODONE-ACETAMINOPHEN 5-325 MG PO TABS: 1.0000 | ORAL_TABLET | Freq: Two times a day (BID) | ORAL | 0 refills | Status: DC | PRN

## 2022-04-18 MED ORDER — METFORMIN HCL 1000 MG PO TABS: 1000.0000 mg | ORAL_TABLET | Freq: Two times a day (BID) | ORAL | 0 refills | Status: DC

## 2022-05-09 ENCOUNTER — Other Ambulatory Visit: Payer: Self-pay | Admitting: Family Medicine

## 2022-05-09 DIAGNOSIS — I10 Essential (primary) hypertension: Secondary | ICD-10-CM

## 2022-05-12 ENCOUNTER — Ambulatory Visit
Admission: RE | Admit: 2022-05-12 | Discharge: 2022-05-12 | Disposition: A | Discharge status: Home / Self Care | Payer: Medicare HMO | Source: Ambulatory Visit | Attending: Family Medicine | Admitting: Family Medicine

## 2022-05-12 DIAGNOSIS — Z13820 Encounter for screening for osteoporosis: Secondary | ICD-10-CM

## 2022-05-12 DIAGNOSIS — Z0000 Encounter for general adult medical examination without abnormal findings: Secondary | ICD-10-CM

## 2022-05-16 ENCOUNTER — Other Ambulatory Visit: Payer: Self-pay

## 2022-05-16 ENCOUNTER — Telehealth: Payer: Self-pay

## 2022-05-16 DIAGNOSIS — S62632D Displaced fracture of distal phalanx of right middle finger, subsequent encounter for fracture with routine healing: Secondary | ICD-10-CM

## 2022-05-16 DIAGNOSIS — E042 Nontoxic multinodular goiter: Secondary | ICD-10-CM

## 2022-05-16 DIAGNOSIS — I10 Essential (primary) hypertension: Secondary | ICD-10-CM

## 2022-05-16 DIAGNOSIS — G894 Chronic pain syndrome: Secondary | ICD-10-CM

## 2022-05-16 MED ORDER — HYDROCHLOROTHIAZIDE 25 MG PO TABS: ORAL_TABLET | ORAL | 0 refills | Status: DC

## 2022-05-16 MED ORDER — OXYCODONE-ACETAMINOPHEN 5-325 MG PO TABS: 1.0000 | ORAL_TABLET | Freq: Two times a day (BID) | ORAL | 0 refills | Status: DC | PRN

## 2022-05-16 NOTE — Telephone Encounter (Signed)
Called patient regarding recent call to clinic. Unable to reach them. Left HIPAA-compliant VM that I would like to discuss results with them over the phone. 1st attempt.

## 2022-05-16 NOTE — Telephone Encounter (Signed)
Patient calls nurse line regarding questions about endocrinology referral. She states that she was contacted in regards to scheduling this appointment, however, she was unsure of the reason. Advised that per last office visit note on 9/7, referral was placed due to her having continued neck swelling and history of thyroid nodules.   Patient states that she never heard back regarding results of thyroid ultrasound. She is requesting returned call from provider to discuss.   Please return call to patient at [phone].  Talbot Grumbling, RN

## 2022-05-17 NOTE — Telephone Encounter (Signed)
Called patient regarding recent call to clinic about thyroid ultrasound results.  Thyroid ultrasound results went to Dr. Chauncey Reading rather than me, her PCP, because Dr. Chauncey Reading had previously ordered this ultrasound.  Explained to patient that results of thyroid ultrasound showed stable nodules from previous, but would best be evaluated by endocrinology.  I will replace referral to endocrinology as patient does not have callback number or name of clinic that called her from previous.  Discussed that patient's DEXA scan was normal T score 0.2.  Patient still endorses intermittent hoarseness and throat swelling consistent with complaint from last visit with me.  Discussed return precautions if difficulty breathing, or to schedule an appointment if symptoms do not get better or start to get worse.  Salvadore Oxford, MD, PGY-1 [facility] 9:08 AM 05/17/2022

## 2022-05-17 NOTE — Addendum Note (Signed)
Addended by: Salvadore Oxford on: 05/17/2022 09:08 AM   Modules accepted: Orders

## 2022-06-09 ENCOUNTER — Other Ambulatory Visit: Payer: Self-pay | Admitting: Family Medicine

## 2022-06-14 ENCOUNTER — Other Ambulatory Visit: Payer: Self-pay

## 2022-06-14 DIAGNOSIS — E1165 Type 2 diabetes mellitus with hyperglycemia: Secondary | ICD-10-CM

## 2022-06-14 DIAGNOSIS — G894 Chronic pain syndrome: Secondary | ICD-10-CM

## 2022-06-14 DIAGNOSIS — S62632D Displaced fracture of distal phalanx of right middle finger, subsequent encounter for fracture with routine healing: Secondary | ICD-10-CM

## 2022-06-14 MED ORDER — EMPAGLIFLOZIN 10 MG PO TABS: 10.0000 mg | ORAL_TABLET | Freq: Every day | ORAL | 3 refills | Status: DC

## 2022-06-14 MED ORDER — METFORMIN HCL 1000 MG PO TABS: 1000.0000 mg | ORAL_TABLET | Freq: Two times a day (BID) | ORAL | 0 refills | Status: DC

## 2022-06-14 MED ORDER — OXYCODONE-ACETAMINOPHEN 5-325 MG PO TABS: 1.0000 | ORAL_TABLET | Freq: Two times a day (BID) | ORAL | 0 refills | Status: DC | PRN

## 2022-07-07 ENCOUNTER — Other Ambulatory Visit: Payer: Self-pay | Admitting: Family Medicine

## 2022-07-07 DIAGNOSIS — I10 Essential (primary) hypertension: Secondary | ICD-10-CM

## 2022-07-07 NOTE — Telephone Encounter (Signed)
Prior refill on 05/16/2022 by Dr. Ruben Im notes appointment needs prior to future refill. Will refill HCTZ #30 to bridge until next appointment.

## 2022-07-08 NOTE — Telephone Encounter (Signed)
Called and scheduled patient

## 2022-07-13 ENCOUNTER — Encounter: Payer: Self-pay | Admitting: Family Medicine

## 2022-07-13 ENCOUNTER — Ambulatory Visit: Payer: Medicare HMO | Admitting: Family Medicine

## 2022-07-13 VITALS — BP 140/75 | HR 83 | Ht 64.0 in | Wt 130.1 lb

## 2022-07-13 DIAGNOSIS — Z0000 Encounter for general adult medical examination without abnormal findings: Secondary | ICD-10-CM

## 2022-07-13 DIAGNOSIS — E118 Type 2 diabetes mellitus with unspecified complications: Secondary | ICD-10-CM

## 2022-07-13 DIAGNOSIS — J309 Allergic rhinitis, unspecified: Secondary | ICD-10-CM

## 2022-07-13 DIAGNOSIS — Z23 Encounter for immunization: Secondary | ICD-10-CM

## 2022-07-13 DIAGNOSIS — G894 Chronic pain syndrome: Secondary | ICD-10-CM

## 2022-07-13 DIAGNOSIS — I10 Essential (primary) hypertension: Secondary | ICD-10-CM

## 2022-07-13 LAB — POCT GLYCOSYLATED HEMOGLOBIN (HGB A1C): HbA1c, POC (controlled diabetic range): 7.6 % — AB (ref 0.0–7.0)

## 2022-07-13 MED ORDER — HYDROCHLOROTHIAZIDE 25 MG PO TABS: ORAL_TABLET | ORAL | 0 refills | Status: DC

## 2022-07-13 MED ORDER — OXYCODONE-ACETAMINOPHEN 5-325 MG PO TABS: 1.0000 | ORAL_TABLET | Freq: Two times a day (BID) | ORAL | 0 refills | Status: DC | PRN

## 2022-07-13 MED ORDER — SHINGRIX 50 MCG/0.5ML IM SUSR: 0.5000 mL | INTRAMUSCULAR | 0 refills | Status: AC

## 2022-07-13 MED ORDER — OLMESARTAN MEDOXOMIL 20 MG PO TABS: 20.0000 mg | ORAL_TABLET | Freq: Every day | ORAL | 3 refills | Status: DC

## 2022-07-13 MED ORDER — FLUTICASONE PROPIONATE 50 MCG/ACT NA SUSP: 2.0000 | Freq: Every day | NASAL | 6 refills | Status: DC

## 2022-07-13 NOTE — Progress Notes (Signed)
    SUBJECTIVE:   CHIEF COMPLAINT / HPI: bp follow-up  HTN - HCTZ '25mg'$ , Olmesartan '20mg'$ . Needs refills. Reports 126 over 79 on Saturday. Normally 120s/70s. No chest pain or shortness of breath. Patient forgot to bring in booklet today.   DM2 - Metformin '1000mg'$  BID, Jardiance was able to get money for it for 3 months, and started taking last month. Sugars at home in morning yesterday was 136, usually 90s-100s. Last A1c 7.2.  HM - DEXA scan normal. Vaccines, would like to get Flu shot. Wants to wait on second dose of Shingrix.  PERTINENT  PMH / PSH: PAD, HTN, T2DM, Chronic Pain  OBJECTIVE:   BP (!) 140/75   Pulse 83   Ht '5\' 4"'$  (1.626 m)   Wt 130 lb 2 oz (59 kg)   LMP 08/11/2011   SpO2 99%   BMI 22.34 kg/m   General: NAD, walking with cane Cardiovascular: RRR, no murmurs, no peripheral edema Respiratory: normal WOB on RA, CTAB, no wheezes, ronchi or rales Abdomen: soft, NTTP, no rebound or guarding Extremities: Moving all 4 extremities equally   ASSESSMENT/PLAN:   Diabetes mellitus type II, controlled ([facility]) A1c today 7.6 from prior of 7.2.  Patient taking 1000 mg metformin twice daily, and recently able to get Jardiance and started 1 month ago. -Continue metformin 1000 mg twice daily -Continue Jardiance 10 mg daily -Follow-up 3 months -Foot exam next appt -Retry Jardiance with insurance next appt  Need for zoster vaccination Prescription printed for Shingrix vaccine given to patient.  Essential hypertension, benign Controlled. Blood pressure elevated today in clinic 146/78.  Patient states that her BP normally is 120s over 70s, but forgot to bring in her booklet today. -Refill Benicar 20 mg daily -Refill hydrochlorothiazide 25 mg daily -Instructed to bring in booklet at next visit. -Follow-up 3 months -BMP today  Healthcare maintenance DEXA scan normal.  Chronic pain syndrome Reviewed PDMP. Pain is stable on current regimen. -Refilled Percocet 1 tab twice daily  PRN   Need for immunization against influenza -Flu Vaccine administered  Salvadore Oxford, MD [place]

## 2022-07-13 NOTE — Assessment & Plan Note (Signed)
Prescription printed for Shingrix vaccine given to patient.

## 2022-07-13 NOTE — Assessment & Plan Note (Addendum)
A1c today 7.6 from prior of 7.2.  Patient taking 1000 mg metformin twice daily, and recently able to get Jardiance and started 1 month ago. -Continue metformin 1000 mg twice daily -Continue Jardiance 10 mg daily -Follow-up 3 months -Foot exam next appt -Retry Jardiance with insurance next appt

## 2022-07-13 NOTE — Assessment & Plan Note (Signed)
DEXA scan normal.

## 2022-07-13 NOTE — Assessment & Plan Note (Addendum)
Reviewed PDMP. Pain is stable on current regimen. -Refilled Percocet 1 tab twice daily PRN

## 2022-07-13 NOTE — Assessment & Plan Note (Addendum)
Controlled. Blood pressure elevated today in clinic 146/78.  Patient states that her BP normally is 120s over 70s, but forgot to bring in her booklet today. -Refill Benicar 20 mg daily -Refill hydrochlorothiazide 25 mg daily -Instructed to bring in booklet at next visit. -Follow-up 3 months -BMP today

## 2022-07-13 NOTE — Patient Instructions (Addendum)
It was great to see you! Thank you for allowing me to participate in your care!  I recommend that you always bring your medications to each appointment as this makes it easy to ensure we are on the correct medications and helps [place] not miss when refills are needed.  Our plans for today:  -I have refilled your blood pressure medications as well as your pain medication. -I printed a prescription for your Shingrix vaccine you may take this to any [facility] or [facility] to have it filled. -Please follow-up in 3 months.  We are checking some labs today, I will call you if they are abnormal will send you a MyChart message or a letter if they are normal.  If you do not hear about your labs in the next 2 weeks please let us know.  Take care and seek immediate care sooner if you develop any concerns.   Dr. Salvadore Oxford, MD [place]

## 2022-07-14 LAB — BASIC METABOLIC PANEL
BUN/Creatinine Ratio: 20 (ref 12–28)
BUN: 19 mg/dL (ref 8–27)
CO2: 23 mmol/L (ref 20–29)
Calcium: 9.9 mg/dL (ref 8.7–10.3)
Chloride: 103 mmol/L (ref 96–106)
Creatinine, Ser: 0.95 mg/dL (ref 0.57–1.00)
Glucose: 139 mg/dL — ABNORMAL HIGH (ref 70–99)
Potassium: 4.1 mmol/L (ref 3.5–5.2)
Sodium: 141 mmol/L (ref 134–144)
eGFR: 66 mL/min/{1.73_m2} (ref >59)

## 2022-07-28 ENCOUNTER — Other Ambulatory Visit: Payer: Self-pay | Admitting: Family Medicine

## 2022-07-28 DIAGNOSIS — I10 Essential (primary) hypertension: Secondary | ICD-10-CM

## 2022-08-07 ENCOUNTER — Other Ambulatory Visit: Payer: Self-pay | Admitting: Family Medicine

## 2022-08-15 ENCOUNTER — Other Ambulatory Visit: Payer: Self-pay

## 2022-08-15 DIAGNOSIS — E1140 Type 2 diabetes mellitus with diabetic neuropathy, unspecified: Secondary | ICD-10-CM

## 2022-08-15 MED ORDER — DROPSAFE ALCOHOL PREP 70 % PADS: MEDICATED_PAD | 2 refills | Status: DC

## 2022-08-15 MED ORDER — GLUCOSE BLOOD VI STRP: ORAL_STRIP | 12 refills | Status: DC

## 2022-08-19 ENCOUNTER — Other Ambulatory Visit: Payer: Self-pay

## 2022-08-19 DIAGNOSIS — G894 Chronic pain syndrome: Secondary | ICD-10-CM

## 2022-08-20 ENCOUNTER — Other Ambulatory Visit: Payer: Self-pay | Admitting: Family Medicine

## 2022-08-20 DIAGNOSIS — I10 Essential (primary) hypertension: Secondary | ICD-10-CM

## 2022-08-22 MED ORDER — OXYCODONE-ACETAMINOPHEN 5-325 MG PO TABS: 1.0000 | ORAL_TABLET | Freq: Two times a day (BID) | ORAL | 0 refills | Status: DC | PRN

## 2022-08-23 ENCOUNTER — Other Ambulatory Visit: Payer: Self-pay

## 2022-08-23 MED ORDER — UNISTIK 2 NORMAL MISC: 1 refills | Status: DC

## 2022-08-29 ENCOUNTER — Other Ambulatory Visit: Payer: Self-pay

## 2022-08-29 DIAGNOSIS — E782 Mixed hyperlipidemia: Secondary | ICD-10-CM

## 2022-08-29 MED ORDER — ATORVASTATIN CALCIUM 80 MG PO TABS: 80.0000 mg | ORAL_TABLET | Freq: Every day | ORAL | 3 refills | Status: DC

## 2022-08-31 ENCOUNTER — Other Ambulatory Visit (HOSPITAL_COMMUNITY): Payer: Self-pay

## 2022-09-21 ENCOUNTER — Other Ambulatory Visit: Payer: Self-pay

## 2022-09-21 DIAGNOSIS — G894 Chronic pain syndrome: Secondary | ICD-10-CM

## 2022-09-21 MED ORDER — OXYCODONE-ACETAMINOPHEN 5-325 MG PO TABS: 1.0000 | ORAL_TABLET | Freq: Two times a day (BID) | ORAL | 0 refills | Status: DC | PRN

## 2022-10-16 ENCOUNTER — Other Ambulatory Visit: Payer: Self-pay | Admitting: Family Medicine

## 2022-10-24 ENCOUNTER — Other Ambulatory Visit: Payer: Self-pay

## 2022-10-24 DIAGNOSIS — G894 Chronic pain syndrome: Secondary | ICD-10-CM

## 2022-10-31 MED ORDER — OXYCODONE-ACETAMINOPHEN 5-325 MG PO TABS: 1.0000 | ORAL_TABLET | Freq: Two times a day (BID) | ORAL | 0 refills | Status: DC | PRN

## 2022-10-31 NOTE — Telephone Encounter (Signed)
Patient calls nurse line upset medication has not been sent in.   She reports she has an apt tomorrow with PCP.   Will forward to PCP.   She does want this to go to [facility] mail order.

## 2022-11-01 ENCOUNTER — Ambulatory Visit (INDEPENDENT_AMBULATORY_CARE_PROVIDER_SITE_OTHER): Payer: Medicare HMO | Admitting: Family Medicine

## 2022-11-01 ENCOUNTER — Encounter: Payer: Self-pay | Admitting: Family Medicine

## 2022-11-01 VITALS — BP 125/78 | HR 101 | Ht 64.0 in | Wt 119.2 lb

## 2022-11-01 DIAGNOSIS — E042 Nontoxic multinodular goiter: Secondary | ICD-10-CM

## 2022-11-01 DIAGNOSIS — E118 Type 2 diabetes mellitus with unspecified complications: Secondary | ICD-10-CM | POA: Diagnosis not present

## 2022-11-01 DIAGNOSIS — R42 Dizziness and giddiness: Secondary | ICD-10-CM | POA: Diagnosis not present

## 2022-11-01 DIAGNOSIS — G894 Chronic pain syndrome: Secondary | ICD-10-CM

## 2022-11-01 DIAGNOSIS — I10 Essential (primary) hypertension: Secondary | ICD-10-CM

## 2022-11-01 LAB — POCT GLYCOSYLATED HEMOGLOBIN (HGB A1C): HbA1c, POC (controlled diabetic range): 7.7 % — AB (ref 0.0–7.0)

## 2022-11-01 MED ORDER — OXYCODONE-ACETAMINOPHEN 5-325 MG PO TABS: 1.0000 | ORAL_TABLET | Freq: Two times a day (BID) | ORAL | 0 refills | Status: DC | PRN

## 2022-11-01 NOTE — Assessment & Plan Note (Addendum)
Broad differential (BPPV, vasovagal, orthostatics, arrhythmia, aortic stenosis, CNS process, thyroid abnormality, anemia). Clinically this is likely orthostatic versus vasovagal. However, orthostatic vitals within normal limits today. Will evaluate for anemia and Thyroid given patient's history of thyroid nodules. Current clinical evidence is low for CNS or cardiac cause (reassuring neuro exam, no cardiac symptoms). Patient is mildly tachycardic today; however, documented history of this prior to onset of lightlessness. Consider EKG.

## 2022-11-01 NOTE — Progress Notes (Signed)
    SUBJECTIVE:   CHIEF COMPLAINT / HPI: pain medication refill  Pain - Upset that she did not get prescription earlier.  Dizziness - Ongoing 1 month. BP occasionally 113/71. Normally 120s/80s. Not necessarily low with every episode of dizziness. Worse with bending over or standing up. Feels she is staying hydrated. Denies heart beating fast, pain, SOB. No new headaches. No headaches or vision. No falls. Not worse with head moving left. No hearing changes.   Diabetes - Last A1c 7.6, Metformin. Checking sugars. States for the most part is good. Knows she needs to get an eye doctor appointment. Feels she has new floater. No other vision changes.   Thyroid - Has appointment in September.  PERTINENT  PMH / PSH: T2DM, PAD, HTN  OBJECTIVE:   BP 125/78   Pulse (!) 101   Ht  (1.626 m)   Wt 54.1 kg   LMP 08/11/2011   SpO2 100%   BMI 20.46 kg/m   General: NAD, walking with cane HEENT: thyroid not palpable Neuro: PERRL, EOMI intact, finger to nose test negative, negative [place] Cardiovascular: RRR Respiratory: normal WOB on RA Extremities: Moving all 4 extremities equally Foot exam: No deformities, ulcerations, or other skin breakdown on feet bilaterally.  Sensation intact to light touch, bilateral toe numbness.  PT and DP pulses intact BL.    Orthostatic Vitals - Sitting 128/83, Laying 129/80, Standing 132/89  ASSESSMENT/PLAN:   Orthostatic lightheadedness Assessment & Plan: Broad differential (BPPV, vasovagal, orthostatics, arrhythmia, aortic stenosis, CNS process, thyroid abnormality, anemia). Clinically this is likely orthostatic versus vasovagal. However, orthostatic vitals within normal limits today. Will evaluate for anemia and Thyroid given patient's history of thyroid nodules. Current clinical evidence is low for CNS or cardiac cause (reassuring neuro exam, no cardiac symptoms). Patient is mildly tachycardic today; however, documented history of this prior to onset of  lightlessness. Consider EKG.   Orders: -     TSH Rfx on Abnormal to Free T4 -     CBC  Controlled type 2 diabetes mellitus with complication, without long-term current use of insulin Assessment & Plan: Previous A1c 7.6. Today 7.7. At goal. Continue Metformin and Jardiance. Diabetic Foot Exam performed today.  Orders: -     POCT glycosylated hemoglobin (Hb A1C) -     Ambulatory referral to Ophthalmology  Chronic pain syndrome -     oxyCODONE-Acetaminophen; Take 1 tablet by mouth 2 (two) times daily as needed for severe pain. Interim until mail prescription arrives.  Dispense: 14 tablet; Refill: 0  Multiple thyroid nodules Assessment & Plan: Endocrinology follow-up appointment made for 03/22/23.   Essential hypertension, benign Assessment & Plan: Controlled. Continue current regimen of Benicar and HCTZ.    Return in about 2 weeks (around 11/15/2022).  Celine Mans, MD [facility]

## 2022-11-01 NOTE — Assessment & Plan Note (Signed)
Refilled Percocet prescription yesterday. Patient indicate will not arrive in time as she receives medications via mail pharmacy. Sent 1 week supply to [facility] pharmacy to bridge patient.

## 2022-11-01 NOTE — Assessment & Plan Note (Addendum)
Controlled. Continue current regimen of Benicar and HCTZ.

## 2022-11-01 NOTE — Assessment & Plan Note (Signed)
Endocrinology follow-up appointment made for 03/22/23.

## 2022-11-01 NOTE — Patient Instructions (Addendum)
It was great to see you! Thank you for allowing me to participate in your care!  I recommend that you always bring your medications to each appointment as this makes it easy to ensure we are on the correct medications and helps [place] not miss when refills are needed.  Our plans for today:  - I have sent a short Percocet prescription to your pharmacy. - I have made a referral to [facility] for your [facility]. - Please go to your endocrine appointment in September. - We are checking some labs today, I will let you know if they are abnormal.   Please arrive 15 minutes PRIOR to your next scheduled appointment time! If you do not, this affects OTHER patients' care.  Take care and seek immediate care sooner if you develop any concerns.   Dr. Celine Mans, MD [facility]

## 2022-11-01 NOTE — Assessment & Plan Note (Addendum)
Previous A1c 7.6. Today 7.7. At goal. Continue Metformin and Jardiance. Diabetic Foot Exam performed today.

## 2022-11-02 LAB — CBC
Hematocrit: 36.5 % (ref 34.0–46.6)
Hemoglobin: 12.2 g/dL (ref 11.1–15.9)
MCH: 29.3 pg (ref 26.6–33.0)
MCHC: 33.4 g/dL (ref 31.5–35.7)
MCV: 88 fL (ref 79–97)
Platelets: 363 10*3/uL (ref 150–450)
RBC: 4.16 x10E6/uL (ref 3.77–5.28)
RDW: 12.9 % (ref 11.7–15.4)
WBC: 10 10*3/uL (ref 3.4–10.8)

## 2022-11-02 LAB — T4F: T4,Free (Direct): 1.55 ng/dL (ref 0.82–1.77)

## 2022-11-02 LAB — TSH RFX ON ABNORMAL TO FREE T4: TSH: 0.44 u[IU]/mL — ABNORMAL LOW (ref 0.450–4.500)

## 2022-11-05 ENCOUNTER — Other Ambulatory Visit: Payer: Self-pay | Admitting: Family Medicine

## 2022-11-05 DIAGNOSIS — I10 Essential (primary) hypertension: Secondary | ICD-10-CM

## 2022-11-24 ENCOUNTER — Telehealth: Payer: Self-pay | Admitting: Family Medicine

## 2022-11-24 NOTE — Telephone Encounter (Signed)
Contacted Dorrine Payne-Robinson to schedule their annual wellness visit. Appointment made for 11/25/2022.  Thank you,  [facility] Support [facility] Direct dial  [phone]

## 2022-11-25 ENCOUNTER — Ambulatory Visit (INDEPENDENT_AMBULATORY_CARE_PROVIDER_SITE_OTHER): Payer: Medicare HMO

## 2022-11-25 VITALS — Ht 64.0 in | Wt 119.0 lb

## 2022-11-25 DIAGNOSIS — Z1231 Encounter for screening mammogram for malignant neoplasm of breast: Secondary | ICD-10-CM

## 2022-11-25 DIAGNOSIS — Z0000 Encounter for general adult medical examination without abnormal findings: Secondary | ICD-10-CM | POA: Diagnosis not present

## 2022-11-26 NOTE — Patient Instructions (Signed)
Robin [name] , Thank you for taking time to come for your Medicare Wellness Visit. I appreciate your ongoing commitment to your health goals. Please review the following plan we discussed and let me know if I can assist you in the future.   These are the goals we discussed:  Goals      Remain active and independent        This is a list of the screening recommended for you and due dates:  Health Maintenance  Topic Date Due   Zoster (Shingles) Vaccine (1 of 2) Never done   Eye exam for diabetics  07/14/2021   COVID-19 Vaccine (3 - 2023-24 season) 03/11/2022   Yearly kidney health urinalysis for diabetes  11/25/2022   Lipid (cholesterol) test  11/25/2022   Mammogram  11/27/2022   DTaP/Tdap/Td vaccine (3 - Td or Tdap) 01/19/2023   Hemoglobin A1C  01/31/2023   Flu Shot  02/09/2023   Yearly kidney function blood test for diabetes  07/14/2023   Complete foot exam   11/01/2023   Medicare Annual Wellness Visit  11/25/2023   Colon Cancer Screening  01/24/2027   Pneumonia Vaccine  Completed   DEXA scan (bone density measurement)  Completed   Hepatitis C Screening: USPSTF Recommendation to screen - Ages 32-79 yo.  Completed   HPV Vaccine  Aged Out    Advanced directives: Information on Advanced Care Planning can be found at [facility] Advance Health Care Directives Advance Health Care Directives ([email or web address]    Conditions/risks identified: Aim for 30 minutes of exercise or brisk walking, 6-8 glasses of water, and 5 servings of fruits and vegetables each day.   Next appointment: Follow up in one year for your annual wellness visit   The number to schedule your mammogram at [facility] is [phone]    Preventive Care 65 Years and Older, Female Preventive care refers to lifestyle choices and visits with your health care provider that can promote health and wellness. What does preventive care include? A yearly physical exam. This is also called an  annual well check. Dental exams once or twice a year. Routine eye exams. Ask your health care provider how often you should have your eyes checked. Personal lifestyle choices, including: Daily care of your teeth and gums. Regular physical activity. Eating a healthy diet. Avoiding tobacco and drug use. Limiting alcohol use. Practicing safe sex. Taking low-dose aspirin every day. Taking vitamin and mineral supplements as recommended by your health care provider. What happens during an annual well check? The services and screenings done by your health care provider during your annual well check will depend on your age, overall health, lifestyle risk factors, and family history of disease. Counseling  Your health care provider may ask you questions about your: Alcohol use. Tobacco use. Drug use. Emotional well-being. Home and relationship well-being. Sexual activity. Eating habits. History of falls. Memory and ability to understand (cognition). Work and work Astronomer. Reproductive health. Screening  You may have the following tests or measurements: Height, weight, and BMI. Blood pressure. Lipid and cholesterol levels. These may be checked every 5 years, or more frequently if you are over 61 years old. Skin check. Lung cancer screening. You may have this screening every year starting at age 4 if you have a 30-pack-year history of smoking and currently smoke or have quit within the past 15 years. Fecal occult blood test (FOBT) of the stool. You may have this test every year starting at age  50. Flexible sigmoidoscopy or colonoscopy. You may have a sigmoidoscopy every 5 years or a colonoscopy every 10 years starting at age 55. Hepatitis C blood test. Hepatitis B blood test. Sexually transmitted disease (STD) testing. Diabetes screening. This is done by checking your blood sugar (glucose) after you have not eaten for a while (fasting). You may have this done every 1-3 years. Bone  density scan. This is done to screen for osteoporosis. You may have this done starting at age 78. Mammogram. This may be done every 1-2 years. Talk to your health care provider about how often you should have regular mammograms. Talk with your health care provider about your test results, treatment options, and if necessary, the need for more tests. Vaccines  Your health care provider may recommend certain vaccines, such as: Influenza vaccine. This is recommended every year. Tetanus, diphtheria, and acellular pertussis (Tdap, Td) vaccine. You may need a Td booster every 10 years. Zoster vaccine. You may need this after age 19. Pneumococcal 13-valent conjugate (PCV13) vaccine. One dose is recommended after age 42. Pneumococcal polysaccharide (PPSV23) vaccine. One dose is recommended after age 26. Talk to your health care provider about which screenings and vaccines you need and how often you need them. This information is not intended to replace advice given to you by your health care provider. Make sure you discuss any questions you have with your health care provider. Document Released: 07/24/2015 Document Revised: 03/16/2016 Document Reviewed: 04/28/2015 Elsevier Interactive Patient Education  2017 [employer].  Fall Prevention in the Home Falls can cause injuries. They can happen to people of all ages. There are many things you can do to make your home safe and to help prevent falls. What can I do on the outside of my home? Regularly fix the edges of walkways and driveways and fix any cracks. Remove anything that might make you trip as you walk through a door, such as a raised step or threshold. Trim any bushes or trees on the path to your home. Use bright outdoor lighting. Clear any walking paths of anything that might make someone trip, such as rocks or tools. Regularly check to see if handrails are loose or broken. Make sure that both sides of any steps have handrails. Any raised  decks and porches should have guardrails on the edges. Have any leaves, snow, or ice cleared regularly. Use sand or salt on walking paths during winter. Clean up any spills in your garage right away. This includes oil or grease spills. What can I do in the bathroom? Use night lights. Install grab bars by the toilet and in the tub and shower. Do not use towel bars as grab bars. Use non-skid mats or decals in the tub or shower. If you need to sit down in the shower, use a plastic, non-slip stool. Keep the floor dry. Clean up any water that spills on the floor as soon as it happens. Remove soap buildup in the tub or shower regularly. Attach bath mats securely with double-sided non-slip rug tape. Do not have throw rugs and other things on the floor that can make you trip. What can I do in the bedroom? Use night lights. Make sure that you have a light by your bed that is easy to reach. Do not use any sheets or blankets that are too big for your bed. They should not hang down onto the floor. Have a firm chair that has side arms. You can use this for support while you  get dressed. Do not have throw rugs and other things on the floor that can make you trip. What can I do in the kitchen? Clean up any spills right away. Avoid walking on wet floors. Keep items that you use a lot in easy-to-reach places. If you need to reach something above you, use a strong step stool that has a grab bar. Keep electrical cords out of the way. Do not use floor polish or wax that makes floors slippery. If you must use wax, use non-skid floor wax. Do not have throw rugs and other things on the floor that can make you trip. What can I do with my stairs? Do not leave any items on the stairs. Make sure that there are handrails on both sides of the stairs and use them. Fix handrails that are broken or loose. Make sure that handrails are as long as the stairways. Check any carpeting to make sure that it is firmly attached  to the stairs. Fix any carpet that is loose or worn. Avoid having throw rugs at the top or bottom of the stairs. If you do have throw rugs, attach them to the floor with carpet tape. Make sure that you have a light switch at the top of the stairs and the bottom of the stairs. If you do not have them, ask someone to add them for you. What else can I do to help prevent falls? Wear shoes that: Do not have high heels. Have rubber bottoms. Are comfortable and fit you well. Are closed at the toe. Do not wear sandals. If you use a stepladder: Make sure that it is fully opened. Do not climb a closed stepladder. Make sure that both sides of the stepladder are locked into place. Ask someone to hold it for you, if possible. Clearly mark and make sure that you can see: Any grab bars or handrails. First and last steps. Where the edge of each step is. Use tools that help you move around (mobility aids) if they are needed. These include: Canes. Walkers. Scooters. Crutches. Turn on the lights when you go into a dark area. Replace any light bulbs as soon as they burn out. Set up your furniture so you have a clear path. Avoid moving your furniture around. If any of your floors are uneven, fix them. If there are any pets around you, be aware of where they are. Review your medicines with your doctor. Some medicines can make you feel dizzy. This can increase your chance of falling. Ask your doctor what other things that you can do to help prevent falls. This information is not intended to replace advice given to you by your health care provider. Make sure you discuss any questions you have with your health care provider. Document Released: 04/23/2009 Document Revised: 12/03/2015 Document Reviewed: 08/01/2014 Elsevier Interactive Patient Education  2017 [employer].
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
## 2022-11-29 ENCOUNTER — Other Ambulatory Visit: Payer: Self-pay

## 2022-11-29 DIAGNOSIS — G894 Chronic pain syndrome: Secondary | ICD-10-CM

## 2022-11-29 MED ORDER — OXYCODONE-ACETAMINOPHEN 5-325 MG PO TABS: 1.0000 | ORAL_TABLET | Freq: Two times a day (BID) | ORAL | 0 refills | Status: DC | PRN

## 2022-12-07 ENCOUNTER — Other Ambulatory Visit: Payer: Self-pay | Admitting: Family Medicine

## 2022-12-07 DIAGNOSIS — J309 Allergic rhinitis, unspecified: Secondary | ICD-10-CM

## 2022-12-13 ENCOUNTER — Encounter: Payer: Self-pay | Admitting: Family Medicine

## 2022-12-20 ENCOUNTER — Other Ambulatory Visit: Payer: Self-pay

## 2022-12-20 DIAGNOSIS — E1165 Type 2 diabetes mellitus with hyperglycemia: Secondary | ICD-10-CM

## 2022-12-26 ENCOUNTER — Other Ambulatory Visit: Payer: Self-pay

## 2022-12-26 DIAGNOSIS — G894 Chronic pain syndrome: Secondary | ICD-10-CM

## 2022-12-26 MED ORDER — OXYCODONE-ACETAMINOPHEN 5-325 MG PO TABS: 1.0000 | ORAL_TABLET | Freq: Two times a day (BID) | ORAL | 0 refills | Status: DC | PRN

## 2022-12-30 ENCOUNTER — Other Ambulatory Visit: Payer: Self-pay | Admitting: Student

## 2023-01-11 ENCOUNTER — Emergency Department (HOSPITAL_COMMUNITY): Payer: Medicare HMO

## 2023-01-11 ENCOUNTER — Inpatient Hospital Stay (HOSPITAL_COMMUNITY)
Admission: EM | Admit: 2023-01-11 | Discharge: 2023-01-17 | DRG: 472 | Disposition: A | Discharge status: Rehabilitation Facility | Payer: Medicare HMO | Attending: Neurosurgery | Admitting: Neurosurgery

## 2023-01-11 ENCOUNTER — Other Ambulatory Visit: Payer: Self-pay

## 2023-01-11 ENCOUNTER — Inpatient Hospital Stay (HOSPITAL_COMMUNITY)
Admission: EM | Disposition: A | Discharge status: Rehabilitation Facility | Payer: Self-pay | Source: Home / Self Care | Attending: Neurosurgery

## 2023-01-11 ENCOUNTER — Emergency Department (HOSPITAL_COMMUNITY): Payer: Medicare HMO | Admitting: Anesthesiology

## 2023-01-11 ENCOUNTER — Encounter (HOSPITAL_COMMUNITY): Payer: Self-pay

## 2023-01-11 DIAGNOSIS — G992 Myelopathy in diseases classified elsewhere: Secondary | ICD-10-CM | POA: Diagnosis present

## 2023-01-11 DIAGNOSIS — Z7982 Long term (current) use of aspirin: Secondary | ICD-10-CM

## 2023-01-11 DIAGNOSIS — S14121D Central cord syndrome at C1 level of cervical spinal cord, subsequent encounter: Secondary | ICD-10-CM | POA: Diagnosis not present

## 2023-01-11 DIAGNOSIS — K5901 Slow transit constipation: Secondary | ICD-10-CM | POA: Diagnosis not present

## 2023-01-11 DIAGNOSIS — R252 Cramp and spasm: Secondary | ICD-10-CM | POA: Diagnosis not present

## 2023-01-11 DIAGNOSIS — E119 Type 2 diabetes mellitus without complications: Secondary | ICD-10-CM | POA: Diagnosis present

## 2023-01-11 DIAGNOSIS — M542 Cervicalgia: Secondary | ICD-10-CM | POA: Diagnosis present

## 2023-01-11 DIAGNOSIS — J45909 Unspecified asthma, uncomplicated: Secondary | ICD-10-CM | POA: Diagnosis not present

## 2023-01-11 DIAGNOSIS — J4520 Mild intermittent asthma, uncomplicated: Secondary | ICD-10-CM | POA: Diagnosis present

## 2023-01-11 DIAGNOSIS — Z9889 Other specified postprocedural states: Secondary | ICD-10-CM

## 2023-01-11 DIAGNOSIS — G8929 Other chronic pain: Secondary | ICD-10-CM | POA: Diagnosis present

## 2023-01-11 DIAGNOSIS — E785 Hyperlipidemia, unspecified: Secondary | ICD-10-CM | POA: Diagnosis present

## 2023-01-11 DIAGNOSIS — Z79899 Other long term (current) drug therapy: Secondary | ICD-10-CM | POA: Diagnosis not present

## 2023-01-11 DIAGNOSIS — M4802 Spinal stenosis, cervical region: Secondary | ICD-10-CM

## 2023-01-11 DIAGNOSIS — Y92096 Garden or yard of other non-institutional residence as the place of occurrence of the external cause: Secondary | ICD-10-CM

## 2023-01-11 DIAGNOSIS — R739 Hyperglycemia, unspecified: Secondary | ICD-10-CM | POA: Diagnosis not present

## 2023-01-11 DIAGNOSIS — M5010 Cervical disc disorder with radiculopathy, unspecified cervical region: Secondary | ICD-10-CM | POA: Diagnosis not present

## 2023-01-11 DIAGNOSIS — Z87891 Personal history of nicotine dependence: Secondary | ICD-10-CM

## 2023-01-11 DIAGNOSIS — M2578 Osteophyte, vertebrae: Secondary | ICD-10-CM | POA: Diagnosis present

## 2023-01-11 DIAGNOSIS — I10 Essential (primary) hypertension: Secondary | ICD-10-CM | POA: Diagnosis not present

## 2023-01-11 DIAGNOSIS — W01198A Fall on same level from slipping, tripping and stumbling with subsequent striking against other object, initial encounter: Secondary | ICD-10-CM | POA: Diagnosis present

## 2023-01-11 DIAGNOSIS — S1989XD Other specified injuries of other specified part of neck, subsequent encounter: Secondary | ICD-10-CM | POA: Diagnosis not present

## 2023-01-11 DIAGNOSIS — Z91048 Other nonmedicinal substance allergy status: Secondary | ICD-10-CM

## 2023-01-11 DIAGNOSIS — K219 Gastro-esophageal reflux disease without esophagitis: Secondary | ICD-10-CM | POA: Diagnosis present

## 2023-01-11 DIAGNOSIS — Z881 Allergy status to other antibiotic agents status: Secondary | ICD-10-CM

## 2023-01-11 DIAGNOSIS — Y93H2 Activity, gardening and landscaping: Secondary | ICD-10-CM

## 2023-01-11 DIAGNOSIS — Z4789 Encounter for other orthopedic aftercare: Secondary | ICD-10-CM | POA: Diagnosis not present

## 2023-01-11 DIAGNOSIS — Z95828 Presence of other vascular implants and grafts: Secondary | ICD-10-CM | POA: Diagnosis not present

## 2023-01-11 DIAGNOSIS — G9520 Unspecified cord compression: Secondary | ICD-10-CM | POA: Diagnosis present

## 2023-01-11 DIAGNOSIS — W19XXXA Unspecified fall, initial encounter: Secondary | ICD-10-CM

## 2023-01-11 DIAGNOSIS — Z7984 Long term (current) use of oral hypoglycemic drugs: Secondary | ICD-10-CM | POA: Diagnosis not present

## 2023-01-11 DIAGNOSIS — G959 Disease of spinal cord, unspecified: Secondary | ICD-10-CM | POA: Diagnosis not present

## 2023-01-11 HISTORY — PX: ANTERIOR CERVICAL DECOMP/DISCECTOMY FUSION: SHX1161

## 2023-01-11 LAB — CBC WITH DIFFERENTIAL/PLATELET
Abs Immature Granulocytes: 0.04 10*3/uL (ref 0.00–0.07)
Basophils Absolute: 0.1 10*3/uL (ref 0.0–0.1)
Basophils Relative: 0 %
Eosinophils Absolute: 0.2 10*3/uL (ref 0.0–0.5)
Eosinophils Relative: 2 %
HCT: 36.2 % (ref 36.0–46.0)
Hemoglobin: 11.2 g/dL — ABNORMAL LOW (ref 12.0–15.0)
Immature Granulocytes: 0 %
Lymphocytes Relative: 14 %
Lymphs Abs: 1.9 10*3/uL (ref 0.7–4.0)
MCH: 28.7 pg (ref 26.0–34.0)
MCHC: 30.9 g/dL (ref 30.0–36.0)
MCV: 92.8 fL (ref 80.0–100.0)
Monocytes Absolute: 0.8 10*3/uL (ref 0.1–1.0)
Monocytes Relative: 6 %
Neutro Abs: 10.5 10*3/uL — ABNORMAL HIGH (ref 1.7–7.7)
Neutrophils Relative %: 78 %
Platelets: 377 10*3/uL (ref 150–400)
RBC: 3.9 MIL/uL (ref 3.87–5.11)
RDW: 15 % (ref 11.5–15.5)
WBC: 13.5 10*3/uL — ABNORMAL HIGH (ref 4.0–10.5)
nRBC: 0 % (ref 0.0–0.2)

## 2023-01-11 LAB — COMPREHENSIVE METABOLIC PANEL
ALT: 15 U/L (ref 0–44)
AST: 19 U/L (ref 15–41)
Albumin: 3.3 g/dL — ABNORMAL LOW (ref 3.5–5.0)
Alkaline Phosphatase: 57 U/L (ref 38–126)
Anion gap: 8 (ref 5–15)
BUN: 15 mg/dL (ref 8–23)
CO2: 24 mmol/L (ref 22–32)
Calcium: 9.1 mg/dL (ref 8.9–10.3)
Chloride: 107 mmol/L (ref 98–111)
Creatinine, Ser: 0.86 mg/dL (ref 0.44–1.00)
GFR, Estimated: >60 mL/min (ref >60)
Glucose, Bld: 127 mg/dL — ABNORMAL HIGH (ref 70–99)
Potassium: 4 mmol/L (ref 3.5–5.1)
Sodium: 139 mmol/L (ref 135–145)
Total Bilirubin: 0.2 mg/dL — ABNORMAL LOW (ref 0.3–1.2)
Total Protein: 6.7 g/dL (ref 6.5–8.1)

## 2023-01-11 LAB — I-STAT CHEM 8, ED
BUN: 17 mg/dL (ref 8–23)
Calcium, Ion: 1.22 mmol/L (ref 1.15–1.40)
Chloride: 109 mmol/L (ref 98–111)
Creatinine, Ser: 0.9 mg/dL (ref 0.44–1.00)
Glucose, Bld: 123 mg/dL — ABNORMAL HIGH (ref 70–99)
HCT: 37 % (ref 36.0–46.0)
Hemoglobin: 12.6 g/dL (ref 12.0–15.0)
Potassium: 4 mmol/L (ref 3.5–5.1)
Sodium: 143 mmol/L (ref 135–145)
TCO2: 25 mmol/L (ref 22–32)

## 2023-01-11 LAB — GLUCOSE, CAPILLARY
Glucose-Capillary: 146 mg/dL — ABNORMAL HIGH (ref 70–99)
Glucose-Capillary: 127 mg/dL — ABNORMAL HIGH (ref 70–99)

## 2023-01-11 LAB — TYPE AND SCREEN
ABO/RH(D): O POS
Antibody Screen: NEGATIVE

## 2023-01-11 LAB — SURGICAL PCR SCREEN
MRSA, PCR: NEGATIVE
Staphylococcus aureus: NEGATIVE

## 2023-01-11 LAB — ETHANOL: Alcohol, Ethyl (B): <10 mg/dL (ref <10)

## 2023-01-11 LAB — CBG MONITORING, ED: Glucose-Capillary: 106 mg/dL — ABNORMAL HIGH (ref 70–99)

## 2023-01-11 SURGERY — ANTERIOR CERVICAL DECOMPRESSION/DISCECTOMY FUSION 1 LEVEL
Anesthesia: General

## 2023-01-11 MED ORDER — FENTANYL CITRATE (PF) 250 MCG/5ML IJ SOLN
INTRAMUSCULAR | Status: DC | PRN
  Administered 2023-01-11 (×2): 50 ug via INTRAVENOUS
  Administered 2023-01-11: 100 ug via INTRAVENOUS

## 2023-01-11 MED ORDER — ZINC SULFATE 220 (50 ZN) MG PO CAPS
220.0000 mg | ORAL_CAPSULE | Freq: Every day | ORAL | Status: DC
  Administered 2023-01-12 – 2023-01-17 (×6): 220 mg via ORAL
  Filled 2023-01-11 (×6): qty 1

## 2023-01-11 MED ORDER — DEXAMETHASONE SODIUM PHOSPHATE 10 MG/ML IJ SOLN
INTRAMUSCULAR | Status: DC | PRN
  Administered 2023-01-11: 10 mg via INTRAVENOUS

## 2023-01-11 MED ORDER — ASPIRIN 81 MG PO TBEC
81.0000 mg | DELAYED_RELEASE_TABLET | Freq: Every day | ORAL | Status: DC
  Administered 2023-01-12 – 2023-01-17 (×6): 81 mg via ORAL
  Filled 2023-01-11 (×6): qty 1

## 2023-01-11 MED ORDER — MIDAZOLAM HCL 2 MG/2ML IJ SOLN
INTRAMUSCULAR | Status: DC | PRN
  Administered 2023-01-11: 2 mg via INTRAVENOUS

## 2023-01-11 MED ORDER — 0.9 % SODIUM CHLORIDE (POUR BTL) OPTIME
TOPICAL | Status: DC | PRN
  Administered 2023-01-11: 1000 mL

## 2023-01-11 MED ORDER — ROCURONIUM BROMIDE 10 MG/ML (PF) SYRINGE
PREFILLED_SYRINGE | INTRAVENOUS | Status: DC | PRN
  Administered 2023-01-11: 100 mg via INTRAVENOUS

## 2023-01-11 MED ORDER — FLUTICASONE PROPIONATE 50 MCG/ACT NA SUSP
2.0000 | Freq: Every day | NASAL | Status: DC
  Administered 2023-01-15 – 2023-01-16 (×2): 2 via NASAL
  Filled 2023-01-11: qty 16

## 2023-01-11 MED ORDER — PHENYLEPHRINE HCL-NACL 20-0.9 MG/250ML-% IV SOLN
INTRAVENOUS | Status: DC | PRN
  Administered 2023-01-11: 50 ug/min via INTRAVENOUS

## 2023-01-11 MED ORDER — HYDROMORPHONE HCL 1 MG/ML IJ SOLN
INTRAMUSCULAR | Status: AC
  Filled 2023-01-11: qty 1

## 2023-01-11 MED ORDER — KETAMINE HCL 50 MG/5ML IJ SOSY
PREFILLED_SYRINGE | INTRAMUSCULAR | Status: AC
  Filled 2023-01-11: qty 5

## 2023-01-11 MED ORDER — MIDAZOLAM HCL 2 MG/2ML IJ SOLN
INTRAMUSCULAR | Status: AC
  Filled 2023-01-11: qty 2

## 2023-01-11 MED ORDER — OXYCODONE HCL 5 MG/5ML PO SOLN: 5.0000 mg | Freq: Once | ORAL | Status: DC | PRN

## 2023-01-11 MED ORDER — LIDOCAINE-EPINEPHRINE 0.5 %-1:200000 IJ SOLN
INTRAMUSCULAR | Status: AC
  Filled 2023-01-11: qty 50

## 2023-01-11 MED ORDER — POLYVINYL ALCOHOL 1.4 % OP SOLN: Freq: Every day | OPHTHALMIC | Status: DC | PRN

## 2023-01-11 MED ORDER — SENNA 8.6 MG PO TABS
1.0000 | ORAL_TABLET | Freq: Two times a day (BID) | ORAL | Status: DC
  Administered 2023-01-11 – 2023-01-17 (×12): 8.6 mg via ORAL
  Filled 2023-01-11 (×11): qty 1

## 2023-01-11 MED ORDER — MENTHOL 3 MG MT LOZG: 1.0000 | LOZENGE | OROMUCOSAL | Status: DC | PRN

## 2023-01-11 MED ORDER — MORPHINE SULFATE (PF) 2 MG/ML IV SOLN
2.0000 mg | INTRAVENOUS | Status: DC | PRN
  Administered 2023-01-11: 2 mg via INTRAVENOUS
  Filled 2023-01-11: qty 1

## 2023-01-11 MED ORDER — HYDROCHLOROTHIAZIDE 25 MG PO TABS
25.0000 mg | ORAL_TABLET | Freq: Every day | ORAL | Status: DC
  Administered 2023-01-12 – 2023-01-17 (×6): 25 mg via ORAL
  Filled 2023-01-11 (×6): qty 1

## 2023-01-11 MED ORDER — POTASSIUM CHLORIDE IN NACL 20-0.9 MEQ/L-% IV SOLN
INTRAVENOUS | Status: DC
  Filled 2023-01-11 (×2): qty 1000

## 2023-01-11 MED ORDER — DEXAMETHASONE 4 MG PO TABS
4.0000 mg | ORAL_TABLET | Freq: Four times a day (QID) | ORAL | Status: DC
  Administered 2023-01-12: 4 mg via ORAL
  Filled 2023-01-11: qty 1

## 2023-01-11 MED ORDER — LIDOCAINE 2% (20 MG/ML) 5 ML SYRINGE
INTRAMUSCULAR | Status: DC | PRN
  Administered 2023-01-11: 60 mg via INTRAVENOUS

## 2023-01-11 MED ORDER — ESMOLOL HCL 100 MG/10ML IV SOLN
INTRAVENOUS | Status: DC | PRN
  Administered 2023-01-11: 30 mg via INTRAVENOUS

## 2023-01-11 MED ORDER — PHENOL 1.4 % MT LIQD: 1.0000 | OROMUCOSAL | Status: DC | PRN

## 2023-01-11 MED ORDER — BISACODYL 5 MG PO TBEC
5.0000 mg | DELAYED_RELEASE_TABLET | Freq: Every day | ORAL | Status: DC | PRN
  Administered 2023-01-13 – 2023-01-15 (×2): 5 mg via ORAL
  Filled 2023-01-11 (×2): qty 1

## 2023-01-11 MED ORDER — OXYCODONE HCL 5 MG PO TABS
10.0000 mg | ORAL_TABLET | ORAL | Status: DC | PRN
  Administered 2023-01-12 – 2023-01-17 (×12): 10 mg via ORAL
  Filled 2023-01-11 (×12): qty 2

## 2023-01-11 MED ORDER — SODIUM CHLORIDE 0.9% FLUSH
3.0000 mL | Freq: Two times a day (BID) | INTRAVENOUS | Status: DC
  Administered 2023-01-11 – 2023-01-17 (×12): 3 mL via INTRAVENOUS

## 2023-01-11 MED ORDER — OXYCODONE HCL ER 15 MG PO T12A
15.0000 mg | EXTENDED_RELEASE_TABLET | Freq: Two times a day (BID) | ORAL | Status: DC
  Administered 2023-01-11 – 2023-01-17 (×12): 15 mg via ORAL
  Filled 2023-01-11 (×12): qty 1

## 2023-01-11 MED ORDER — CEFAZOLIN SODIUM-DEXTROSE 2-3 GM-%(50ML) IV SOLR
INTRAVENOUS | Status: DC | PRN
  Administered 2023-01-11: 2 g via INTRAVENOUS

## 2023-01-11 MED ORDER — HYDROMORPHONE HCL 1 MG/ML IJ SOLN
1.0000 mg | Freq: Once | INTRAMUSCULAR | Status: AC
  Administered 2023-01-11: 1 mg via INTRAVENOUS
  Filled 2023-01-11: qty 1

## 2023-01-11 MED ORDER — ACETAMINOPHEN 325 MG PO TABS
650.0000 mg | ORAL_TABLET | ORAL | Status: DC | PRN
  Administered 2023-01-13 – 2023-01-16 (×2): 650 mg via ORAL
  Filled 2023-01-11 (×2): qty 2

## 2023-01-11 MED ORDER — SUGAMMADEX SODIUM 200 MG/2ML IV SOLN
INTRAVENOUS | Status: DC | PRN
  Administered 2023-01-11: 200 mg via INTRAVENOUS

## 2023-01-11 MED ORDER — CHLORHEXIDINE GLUCONATE 0.12 % MT SOLN
OROMUCOSAL | Status: AC
  Filled 2023-01-11: qty 15

## 2023-01-11 MED ORDER — THROMBIN (RECOMBINANT) 5000 UNITS EX SOLR
CUTANEOUS | Status: DC | PRN
  Administered 2023-01-11: 10 mL via TOPICAL

## 2023-01-11 MED ORDER — EMPAGLIFLOZIN 10 MG PO TABS
10.0000 mg | ORAL_TABLET | Freq: Every day | ORAL | Status: DC
  Administered 2023-01-12 – 2023-01-17 (×6): 10 mg via ORAL
  Filled 2023-01-11 (×7): qty 1

## 2023-01-11 MED ORDER — SODIUM CHLORIDE 0.9 % IV SOLN: 250.0000 mL | INTRAVENOUS | Status: DC

## 2023-01-11 MED ORDER — FENTANYL CITRATE (PF) 250 MCG/5ML IJ SOLN
INTRAMUSCULAR | Status: AC
  Filled 2023-01-11: qty 5

## 2023-01-11 MED ORDER — EPHEDRINE SULFATE-NACL 50-0.9 MG/10ML-% IV SOSY
PREFILLED_SYRINGE | INTRAVENOUS | Status: DC | PRN
  Administered 2023-01-11: 15 mg via INTRAVENOUS

## 2023-01-11 MED ORDER — KETAMINE HCL 10 MG/ML IJ SOLN
INTRAMUSCULAR | Status: DC | PRN
  Administered 2023-01-11: 20 mg via INTRAVENOUS
  Administered 2023-01-11: 10 mg via INTRAVENOUS

## 2023-01-11 MED ORDER — DEXAMETHASONE SODIUM PHOSPHATE 4 MG/ML IJ SOLN
4.0000 mg | Freq: Four times a day (QID) | INTRAMUSCULAR | Status: DC
  Administered 2023-01-11 – 2023-01-12 (×3): 4 mg via INTRAVENOUS
  Filled 2023-01-11 (×3): qty 1

## 2023-01-11 MED ORDER — SUCCINYLCHOLINE CHLORIDE 200 MG/10ML IV SOSY
PREFILLED_SYRINGE | INTRAVENOUS | Status: DC | PRN
  Administered 2023-01-11: 120 mg via INTRAVENOUS

## 2023-01-11 MED ORDER — PHENYLEPHRINE 80 MCG/ML (10ML) SYRINGE FOR IV PUSH (FOR BLOOD PRESSURE SUPPORT)
PREFILLED_SYRINGE | INTRAVENOUS | Status: DC | PRN
  Administered 2023-01-11 (×2): 80 ug via INTRAVENOUS
  Administered 2023-01-11: 120 ug via INTRAVENOUS
  Administered 2023-01-11: 160 ug via INTRAVENOUS

## 2023-01-11 MED ORDER — ACETAMINOPHEN 650 MG RE SUPP: 650.0000 mg | RECTAL | Status: DC | PRN

## 2023-01-11 MED ORDER — VITAMIN D 25 MCG (1000 UNIT) PO TABS
1000.0000 [IU] | ORAL_TABLET | Freq: Every day | ORAL | Status: DC
  Administered 2023-01-12 – 2023-01-17 (×6): 1000 [IU] via ORAL
  Filled 2023-01-11 (×6): qty 1

## 2023-01-11 MED ORDER — METFORMIN HCL 500 MG PO TABS
500.0000 mg | ORAL_TABLET | Freq: Two times a day (BID) | ORAL | Status: DC
  Administered 2023-01-12 – 2023-01-17 (×11): 500 mg via ORAL
  Filled 2023-01-11 (×12): qty 1

## 2023-01-11 MED ORDER — ONDANSETRON HCL 4 MG/2ML IJ SOLN: 4.0000 mg | Freq: Four times a day (QID) | INTRAMUSCULAR | Status: DC | PRN

## 2023-01-11 MED ORDER — PROPOFOL 10 MG/ML IV BOLUS
INTRAVENOUS | Status: AC
  Filled 2023-01-11: qty 20

## 2023-01-11 MED ORDER — SODIUM CHLORIDE 0.9% FLUSH: 3.0000 mL | INTRAVENOUS | Status: DC | PRN

## 2023-01-11 MED ORDER — VITAMIN B-12 1000 MCG PO TABS
1000.0000 ug | ORAL_TABLET | Freq: Every day | ORAL | Status: DC
  Administered 2023-01-12 – 2023-01-17 (×6): 1000 ug via ORAL
  Filled 2023-01-11 (×6): qty 1

## 2023-01-11 MED ORDER — DIAZEPAM 5 MG PO TABS: 5.0000 mg | ORAL_TABLET | Freq: Four times a day (QID) | ORAL | Status: DC | PRN

## 2023-01-11 MED ORDER — ONDANSETRON HCL 4 MG PO TABS: 4.0000 mg | ORAL_TABLET | Freq: Four times a day (QID) | ORAL | Status: DC | PRN

## 2023-01-11 MED ORDER — HYDROMORPHONE HCL 1 MG/ML IJ SOLN
0.2500 mg | INTRAMUSCULAR | Status: DC | PRN
  Administered 2023-01-11: 0.25 mg via INTRAVENOUS
  Administered 2023-01-11: 0.5 mg via INTRAVENOUS
  Administered 2023-01-11: 0.25 mg via INTRAVENOUS

## 2023-01-11 MED ORDER — OXYCODONE HCL 5 MG PO TABS: 5.0000 mg | ORAL_TABLET | Freq: Once | ORAL | Status: DC | PRN

## 2023-01-11 MED ORDER — CEFAZOLIN SODIUM-DEXTROSE 2-4 GM/100ML-% IV SOLN
INTRAVENOUS | Status: AC
  Filled 2023-01-11: qty 100

## 2023-01-11 MED ORDER — HYDROMORPHONE HCL 1 MG/ML IJ SOLN
0.5000 mg | Freq: Once | INTRAMUSCULAR | Status: AC
  Administered 2023-01-11: 0.5 mg via INTRAVENOUS
  Filled 2023-01-11: qty 1

## 2023-01-11 MED ORDER — PROPOFOL 10 MG/ML IV BOLUS
INTRAVENOUS | Status: DC | PRN
  Administered 2023-01-11: 100 mg via INTRAVENOUS

## 2023-01-11 MED ORDER — LIDOCAINE IN D5W 4-5 MG/ML-% IV SOLN
INTRAVENOUS | Status: DC | PRN
  Administered 2023-01-11: 2 mg/min via INTRAVENOUS

## 2023-01-11 MED ORDER — MAGNESIUM CITRATE PO SOLN
1.0000 | Freq: Once | ORAL | Status: AC | PRN
  Administered 2023-01-16: 1 via ORAL
  Filled 2023-01-11: qty 296

## 2023-01-11 MED ORDER — LIDOCAINE-EPINEPHRINE 0.5 %-1:200000 IJ SOLN
INTRAMUSCULAR | Status: DC | PRN
  Administered 2023-01-11: 5 mL

## 2023-01-11 MED ORDER — ROCURONIUM BROMIDE 10 MG/ML (PF) SYRINGE
PREFILLED_SYRINGE | INTRAVENOUS | Status: AC
  Filled 2023-01-11: qty 10

## 2023-01-11 MED ORDER — EPHEDRINE 5 MG/ML INJ
INTRAVENOUS | Status: AC
  Filled 2023-01-11: qty 5

## 2023-01-11 MED ORDER — THROMBIN 5000 UNITS EX SOLR
CUTANEOUS | Status: AC
  Filled 2023-01-11: qty 10000

## 2023-01-11 MED ORDER — ONDANSETRON HCL 4 MG/2ML IJ SOLN
INTRAMUSCULAR | Status: DC | PRN
  Administered 2023-01-11: 4 mg via INTRAVENOUS

## 2023-01-11 MED ORDER — LIDOCAINE 2% (20 MG/ML) 5 ML SYRINGE
INTRAMUSCULAR | Status: AC
  Filled 2023-01-11: qty 5

## 2023-01-11 MED ORDER — IRBESARTAN 150 MG PO TABS
150.0000 mg | ORAL_TABLET | Freq: Every day | ORAL | Status: DC
  Administered 2023-01-12 – 2023-01-17 (×6): 150 mg via ORAL
  Filled 2023-01-11 (×6): qty 1

## 2023-01-11 MED ORDER — ONDANSETRON HCL 4 MG/2ML IJ SOLN: 4.0000 mg | Freq: Once | INTRAMUSCULAR | Status: DC | PRN

## 2023-01-11 MED ORDER — ESMOLOL HCL 100 MG/10ML IV SOLN
INTRAVENOUS | Status: AC
  Filled 2023-01-11: qty 20

## 2023-01-11 MED ORDER — LACTATED RINGERS IV SOLN: INTRAVENOUS | Status: DC | PRN

## 2023-01-11 MED ORDER — MORPHINE SULFATE (PF) 4 MG/ML IV SOLN
4.0000 mg | Freq: Once | INTRAVENOUS | Status: AC
  Administered 2023-01-11: 4 mg via INTRAVENOUS
  Filled 2023-01-11: qty 1

## 2023-01-11 MED ORDER — OXYCODONE HCL 5 MG PO TABS
5.0000 mg | ORAL_TABLET | ORAL | Status: DC | PRN
  Administered 2023-01-14 – 2023-01-16 (×2): 5 mg via ORAL
  Filled 2023-01-11 (×2): qty 1

## 2023-01-11 MED ORDER — ONDANSETRON HCL 4 MG/2ML IJ SOLN
4.0000 mg | Freq: Once | INTRAMUSCULAR | Status: AC
  Administered 2023-01-11: 4 mg via INTRAVENOUS
  Filled 2023-01-11: qty 2

## 2023-01-11 MED ORDER — SUCCINYLCHOLINE CHLORIDE 200 MG/10ML IV SOSY
PREFILLED_SYRINGE | INTRAVENOUS | Status: AC
  Filled 2023-01-11: qty 10

## 2023-01-11 MED ORDER — SENNOSIDES-DOCUSATE SODIUM 8.6-50 MG PO TABS: 1.0000 | ORAL_TABLET | Freq: Every evening | ORAL | Status: DC | PRN

## 2023-01-11 MED ORDER — ATORVASTATIN CALCIUM 80 MG PO TABS
80.0000 mg | ORAL_TABLET | Freq: Every day | ORAL | Status: DC
  Administered 2023-01-12 – 2023-01-17 (×6): 80 mg via ORAL
  Filled 2023-01-11 (×7): qty 1

## 2023-01-11 SURGICAL SUPPLY — 48 items
ADH SKN CLS APL DERMABOND .7 (GAUZE/BANDAGES/DRESSINGS) ×1
BAG COUNTER SPONGE SURGICOUNT (BAG) ×1 IMPLANT
BAG SPNG CNTER NS LX DISP (BAG) ×1
BLADE CLIPPER SURG (BLADE) IMPLANT
BONE SPACER C-PLUG 14X7X12 (Bone Implant) IMPLANT
BUR DRUM 4.0 (BURR) ×1 IMPLANT
BUR MATCHSTICK NEURO 3.0 LAGG (BURR) ×1 IMPLANT
CANISTER SUCT 3000ML PPV (MISCELLANEOUS) ×1 IMPLANT
DERMABOND ADVANCED .7 DNX12 (GAUZE/BANDAGES/DRESSINGS) ×1 IMPLANT
DRAPE HALF SHEET 40X57 (DRAPES) IMPLANT
DRAPE LAPAROTOMY 100X72 PEDS (DRAPES) ×1 IMPLANT
DRAPE MICROSCOPE SLANT 54X150 (MISCELLANEOUS) ×1 IMPLANT
DURAPREP 6ML APPLICATOR 50/CS (WOUND CARE) ×1 IMPLANT
ELECT COATED BLADE 2.86 ST (ELECTRODE) ×1 IMPLANT
ELECT REM PT RETURN 9FT ADLT (ELECTROSURGICAL) ×1
ELECTRODE REM PT RTRN 9FT ADLT (ELECTROSURGICAL) ×1 IMPLANT
GAUZE 4X4 16PLY ~~LOC~~+RFID DBL (SPONGE) IMPLANT
GLOVE ECLIPSE 6.5 STRL STRAW (GLOVE) ×1 IMPLANT
GLOVE EXAM NITRILE XL STR (GLOVE) IMPLANT
GOWN STRL REUS W/ TWL LRG LVL3 (GOWN DISPOSABLE) ×2 IMPLANT
GOWN STRL REUS W/ TWL XL LVL3 (GOWN DISPOSABLE) IMPLANT
GOWN STRL REUS W/TWL 2XL LVL3 (GOWN DISPOSABLE) IMPLANT
GOWN STRL REUS W/TWL LRG LVL3 (GOWN DISPOSABLE) ×2
GOWN STRL REUS W/TWL XL LVL3 (GOWN DISPOSABLE)
KIT BASIN OR (CUSTOM PROCEDURE TRAY) ×1 IMPLANT
KIT TURNOVER KIT B (KITS) ×1 IMPLANT
NDL HYPO 25X1 1.5 SAFETY (NEEDLE) ×1 IMPLANT
NDL SPNL 22GX3.5 QUINCKE BK (NEEDLE) ×1 IMPLANT
NEEDLE HYPO 25X1 1.5 SAFETY (NEEDLE) ×1 IMPLANT
NEEDLE SPNL 22GX3.5 QUINCKE BK (NEEDLE) ×1 IMPLANT
NS IRRIG 1000ML POUR BTL (IV SOLUTION) ×1 IMPLANT
PACK LAMINECTOMY NEURO (CUSTOM PROCEDURE TRAY) ×1 IMPLANT
PAD ARMBOARD 7.5X6 YLW CONV (MISCELLANEOUS) ×3 IMPLANT
PIN DISTRACTION 14MM (PIN) IMPLANT
PLATE ACP 1-LEVEL 1.6V20 (Plate) IMPLANT
SCREW ACP 3.5 X 13 S/D VARIA (Screw) ×4 IMPLANT
SCREW ACP 3.5X13 S/D VAR ANGLE (Screw) IMPLANT
SOL ELECTROSURG ANTI STICK (MISCELLANEOUS) ×1
SOLUTION ELECTROSURG ANTI STCK (MISCELLANEOUS) ×1 IMPLANT
SPIKE FLUID TRANSFER (MISCELLANEOUS) ×1 IMPLANT
SPONGE INTESTINAL PEANUT (DISPOSABLE) ×1 IMPLANT
SPONGE SURGIFOAM ABS GEL SZ50 (HEMOSTASIS) ×1 IMPLANT
SUT VIC AB 0 CT1 27 (SUTURE) ×1
SUT VIC AB 0 CT1 27XBRD ANTBC (SUTURE) IMPLANT
SUT VIC AB 3-0 SH 8-18 (SUTURE) ×1 IMPLANT
TOWEL GREEN STERILE (TOWEL DISPOSABLE) ×1 IMPLANT
TOWEL GREEN STERILE FF (TOWEL DISPOSABLE) ×1 IMPLANT
WATER STERILE IRR 1000ML POUR (IV SOLUTION) ×1 IMPLANT

## 2023-01-11 NOTE — ED Provider Notes (Signed)
B and E EMERGENCY DEPARTMENT AT [facility] Provider Note   CSN: [id number] Arrival date & time:        History  Chief Complaint  Patient presents with   Fall   Neck Injury    Cameo Levings is a 67 y.o. female.  67 year old female with prior medical history as detailed below presents for evaluation.  Patient arrives from home with EMS transport.  Patient reports that she awoke this morning and went outside to let her dog out and to do some weeding in her garden.  She tripped on something in her garden and fell forward onto her forehead and face.  After the fall she experienced significant pain to the posterior neck and upper back.  She reports that she was unable to stand secondary to pain.  She was able to get to her phone and call her daughter who called EMS.  Patient complains of significant pain to the posterior neck and upper shoulders radiating to both upper extremities on arrival.  She is able to move both upper extremities.  She is able to move both lower extremities.  She denies pain or injury to the chest, abdomen, pelvis, lower extremities.  She does have a history of chronic pain.  The history is provided by the patient and medical records.       Home Medications Prior to Admission medications   Medication Sig Start Date End Date Taking? Authorizing Provider  Alcohol Swabs (DROPSAFE ALCOHOL PREP) 70 % PADS USE TWICE DAILY 08/15/22   Celine Mans, MD  aspirin EC 81 MG tablet Take 1 tablet (81 mg total) by mouth daily. Swallow whole. 03/17/22   Celine Mans, MD  atorvastatin (LIPITOR) 80 MG tablet Take 1 tablet (80 mg total) by mouth daily. 08/29/22   Celine Mans, MD  cholecalciferol (VITAMIN D3) 25 MCG (1000 UNIT) tablet Take 1,000 Units by mouth daily.    [provider]  empagliflozin (JARDIANCE) 10 MG TABS tablet Take 1 tablet (10 mg total) by mouth daily. 06/14/22   Celine Mans, MD  fluticasone [facility]) 50 MCG/ACT  nasal spray USE 2 SPRAYS IN [facility] NOSTRIL EVERY DAY 12/07/22   Celine Mans, MD  glucose blood test strip Check blood sugars three times a day. 08/15/22   Celine Mans, MD  glucose monitoring kit (FREESTYLE) monitoring kit Check blood sugars three times a day 10/14/21   Shirlean Mylar, MD  hydrochlorothiazide (HYDRODIURIL) 25 MG tablet TAKE 1 TABLET EVERY DAY (NEED MD APPOINTMENT FOR REFILLS) 08/22/22   Celine Mans, MD  Lancets Misc. Roma Kayser 2 NORMAL) MISC One touch verio lancets ICD-9 250.00 08/23/22   Celine Mans, MD  metFORMIN (GLUCOPHAGE) 1000 MG tablet TAKE 1 TABLET TWICE DAILY WITH MEALS (NEED MD APPOINTMENT) 08/08/22   Glendale Chard, DO  naproxen sodium (ALEVE) 220 MG tablet Take 220 mg by mouth daily as needed (pain).    [provider]  olmesartan (BENICAR) 20 MG tablet Take 1 tablet (20 mg total) by mouth at bedtime. 07/13/22   Celine Mans, MD  oxyCODONE-acetaminophen (PERCOCET) 5-325 MG tablet Take 1 tablet by mouth 2 (two) times daily as needed for severe pain. Interim until mail prescription arrives. 11/01/22 11/01/23  Celine Mans, MD  oxyCODONE-acetaminophen (PERCOCET/ROXICET) 5-325 MG tablet Take 1 tablet by mouth 2 (two) times daily as needed for severe pain. Please call to schedule visit in 3 months. 12/26/22   Celine Mans, MD  Polyethyl Glycol-Propyl Glycol (SYSTANE OP) Place 2 drops into both eyes daily as needed (  dry eyes).    [provider]  TRUEplus Lancets 33G MISC USE AS DIRECTED 10/17/22   Celine Mans, MD  vitamin B-12 (CYANOCOBALAMIN) 1000 MCG tablet Take 1,000 mcg by mouth daily.    [provider]  zinc sulfate 220 (50 Zn) MG capsule Take 220 mg by mouth daily.    [provider]      Allergies    Amoxicillin-pot clavulanate and Tape    Review of Systems   Review of Systems  All other systems reviewed and are negative.   Physical Exam Updated Vital Signs BP (!) 148/67   Pulse 79   Temp 98.3 F (36.8  C) (Oral)   Resp 12   Ht 5\' 4"  (1.626 m)   Wt 54 kg   LMP 08/11/2011   SpO2 100%   BMI 20.43 kg/m  Physical Exam Vitals and nursing note reviewed.  Constitutional:      General: She is not in acute distress.    Appearance: Normal appearance. She is well-developed.  HENT:     Head: Normocephalic and atraumatic.  Eyes:     Conjunctiva/sclera: Conjunctivae normal.     Pupils: Pupils are equal, round, and reactive to light.  Neck:     Comments: Cervical collar in place.  Patient complains of significant tenderness over the entire posterior cervical spine and extending into the upper thoracic spine.  Patient reports this pain radiates into both upper extremities.  She complains of increased pain with movement of her upper extremities.  Distal bilateral upper extremities are 5 out of 5 strength.  Both lower extremities with 5 out of 5 strength. Cardiovascular:     Rate and Rhythm: Normal rate and regular rhythm.     Heart sounds: Normal heart sounds.  Pulmonary:     Effort: Pulmonary effort is normal. No respiratory distress.     Breath sounds: Normal breath sounds.  Abdominal:     General: There is no distension.     Palpations: Abdomen is soft.     Tenderness: There is no abdominal tenderness.  Musculoskeletal:        General: No deformity. Normal range of motion.  Skin:    General: Skin is warm and dry.  Neurological:     General: No focal deficit present.     Mental Status: She is alert and oriented to person, place, and time.     ED Results / Procedures / Treatments   Labs (all labs ordered are listed, but only abnormal results are displayed) Labs Reviewed  CBC WITH DIFFERENTIAL/PLATELET  COMPREHENSIVE METABOLIC PANEL  ETHANOL  I-STAT CHEM 8, ED  TYPE AND SCREEN    EKG EKG Interpretation Date/Time:  Wednesday January 11 2023 08:12:25 EDT Ventricular Rate:  78 PR Interval:  161 QRS Duration:  75 QT Interval:  379 QTC Calculation: 432 R Axis:   49  Text  Interpretation: Sinus rhythm Confirmed by Kristine Royal [phone]) on 01/11/2023 8:16:18 AM  Radiology No results found.  Procedures Procedures    Medications Ordered in ED Medications  morphine (PF) 4 MG/ML injection 4 mg (has no administration in time range)  ondansetron (ZOFRAN) injection 4 mg (has no administration in time range)    ED Course/ Medical Decision Making/ A&P                             Medical Decision Making Amount and/or Complexity of Data Reviewed Labs: ordered. Radiology: ordered.  Risk Prescription drug management. Decision regarding hospitalization.    Medical Screen Complete  This patient presented to the ED with complaint of fall, neck pain.  This complaint involves an extensive number of treatment options. The initial differential diagnosis includes, but is not limited to, trauma related to fall, metabolic abnormality, etc.  This presentation is: Acute, Self-Limited, Previously Undiagnosed, Uncertain Prognosis, Complicated, Systemic Symptoms, and Threat to Life/Bodily Function  Patient with fall from standing this morning.  Patient with significant neck pain post fall.  Patient describes radiation of pain from the midline to both shoulders and particularly into the right upper extremity.  CT imaging obtained is without significant acute pathology such as fracture.  MRI imaging of C-spine revealed cord compression at C3-4 with signal.    Case discussed with neurosurgery, Dr. Franky Macho.  After evaluation in the ED, Dr. Franky Macho feels the patient would benefit from surgical intervention.    Additional history obtained: External records from outside sources obtained and reviewed including prior ED visits and prior Inpatient records.    Lab Tests:  I ordered and personally interpreted labs.   Imaging Studies ordered:  I ordered imaging studies including CT head, CT C-spine, CT thoracic spine, MRI C-spine I independently visualized and  interpreted obtained imaging which showed cord compression at C3-4 I agree with the radiologist interpretation.   Cardiac Monitoring:  The patient was maintained on a cardiac monitor.  I personally viewed and interpreted the cardiac monitor which showed an underlying rhythm of: NSR   Medicines ordered:  I ordered medication including Dilaudid for pain Reevaluation of the patient after these medicines showed that the patient: improved   Problem List / ED Course:  Fall, neck pain, C3/4 cord compression   Reevaluation:  After the interventions noted above, I reevaluated the patient and found that they have: stayed the same  Disposition:  After consideration of the diagnostic results and the patients response to treatment, I feel that the patent would benefit from admission.   CRITICAL CARE Performed by: Wynetta Fines   Total critical care time: 30 minutes  Critical care time was exclusive of separately billable procedures and treating other patients.  Critical care was necessary to treat or prevent imminent or life-threatening deterioration.  Critical care was time spent personally by me on the following activities: development of treatment plan with patient and/or surrogate as well as nursing, discussions with consultants, evaluation of patient's response to treatment, examination of patient, obtaining history from patient or surrogate, ordering and performing treatments and interventions, ordering and review of laboratory studies, ordering and review of radiographic studies, pulse oximetry and re-evaluation of patient's condition.          Final Clinical Impression(s) / ED Diagnoses Final diagnoses:  Neck pain  Fall, initial encounter    Rx / DC Orders ED Discharge Orders     None         Wynetta Fines, MD 01/11/23 [phone]

## 2023-01-11 NOTE — ED Notes (Signed)
Xray notified Pt is ready.  °

## 2023-01-11 NOTE — Progress Notes (Signed)
Pt admitted from PACU S/P Neck Surgery, pt alert, soft spoken but follows command, settled in bed with call at bedside, /co of soreness all over, pt reassured and will continue to monitor, safety concern explained and initiated, v/s stable. [name], [name] [name] [name]

## 2023-01-11 NOTE — Anesthesia Procedure Notes (Signed)
Procedure Name: Intubation Date/Time: 01/11/2023 3:30 PM  Performed by: Rosiland Oz, CRNAPre-anesthesia Checklist: Patient identified, Emergency Drugs available, Suction available, Patient being monitored and Timeout performed Patient Re-evaluated:Patient Re-evaluated prior to induction Oxygen Delivery Method: Circle system utilized Preoxygenation: Pre-oxygenation with 100% oxygen Induction Type: IV induction, Rapid sequence and Cricoid Pressure applied Laryngoscope Size: Glidescope and 3 Grade View: Grade I Tube type: Oral Tube size: 7.0 mm Number of attempts: 1 Airway Equipment and Method: Stylet Placement Confirmation: ETT inserted through vocal cords under direct vision, positive ETCO2 and breath sounds checked- equal and bilateral Secured at: 21 cm Tube secured with: Tape Dental Injury: Teeth and Oropharynx as per pre-operative assessment

## 2023-01-11 NOTE — ED Triage Notes (Signed)
Per EMS, Pt, from home, c/o neck and BUE pain following a trip and fall this morning.  Pain score 10/10.  Limited ROM and decreased sensation noted in BUEs.  Denies LOC.

## 2023-01-11 NOTE — Op Note (Signed)
01/11/2023  5:54 PM  PATIENT:  Robin [name]  67 y.o. female presents with weakness in the right upper extremity and severe pain after falling today and striking her head. MRI showed cervical stenosis at C3/4 and associated cord signal.   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 Sarasin was taken to the operating room, intubated, and placed under general anesthesia without difficulty. She was positioned supine with her head in slight extension on a horseshoe headrest. The neck was prepped and draped in a sterile manner. I infiltrated 3 cc's 1/2%lidocaine/1:200,000 strength epinephrine into the planned incision starting from the midline to the medial border of the left sternocleidomastoid muscle. I opened the incision with a 10 blade and dissected sharply through soft tissue to the platysma. I dissected in the plane superior to the platysma both rostrally and caudally. I then opened the platysma in a horizontal fashion with Metzenbaum scissors, and dissected in the inferior plane rostrally and caudally. With both blunt and sharp technique I created an avascular corridor to the cervical spine. I placed a spinal needle(s) in the disc space at 3/4 . I then reflected the longus colli from C3 to C4 and placed self retaining retractors. I opened the disc space(s) at 3/4 with a 15 blade. I removed disc with curettes, Kerrison punches, and the drill. Using the drill I removed osteophytes  and prepared for the decompression.  I decompressed the spinal canal and the C4 root(s) with the drill, Kerrison punches, and the curettes. I used the microscope to aid in microdissection. I removed the posterior longitudinal ligament to fully expose and decompress the thecal sac. I exposed the roots laterally taking down the 3/4 uncovertebral joints. With the decompression complete I moved on to the arthrodesis. I used the drill to level the surfaces of C3 and 4. I removed soft tissue to prepare the disc space and the bony surfaces. I measured the space and placed a 7mm structural allograft into the disc space.  I then placed the anterior instrumentation. I placed 2 screws in each vertebral body through the plate. I locked the screws into place. Intraoperative xray showed the graft, plate, and screws to be in good position. I irrigated the wound, achieved hemostasis, and closed the wound in layers. I approximated the platysma, and the subcuticular plane with vicryl sutures. I used Dermabond for a sterile dressing.   PLAN OF CARE: Admit to inpatient   PATIENT DISPOSITION:  PACU - hemodynamically stable.   Delay start of Pharmacological VTE agent (>24hrs) due to surgical blood loss or risk of bleeding:  yes

## 2023-01-11 NOTE — Anesthesia Preprocedure Evaluation (Addendum)
Anesthesia Evaluation  Patient identified by MRN, date of birth, ID band Patient awake    Reviewed: Allergy & Precautions, NPO status , Patient's Chart, lab work & pertinent test results, reviewed documented beta blocker date and time   Airway Mallampati: II  TM Distance: >3 FB Neck ROM: Limited    Dental  (+) Edentulous Lower, Edentulous Upper   Pulmonary asthma , former smoker   Pulmonary exam normal breath sounds clear to auscultation       Cardiovascular hypertension, Pt. on medications + Peripheral Vascular Disease   Rhythm:Regular Rate:Normal     Neuro/Psych negative neurological ROS  negative psych ROS   GI/Hepatic Neg liver ROS,GERD  Medicated,,  Endo/Other  diabetes, Poorly Controlled, Type 2, Oral Hypoglycemic Agents  Hyperlipidemia  Renal/GU negative Renal ROS  negative genitourinary   Musculoskeletal Neck pain   Abdominal Normal abdominal exam  (+)   Peds  Hematology negative hematology ROS (+)   Anesthesia Other Findings   Reproductive/Obstetrics                             Anesthesia Physical Anesthesia Plan  ASA: 3  Anesthesia Plan: General   Post-op Pain Management: Dilaudid IV, Ketamine IV*, Lidocaine infusion* and Ofirmev IV (intra-op)*   Induction: Intravenous and Cricoid pressure planned  PONV Risk Score and Plan: 4 or greater and Treatment may vary due to age or medical condition and Ondansetron  Airway Management Planned: Oral ETT and Video Laryngoscope Planned  Additional Equipment: None  Intra-op Plan:   Post-operative Plan: Extubation in OR  Informed Consent: I have reviewed the patients History and Physical, chart, labs and discussed the procedure including the risks, benefits and alternatives for the proposed anesthesia with the patient or authorized representative who has indicated his/her understanding and acceptance.     Dental advisory  given  Plan Discussed with: CRNA and Anesthesiologist  Anesthesia Plan Comments:         Anesthesia Quick Evaluation
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
## 2023-01-11 NOTE — ED Notes (Signed)
Patient transported to MRI 

## 2023-01-11 NOTE — ED Notes (Signed)
Patient transported to CT 

## 2023-01-11 NOTE — ED Notes (Signed)
Pt continues to c/o 10/10 pain and continues to state she cannot move her R arm.

## 2023-01-11 NOTE — Transfer of Care (Signed)
Immediate Anesthesia Transfer of Care Note  Patient: Robin [name]  Procedure(s) Performed: ANTERIOR CERVICAL DECOMPRESSION/DISCECTOMY FUSION CERVICAL THREE-FOUR  Patient Location: PACU  Anesthesia Type:General  Level of Consciousness: drowsy  Airway & Oxygen Therapy: Patient Spontanous Breathing and Patient connected to face mask oxygen  Post-op Assessment: Report given to RN and Post -op Vital signs reviewed and stable  Post vital signs: Reviewed and stable  Last Vitals:  Vitals Value Taken Time  BP 152/68 01/11/23 1800  Temp    Pulse 104 01/11/23 1803  Resp 13 01/11/23 1803  SpO2 97 % 01/11/23 1803  Vitals shown include unvalidated device data.  Last Pain:  Vitals:   01/11/23 1456  TempSrc: Oral  PainSc: 10-Worst pain ever         Complications: No notable events documented.

## 2023-01-11 NOTE — Progress Notes (Signed)
Attempted to call husband and daughter [name] with phone numbers listed in EPIC.  No answer.

## 2023-01-12 LAB — GLUCOSE, CAPILLARY
Glucose-Capillary: 147 mg/dL — ABNORMAL HIGH (ref 70–99)
Glucose-Capillary: 174 mg/dL — ABNORMAL HIGH (ref 70–99)
Glucose-Capillary: 178 mg/dL — ABNORMAL HIGH (ref 70–99)
Glucose-Capillary: 186 mg/dL — ABNORMAL HIGH (ref 70–99)
Glucose-Capillary: 209 mg/dL — ABNORMAL HIGH (ref 70–99)
Glucose-Capillary: 219 mg/dL — ABNORMAL HIGH (ref 70–99)

## 2023-01-12 MED ORDER — DEXAMETHASONE SODIUM PHOSPHATE 4 MG/ML IJ SOLN
4.0000 mg | Freq: Three times a day (TID) | INTRAMUSCULAR | Status: DC
  Filled 2023-01-12 (×3): qty 1

## 2023-01-12 MED ORDER — GABAPENTIN 300 MG PO CAPS
300.0000 mg | ORAL_CAPSULE | Freq: Three times a day (TID) | ORAL | Status: DC
  Administered 2023-01-12 – 2023-01-17 (×15): 300 mg via ORAL
  Filled 2023-01-12 (×15): qty 1

## 2023-01-12 MED ORDER — ALUM & MAG HYDROXIDE-SIMETH 200-200-20 MG/5ML PO SUSP
30.0000 mL | Freq: Three times a day (TID) | ORAL | Status: DC | PRN
  Administered 2023-01-14 – 2023-01-17 (×2): 30 mL via ORAL
  Filled 2023-01-12 (×2): qty 30

## 2023-01-12 MED ORDER — DEXAMETHASONE 4 MG PO TABS
4.0000 mg | ORAL_TABLET | Freq: Three times a day (TID) | ORAL | Status: DC
  Administered 2023-01-12 – 2023-01-17 (×15): 4 mg via ORAL
  Filled 2023-01-12 (×15): qty 1

## 2023-01-12 NOTE — Plan of Care (Signed)

## 2023-01-12 NOTE — Progress Notes (Signed)
Inpatient Rehab Admissions Coordinator Note:   Per therapy recommendations patient was screened for CIR candidacy by Stephania Fragmin, PT. At this time, pt appears to be a potential candidate for CIR. I will place an order for rehab consult for full assessment, per our protocol.  Please contact me any with questions.Estill Dooms, PT, DPT [phone] 01/12/23 2:21 PM

## 2023-01-12 NOTE — Progress Notes (Signed)
Patient ID: Robin [name], female   DOB: [birth year], 67 y.o.   MRN: [id number] BP (!) 147/68 (BP Location: Left Arm)   Pulse 71   Temp 99 F (37.2 C) (Oral)   Resp 18   Ht 5\' 4"  (1.626 m)   Wt 54 kg   LMP 08/11/2011   SpO2 100%   BMI 20.43 kg/m  Alert and oriented x 4, speech is clear and fluent Moving right upper extremity better. 4/5 strength Wound is clean, dry, no signs of infection

## 2023-01-12 NOTE — TOC CAGE-AID Note (Signed)
Transition of Care [facility]) - CAGE-AID Screening   Patient Details  Name: Robin [name] MRN: [id number] Date of Birth: [birth year]  Transition of Care [facility]) CM/SW Contact:    Katha Hamming, RN Phone Number: 01/12/2023, 9:25 PM     CAGE-AID Screening:    Have You Ever Felt You Ought to Cut Down on Your Drinking or Drug Use?: No Have People Annoyed You By Critizing Your Drinking Or Drug Use?: No Have You Felt Bad Or Guilty About Your Drinking Or Drug Use?: No Have You Ever Had a Drink or Used Drugs First Thing In The Morning to Steady Your Nerves or to Get Rid of a Hangover?: No CAGE-AID Score: 0  Substance Abuse Education Offered: No (ocassionaly marijuana use, declines resources)

## 2023-01-12 NOTE — Evaluation (Signed)
Physical Therapy Evaluation Patient Details Name: Robin [name] MRN: [id number] DOB: 01/26/1956 Today's Date: 01/12/2023  History of Present Illness  Patient is 67 y.o. female s/p C3-4 ACDF on 01/11/23 after presenting to [facility] following a fall striking her head on the Lt and Rt UE weakness. MRI revealed cord compression at C3/4. PMH significant for asthma, DM, GERD, HLD, HTN, Rt RCR.   Clinical Impression  Robin [name] is 67 y.o. female admitted with above HPI and diagnosis. Patient is currently limited by functional impairments below (see PT problem list). Patient lives with spouse and is independent at baseline. Pt currently requires min assist for sit<>stand with pt holding therapist, no gait belt due to sensitivity of skin. Front facing HHA then Rt side HHA provided for gait with pt holding therapist with Rt arm around for support and eventually holding IV with Lt hand for extra support. Pt amb ~300' with no rest break required, Lt LE with limited hip flexion/dorsiflexion and decreased step length. Min assist to steady due to drift Rt. Patient will benefit from continued skilled PT interventions to address impairments and progress independence with mobility. Patient will benefit from intensive inpatient follow up therapy, >3 hours/day. Acute PT will follow and progress as able.         Assistance Recommended at Discharge Frequent or constant Supervision/Assistance  If plan is discharge home, recommend the following:  Can travel by private vehicle  A little help with walking and/or transfers;A little help with bathing/dressing/bathroom;Assistance with cooking/housework;Direct supervision/assist for medications management;Assist for transportation;Help with stairs or ramp for entrance        Equipment Recommendations  (TBD at next venue)  Recommendations for Other Services       Functional Status Assessment Patient has had a recent decline in their functional status and  demonstrates the ability to make significant improvements in function in a reasonable and predictable amount of time.     Precautions / Restrictions Precautions Precautions: Fall;Cervical Precaution Booklet Issued: No Precaution Comments: no brace per MD Restrictions Weight Bearing Restrictions: No      Mobility  Bed Mobility               General bed mobility comments: pt seated EOB with OT on PT arrival    Transfers Overall transfer level: Needs assistance Equipment used: 1 person hand held assist, 2 person hand held assist Transfers: Sit to/from Stand, Bed to chair/wheelchair/BSC Sit to Stand: +2 safety/equipment, Min assist   Step pivot transfers: +2 safety/equipment, Min assist       General transfer comment: min assist for sit<>stand, pt with good LE strength to power up, pt using Lt UE to press up from armrest of recliner, pt required HHA +2 to steady with rise from EOB.    Ambulation/Gait Ambulation/Gait assistance: Min assist, +2 safety/equipment Gait Distance (Feet): 300 Feet Assistive device: 1 person hand held assist, IV Pole Gait Pattern/deviations: Step-through pattern, Decreased step length - left, Decreased dorsiflexion - left, Decreased weight shift to left, Drifts right/left Gait velocity: decr     General Gait Details: Front facing HHA then Rt side HHA provided for gait with pt holding therapist with Rt arm around for support and eventually holding IV with Lt hand for extra support. Pt amb ~300' with no rest break required, Lt LE with limited hip flexion/dorsiflexion and decreased step length. Min assist to steady due to drift Rt.  Stairs            Psychologist, prison and probation services  Tilt Bed    Modified Rankin (Stroke Patients Only)       Balance Overall balance assessment: Needs assistance Sitting-balance support: Feet supported, Bilateral upper extremity supported Sitting balance-Leahy Scale: Fair     Standing balance support: Single  extremity supported, Bilateral upper extremity supported, During functional activity Standing balance-Leahy Scale: Fair                               Pertinent Vitals/Pain Pain Assessment Pain Assessment: Faces Faces Pain Scale: Hurts even more Pain Location: bil shoulders and hypersensitivity in Rt>Lt UE Pain Descriptors / Indicators: Aching, Discomfort Pain Intervention(s): Limited activity within patient's tolerance, Monitored during session, Repositioned    Home Living Family/patient expects to be discharged to:: Private residence Living Arrangements: Spouse/significant other;Children Available Help at Discharge: Family;Available 24 hours/day Type of Home: House Home Access: Stairs to enter Entrance Stairs-Rails: Can reach both Entrance Stairs-Number of Steps: 3   Home Layout: One level Home Equipment: None Additional Comments: taking care of her husband    Prior Function Prior Level of Function : Independent/Modified Independent;Working/employed;Driving                     Hand Dominance   Dominant Hand: Right    Extremity/Trunk Assessment   Upper Extremity Assessment Upper Extremity Assessment: RUE deficits/detail;LUE deficits/detail RUE Deficits / Details: Hypersenstive to touch and clothing 1+-2/5 for hand to elbow, shoulder 1/5, stiffness in hand that could be fairly easily worked out, wrist drop when she attempts to pick arm up off her lap,shoulder PROM ~90 degrees tolerated otherwise too painful RUE Coordination: decreased fine motor;decreased gross motor LUE Deficits / Details: Hypersenstive to touch and clothing, elbow to hand movement 3/5; shoulder PROM ~90 degrees tolerated otherwise too painful LUE Coordination: decreased fine motor;decreased gross motor    Lower Extremity Assessment Lower Extremity Assessment: Overall WFL for tasks assessed;LLE deficits/detail LLE Deficits / Details: Lt LE dragging slightly in gait, limited hip  flex/dorsiflexion    Cervical / Trunk Assessment Cervical / Trunk Assessment: Neck Surgery  Communication   Communication: No difficulties  Cognition Arousal/Alertness: Awake/alert Behavior During Therapy: WFL for tasks assessed/performed Overall Cognitive Status: Within Functional Limits for tasks assessed                                          General Comments      Exercises     Assessment/Plan    PT Assessment Patient needs continued PT services  PT Problem List Decreased strength;Decreased activity tolerance;Decreased balance;Decreased mobility;Decreased knowledge of use of DME;Decreased knowledge of precautions;Pain;Impaired sensation       PT Treatment Interventions DME instruction;Patient/family education;Cognitive remediation;Neuromuscular re-education;Balance training;Therapeutic exercise;Therapeutic activities;Functional mobility training;Stair training;Gait training    PT Goals (Current goals can be found in the Care Plan section)  Acute Rehab PT Goals Patient Stated Goal: regain strength and independence PT Goal Formulation: With patient Time For Goal Achievement: 01/26/23 Potential to Achieve Goals: Good    Frequency Min 5X/week     Co-evaluation   Reason for Co-Treatment: For patient/therapist safety;To address functional/ADL transfers PT goals addressed during session: Mobility/safety with mobility;Balance;Proper use of DME;Strengthening/ROM OT goals addressed during session: ADL's and self-care;Strengthening/ROM       AM-PAC PT "6 Clicks" Mobility  Outcome Measure Help needed turning from your back to your side while  in a flat bed without using bedrails?: A Little Help needed moving from lying on your back to sitting on the side of a flat bed without using bedrails?: A Little Help needed moving to and from a bed to a chair (including a wheelchair)?: A Little Help needed standing up from a chair using your arms (e.g., wheelchair or  bedside chair)?: A Little Help needed to walk in hospital room?: A Little Help needed climbing 3-5 steps with a railing? : A Lot 6 Click Score: 17    End of Session Equipment Utilized During Treatment: Gait belt Activity Tolerance: Patient tolerated treatment well Patient left: in chair;with call bell/phone within reach;with chair alarm set;with family/visitor present Nurse Communication: Mobility status PT Visit Diagnosis: Unsteadiness on feet (R26.81);Other abnormalities of gait and mobility (R26.89);Muscle weakness (generalized) (M62.81);Difficulty in walking, not elsewhere classified (R26.2);Other symptoms and signs involving the nervous system (R29.898)    Time: 1610-9604 PT Time Calculation (min) (ACUTE ONLY): 28 min   Charges:   PT Evaluation $PT Eval Moderate Complexity: 1 Mod   PT General Charges $$ ACUTE PT VISIT: 1 Visit         Wynn Maudlin, DPT Acute Rehabilitation Services Office [phone]  01/12/23 12:32 PM

## 2023-01-12 NOTE — Plan of Care (Signed)
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
## 2023-01-13 ENCOUNTER — Encounter (HOSPITAL_COMMUNITY): Payer: Self-pay | Admitting: Neurosurgery

## 2023-01-13 DIAGNOSIS — S14121D Central cord syndrome at C1 level of cervical spinal cord, subsequent encounter: Secondary | ICD-10-CM

## 2023-01-13 DIAGNOSIS — M5010 Cervical disc disorder with radiculopathy, unspecified cervical region: Secondary | ICD-10-CM

## 2023-01-13 LAB — GLUCOSE, CAPILLARY
Glucose-Capillary: 152 mg/dL — ABNORMAL HIGH (ref 70–99)
Glucose-Capillary: 161 mg/dL — ABNORMAL HIGH (ref 70–99)
Glucose-Capillary: 171 mg/dL — ABNORMAL HIGH (ref 70–99)
Glucose-Capillary: 190 mg/dL — ABNORMAL HIGH (ref 70–99)
Glucose-Capillary: 208 mg/dL — ABNORMAL HIGH (ref 70–99)
Glucose-Capillary: 218 mg/dL — ABNORMAL HIGH (ref 70–99)

## 2023-01-13 MED FILL — Thrombin For Soln 5000 Unit: CUTANEOUS | Qty: 2 | Status: AC

## 2023-01-13 NOTE — Progress Notes (Signed)
Inpatient Rehab Admissions Coordinator:    I met with pt. To discuss potential CIR admit. Pt. Is interested, Daughter can provide 24/7 support. I will send case to insurance and pursue for auth.   Megan Salon, MS, CCC-SLP Rehab Admissions Coordinator  [phone] (celll) [phone] (office)

## 2023-01-13 NOTE — Progress Notes (Signed)
On 7/4 Requested assistance from [facility] in contacting [facility] to get CBG and insulin orders because patient has DM2 hx and is on decadron. Decadron orders changed from q6 to TID, No other orders given. Attempted again today 7/5 to contact [facility] regarding patient CBG orders because patient daughter is concerned about patient Blood sugar given hx and decadron administration. No new orders received at this time.

## 2023-01-13 NOTE — TOC CM/SW Note (Signed)
Transition of Care [facility]) - Inpatient Brief Assessment   Patient Details  Name: Meshia Crapps MRN: [id number] Date of Birth: November 03, 1955  Transition of Care [facility]) CM/SW Contact:    Baldemar Lenis, LCSW Phone Number: 01/13/2023, 10:41 AM   Clinical Narrative:   Patient from home with spouse, current recommendation is CIR, pending workup. CSW to follow for disposition.    Transition of Care Asessment: Insurance and Status: Insurance coverage has been reviewed Patient has primary care physician: Yes Home environment has been reviewed: Home with spouse Prior level of function:: Independent Prior/Current Home Services: No current home services Social Determinants of Health Reivew: SDOH reviewed no interventions necessary Readmission risk has been reviewed: Yes Transition of care needs: transition of care needs identified, TOC will continue to follow

## 2023-01-13 NOTE — Anesthesia Postprocedure Evaluation (Signed)
Anesthesia Post Note  Patient: Robin [name]  Procedure([name]) Performed: ANTERIOR CERVICAL DECOMPRESSION/DISCECTOMY FUSION CERVICAL THREE-FOUR     Patient location during evaluation: PACU Anesthesia Type: General Level of consciousness: awake and alert Pain management: pain level controlled Vital Signs Assessment: post-procedure vital signs reviewed and stable Respiratory status: spontaneous breathing, nonlabored ventilation, respiratory function stable and patient connected to nasal cannula oxygen Cardiovascular status: blood pressure returned to baseline and stable Postop Assessment: no apparent nausea or vomiting Anesthetic complications: no  No notable events documented.  Last Vitals:  Vitals:   01/13/23 0329 01/13/23 0744  BP: (!) 161/65 (!) 128/50  Pulse: 71 65  Resp: 16 16  Temp: 36.9 C 37.2 C  SpO2: 99% 99%    Last Pain:  Vitals:   01/13/23 0744  TempSrc: Oral  PainSc:                  Robin [name]

## 2023-01-13 NOTE — Progress Notes (Signed)
Patient ID: Robin [name], female   DOB: 23-Mar-1956, 67 y.o.   MRN: [id number] BP (!) 147/69 (BP Location: Left Arm)   Pulse (!) 57   Temp 97.9 F (36.6 C) (Oral)   Resp 18   Ht 5\' 4"  (1.626 m)   Wt 54 kg   LMP 08/11/2011   SpO2 100%   BMI 20.43 kg/m   Alert and oriented x 4 Up with walker, near independent Right upper extremity remains weak. Right hand unable to fully open Wound is clean,dry, no signs of infection Rehab is considering for CIR

## 2023-01-13 NOTE — Plan of Care (Signed)

## 2023-01-13 NOTE — Progress Notes (Signed)
Physical Therapy Treatment Patient Details Name: Robin [name] MRN: [id number] DOB: 01/14/1956 Today's Date: 01/13/2023   History of Present Illness Patient is 67 y.o. female s/p C3-4 ACDF on 01/11/23 after presenting to [facility] following a fall striking her head on the Lt and Rt UE weakness. MRI revealed cord compression at C3/4. PMH significant for asthma, DM, GERD, HLD, HTN, Rt RCR.    PT Comments  Patient resting in bed and agreeable to therapy, able to move supine>sit with min guard and increased use of Lt UE on bed rail. Cues to use bil UE for scooting EOB and pt able to place Rt hand next to hip but limited support with press-up to scoot. Sit<>stand with min assist providing HHA on Rt for steadying. Pt ambulated ~120' with HHA only then ~180' with SPC in Lt UE. Pt continues to have slight left drift with gait, min assist to guide gait in straight path. EOS pt completed seated LE exercises at EOB and returned to supine to rest. Pillows repositioned for comfort and hot packs provided for ongoing shoulder pain. Will continue to progress as able.    Assistance Recommended at Discharge Frequent or constant Supervision/Assistance  If plan is discharge home, recommend the following:  Can travel by private vehicle    A little help with walking and/or transfers;A little help with bathing/dressing/bathroom;Assistance with cooking/housework;Direct supervision/assist for medications management;Assist for transportation;Help with stairs or ramp for entrance      Equipment Recommendations   (TBD at next venue)    Recommendations for Other Services       Precautions / Restrictions Precautions Precautions: Fall;Cervical Precaution Comments: no brace per MD Restrictions Weight Bearing Restrictions: No     Mobility  Bed Mobility Overal bed mobility: Needs Assistance Bed Mobility: Supine to Sit, Sit to Supine     Supine to sit: Min guard, HOB elevated Sit to supine: Min guard, HOB  elevated   General bed mobility comments: guarding for safety, pt using bed features of rail and HOB elevated. cues to use bil UE for scooting to EOB as pt using Lt only initially.    Transfers Overall transfer level: Needs assistance Equipment used: 1 person hand held assist, 2 person hand held assist Transfers: Sit to/from Stand Sit to Stand: Min assist           General transfer comment: min assist for sit<>stand, pt with good LE strength to power up from EOB and using Lt UE on bed rail to press up. HHA on Rt for steadying in standing.    Ambulation/Gait Ambulation/Gait assistance: Min assist, +2 safety/equipment Gait Distance (Feet): 300 Feet Assistive device: 1 person hand held assist, Straight cane Gait Pattern/deviations: Step-through pattern, Decreased step length - left, Drifts right/left, Decreased dorsiflexion - left Gait velocity: decr     General Gait Details: Rt HHA with pt using SPC in Lt hand for steadying gait. no buckling and pt with improved DF on bil LE's. pt continues to drift Lt slightly but able to steady with cane. min cues for sequencing cane with Rt LE.   Stairs             Wheelchair Mobility     Tilt Bed    Modified Rankin (Stroke Patients Only)       Balance Overall balance assessment: Needs assistance Sitting-balance support: Feet supported, Bilateral upper extremity supported Sitting balance-Leahy Scale: Fair     Standing balance support: Single extremity supported, Bilateral upper extremity supported, During functional activity Standing balance-Leahy  Scale: Fair                              Cognition Arousal/Alertness: Awake/alert Behavior During Therapy: WFL for tasks assessed/performed Overall Cognitive Status: Within Functional Limits for tasks assessed                                          Exercises General Exercises - Lower Extremity Long Arc Quad: Both, 10 reps, Seated Hip  ABduction/ADduction: Both, 10 reps, Seated Hip Flexion/Marching: Both, 10 reps, Seated    General Comments        Pertinent Vitals/Pain Pain Assessment Faces Pain Scale: Hurts even more Pain Location: bil shoulders and hypersensitivity in Rt>Lt UE Pain Descriptors / Indicators: Aching, Discomfort    Home Living                          Prior Function            PT Goals (current goals can now be found in the care plan section) Acute Rehab PT Goals Patient Stated Goal: regain strength and independence PT Goal Formulation: With patient Time For Goal Achievement: 01/26/23 Potential to Achieve Goals: Good Progress towards PT goals: Progressing toward goals    Frequency    Min 5X/week      PT Plan      Co-evaluation              AM-PAC PT "6 Clicks" Mobility   Outcome Measure  Help needed turning from your back to your side while in a flat bed without using bedrails?: A Little Help needed moving from lying on your back to sitting on the side of a flat bed without using bedrails?: A Little Help needed moving to and from a bed to a chair (including a wheelchair)?: A Little Help needed standing up from a chair using your arms (e.g., wheelchair or bedside chair)?: A Little Help needed to walk in hospital room?: A Little Help needed climbing 3-5 steps with a railing? : A Lot 6 Click Score: 17    End of Session Equipment Utilized During Treatment: Gait belt Activity Tolerance: Patient tolerated treatment well Patient left: in chair;with call bell/phone within reach;with chair alarm set;with family/visitor present Nurse Communication: Mobility status PT Visit Diagnosis: Unsteadiness on feet (R26.81);Other abnormalities of gait and mobility (R26.89);Muscle weakness (generalized) (M62.81);Difficulty in walking, not elsewhere classified (R26.2);Other symptoms and signs involving the nervous system (R29.898)     Time: 1610-9604 PT Time Calculation (min)  (ACUTE ONLY): 26 min  Charges:    $Gait Training: 8-22 mins $Therapeutic Activity: 8-22 mins PT General Charges $$ ACUTE PT VISIT: 1 Visit                     Wynn Maudlin, DPT Acute Rehabilitation Services Office [phone]  01/13/23 3:53 PM
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
## 2023-01-14 LAB — GLUCOSE, CAPILLARY
Glucose-Capillary: 171 mg/dL — ABNORMAL HIGH (ref 70–99)
Glucose-Capillary: 203 mg/dL — ABNORMAL HIGH (ref 70–99)
Glucose-Capillary: 208 mg/dL — ABNORMAL HIGH (ref 70–99)
Glucose-Capillary: 219 mg/dL — ABNORMAL HIGH (ref 70–99)
Glucose-Capillary: 221 mg/dL — ABNORMAL HIGH (ref 70–99)
Glucose-Capillary: 241 mg/dL — ABNORMAL HIGH (ref 70–99)
Glucose-Capillary: 303 mg/dL — ABNORMAL HIGH (ref 70–99)

## 2023-01-14 NOTE — Progress Notes (Signed)
Occupational Therapy Treatment Patient Details Name: Robin [name] MRN: [id number] DOB: Jul 12, 1955 Today's Date: 01/14/2023   History of present illness Patient is 67 y.o. female s/p C3-4 ACDF on 01/11/23 after presenting to [facility] following a fall striking her head on the Lt and Rt UE weakness. MRI revealed cord compression at C3/4. PMH significant for asthma, DM, GERD, HLD, HTN, Rt RCR.   OT comments  Patient up in recliner feeding self with built up utensils with red foam. Patient stating difficulty managing utensils and new weighted built up utensils provided. Patient stated improvement on feeding self with LUE but continues to have difficulty with using RUE. Patient instructed in AAROM exercises for finger extension and wrist flexion/extension exercises and yellow foam provided to increase grasp. Patient will benefit from intensive inpatient follow up therapy, >3 hours/day to increase safety and independence with self care and functional transfers and increase BUE strength. Acute OT to continue to follow.    Recommendations for follow up therapy are one component of a multi-disciplinary discharge planning process, led by the attending physician.  Recommendations may be updated based on patient status, additional functional criteria and insurance authorization.    Assistance Recommended at Discharge Frequent or constant Supervision/Assistance  Patient can return home with the following  A lot of help with walking and/or transfers;A lot of help with bathing/dressing/bathroom;Assistance with cooking/housework;Assistance with feeding;Help with stairs or ramp for entrance;Assist for transportation   Equipment Recommendations  Other (comment) (TBD next venue)    Recommendations for Other Services      Precautions / Restrictions Precautions Precautions: Fall;Cervical Precaution Booklet Issued: No Precaution Comments: no brace per MD Restrictions Weight Bearing Restrictions: No        Mobility Bed Mobility Overal bed mobility: Needs Assistance             General bed mobility comments: OOB in recliner    Transfers                         Balance                                           ADL either performed or assessed with clinical judgement   ADL Overall ADL's : Needs assistance/impaired Eating/Feeding: Supervision/ safety;With adaptive utensils;Sitting Eating/Feeding Details (indicate cue type and reason): patient had red foam to build up utensil with difficulty manageing, Built up utencils provided with patient able to feed self with LUE but continues to have difficulty with right                                        Extremity/Trunk Assessment              Vision       Perception     Praxis      Cognition Arousal/Alertness: Awake/alert Behavior During Therapy: WFL for tasks assessed/performed Overall Cognitive Status: Within Functional Limits for tasks assessed                                          Exercises Exercises: Other exercises Other Exercises Other Exercises: yellow foam for RUE grasp/finger flexion, squeeze ball exercises for LUE  Other Exercises: AAROM for LUE finger extension and wrist flexion/extension    Shoulder Instructions       General Comments      Pertinent Vitals/ Pain       Pain Assessment Pain Assessment: 0-10 Pain Score: 8  Faces Pain Scale: Hurts even more Pain Location: BUE shoulders, neck, upper back Pain Descriptors / Indicators: Aching, Discomfort Pain Intervention(s): Limited activity within patient's tolerance, Monitored during session, Repositioned, RN gave pain meds during session  Home Living                                          Prior Functioning/Environment              Frequency  Min 1X/week        Progress Toward Goals  OT Goals(current goals can now be found in the care plan  section)  Progress towards OT goals: Progressing toward goals  Acute Rehab OT Goals Patient Stated Goal: get better and less pain OT Goal Formulation: With patient Time For Goal Achievement: 01/26/23 Potential to Achieve Goals: Good ADL Goals Pt Will Perform Grooming: with modified independence;standing Pt Will Perform Upper Body Bathing: Independently;sitting Pt Will Perform Lower Body Bathing: with modified independence;sit to/from stand Pt Will Transfer to Toilet: with modified independence;ambulating;bedside commode Pt Will Perform Toileting - Clothing Manipulation and hygiene: with modified independence;sit to/from stand Pt/caregiver will Perform Home Exercise Program: Increased ROM;Increased strength;Both right and left upper extremity;With written HEP provided;Independently  Plan Discharge plan remains appropriate    Co-evaluation                 AM-PAC OT "6 Clicks" Daily Activity     Outcome Measure   Help from another person eating meals?: A Little Help from another person taking care of personal grooming?: A Lot Help from another person toileting, which includes using toliet, bedpan, or urinal?: A Lot Help from another person bathing (including washing, rinsing, drying)?: A Lot Help from another person to put on and taking off regular upper body clothing?: A Lot Help from another person to put on and taking off regular lower body clothing?: A Lot 6 Click Score: 13    End of Session    OT Visit Diagnosis: Unsteadiness on feet (R26.81);Other abnormalities of gait and mobility (R26.89);Muscle weakness (generalized) (M62.81);Pain Pain - Right/Left: Right Pain - part of body: Shoulder (and left)   Activity Tolerance Patient tolerated treatment well   Patient Left in chair;with call bell/phone within reach;with chair alarm set;with nursing/sitter in room   Nurse Communication Mobility status        Time: 0828-0900 OT Time Calculation (min): 32  min  Charges: OT General Charges $OT Visit: 1 Visit OT Treatments $Self Care/Home Management : 8-22 mins $Therapeutic Exercise: 8-22 mins  Alfonse Flavors, OTA Acute Rehabilitation Services  Office [phone]   Dewain Penning 01/14/2023, 11:14 AM

## 2023-01-14 NOTE — Progress Notes (Signed)
   Providing Compassionate, Quality Care - Together  NEUROSURGERY PROGRESS NOTE   S: No issues overnight.   O: EXAM:  BP 120/70 (BP Location: Left Wrist)   Pulse (!) 56   Temp 98.7 F (37.1 C) (Oral)   Resp 16   Ht 5\' 4"  (1.626 m)   Wt 54 kg   LMP 08/11/2011   SpO2 100%   BMI 20.43 kg/m   Awake, alert, oriented x3 PERRL Speech fluent, appropriate  CNs grossly intact  Neck soft, normal phonation, trachea midline Right grip 3/5, finger extension 2/5 Left upper/bilateral lower extremities full strength  ASSESSMENT:  67 y.o. 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  Please do not hesitate to call with questions or concerns.   Monia Pouch, DO Neurosurgeon [facility] Cell: [phone]

## 2023-01-15 LAB — GLUCOSE, CAPILLARY
Glucose-Capillary: 188 mg/dL — ABNORMAL HIGH (ref 70–99)
Glucose-Capillary: 193 mg/dL — ABNORMAL HIGH (ref 70–99)
Glucose-Capillary: 210 mg/dL — ABNORMAL HIGH (ref 70–99)
Glucose-Capillary: 218 mg/dL — ABNORMAL HIGH (ref 70–99)
Glucose-Capillary: 222 mg/dL — ABNORMAL HIGH (ref 70–99)
Glucose-Capillary: 352 mg/dL — ABNORMAL HIGH (ref 70–99)

## 2023-01-15 MED ORDER — INSULIN ASPART 100 UNIT/ML IJ SOLN: 0.0000 [IU] | Freq: Three times a day (TID) | INTRAMUSCULAR | Status: DC

## 2023-01-15 MED ORDER — INSULIN ASPART 100 UNIT/ML IJ SOLN
0.0000 [IU] | Freq: Three times a day (TID) | INTRAMUSCULAR | Status: DC
  Administered 2023-01-15: 15 [IU] via SUBCUTANEOUS
  Administered 2023-01-16: 3 [IU] via SUBCUTANEOUS
  Administered 2023-01-16 – 2023-01-17 (×3): 5 [IU] via SUBCUTANEOUS
  Administered 2023-01-17: 8 [IU] via SUBCUTANEOUS

## 2023-01-15 NOTE — Progress Notes (Signed)
   Providing Compassionate, Quality Care - Together  NEUROSURGERY PROGRESS NOTE   S: No issues overnight.   O: EXAM:  BP (!) 116/99 (BP Location: Left Arm)   Pulse 60   Temp 98.6 F (37 C) (Oral)   Resp 20   Ht 5\' 4"  (1.626 m)   Wt 54 kg   LMP 08/11/2011   SpO2 100%   BMI 20.43 kg/m   Awake, alert, oriented x3 PERRL Speech fluent, appropriate  CNs grossly intact  Neck soft, normal phonation, trachea midline Right grip 3/5, finger extension 2/5 Left upper/bilateral lower extremities full strength   ASSESSMENT:  67 y.o. 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  Please do not hesitate to call with questions or concerns.   Monia Pouch, DO Neurosurgeon [facility] Cell: [phone]

## 2023-01-16 LAB — GLUCOSE, CAPILLARY
Glucose-Capillary: 209 mg/dL — ABNORMAL HIGH (ref 70–99)
Glucose-Capillary: 205 mg/dL — ABNORMAL HIGH (ref 70–99)
Glucose-Capillary: 178 mg/dL — ABNORMAL HIGH (ref 70–99)
Glucose-Capillary: 210 mg/dL — ABNORMAL HIGH (ref 70–99)
Glucose-Capillary: 170 mg/dL — ABNORMAL HIGH (ref 70–99)
Glucose-Capillary: 287 mg/dL — ABNORMAL HIGH (ref 70–99)

## 2023-01-16 NOTE — Plan of Care (Signed)
  Problem: Education: Goal: Ability to verbalize activity precautions or restrictions will improve Outcome: Progressing Goal: Knowledge of the prescribed therapeutic regimen will improve Outcome: Progressing Goal: Understanding of discharge needs will improve Outcome: Progressing   Problem: Activity: Goal: Ability to avoid complications of mobility impairment will improve Outcome: Progressing Goal: Ability to tolerate increased activity will improve Outcome: Progressing Goal: Will remain free from falls Outcome: Progressing   Problem: Bowel/Gastric: Goal: Gastrointestinal status for postoperative course will improve Outcome: Progressing   Problem: Clinical Measurements: Goal: Ability to maintain clinical measurements within normal limits will improve Outcome: Progressing Goal: Postoperative complications will be avoided or minimized Outcome: Progressing Goal: Diagnostic test results will improve Outcome: Progressing   Problem: Pain Management: Goal: Pain level will decrease Outcome: Progressing   Problem: Skin Integrity: Goal: Will show signs of wound healing Outcome: Progressing   Problem: Health Behavior/Discharge Planning: Goal: Identification of resources available to assist in meeting health care needs will improve Outcome: Progressing   Problem: Bladder/Genitourinary: Goal: Urinary functional status for postoperative course will improve Outcome: Progressing   Problem: Education: Goal: Knowledge of General Education information will improve Description: Including pain rating scale, medication(s)/side effects and non-pharmacologic comfort measures Outcome: Progressing   Problem: Health Behavior/Discharge Planning: Goal: Ability to manage health-related needs will improve Outcome: Progressing   Problem: Clinical Measurements: Goal: Ability to maintain clinical measurements within normal limits will improve Outcome: Progressing Goal: Will remain free from  infection Outcome: Progressing Goal: Diagnostic test results will improve Outcome: Progressing Goal: Respiratory complications will improve Outcome: Progressing Goal: Cardiovascular complication will be avoided Outcome: Progressing   Problem: Activity: Goal: Risk for activity intolerance will decrease Outcome: Progressing   Problem: Nutrition: Goal: Adequate nutrition will be maintained Outcome: Progressing   Problem: Coping: Goal: Level of anxiety will decrease Outcome: Progressing   Problem: Elimination: Goal: Will not experience complications related to bowel motility Outcome: Progressing Goal: Will not experience complications related to urinary retention Outcome: Progressing   Problem: Pain Managment: Goal: General experience of comfort will improve Outcome: Progressing   Problem: Safety: Goal: Ability to remain free from injury will improve Outcome: Progressing   Problem: Skin Integrity: Goal: Risk for impaired skin integrity will decrease Outcome: Progressing   Problem: Education: Goal: Ability to describe self-care measures that may prevent or decrease complications (Diabetes Survival Skills Education) will improve Outcome: Progressing Goal: Individualized Educational Video(s) Outcome: Progressing   Problem: Coping: Goal: Ability to adjust to condition or change in health will improve Outcome: Progressing   Problem: Fluid Volume: Goal: Ability to maintain a balanced intake and output will improve Outcome: Progressing   Problem: Health Behavior/Discharge Planning: Goal: Ability to identify and utilize available resources and services will improve Outcome: Progressing Goal: Ability to manage health-related needs will improve Outcome: Progressing   Problem: Metabolic: Goal: Ability to maintain appropriate glucose levels will improve Outcome: Progressing   Problem: Nutritional: Goal: Maintenance of adequate nutrition will improve Outcome:  Progressing Goal: Progress toward achieving an optimal weight will improve Outcome: Progressing   Problem: Skin Integrity: Goal: Risk for impaired skin integrity will decrease Outcome: Progressing   Problem: Tissue Perfusion: Goal: Adequacy of tissue perfusion will improve Outcome: Progressing   

## 2023-01-16 NOTE — Progress Notes (Signed)
Patient ID: Robin [name], female   DOB: March 28, 1956, 67 y.o.   MRN: [id number] BP 129/65 (BP Location: Right Wrist)   Pulse (!) 56   Temp 98 F (36.7 C) (Oral)   Resp 18   Ht 5\' 4"  (1.626 m)   Wt 54 kg   LMP 08/11/2011   SpO2 100%   BMI 20.43 kg/m  Alert and oriented x 4, speech is clear and fluent Right upper extremity movement is much improved Wound is clean, dry, no signs of infection

## 2023-01-16 NOTE — Progress Notes (Signed)
Inpatient Rehab Admissions Coordinator:   I received authorization from insurance for CIR but do not have an available bed for her today. I notified her and reviewed benefits. Will continue to follow for potential admit pending bed availability.   Megan Salon, MS, CCC-SLP Rehab Admissions Coordinator  [phone] (celll) [phone] (office)

## 2023-01-16 NOTE — Care Management Important Message (Signed)
Important Message  Patient Details  Name: Robin [name] MRN: [id number] Date of Birth: 05-Mar-1956   Medicare Important Message Given:  Yes     Alois Mincer Stefan Church 01/16/2023, 3:38 PM

## 2023-01-16 NOTE — TOC Progression Note (Signed)
Transition of Care [facility]) - Progression Note    Patient Details  Name: Robin [name] MRN: [id number] Date of Birth: [birth year]  [facility]) CM/SW Contact  Kermit Balo, RN Phone Number: 01/16/2023, 1:27 PM  Clinical Narrative:    Pt has received insurance auth for CIR. They dont have a bed today. Will plan for admit tomorrow if bed available.    Expected Discharge Plan: IP Rehab Facility    Expected Discharge Plan and Services                                               Social Determinants of Health (SDOH) Interventions SDOH Screenings   Food Insecurity: No Food Insecurity (01/11/2023)  Housing: Low Risk  (01/11/2023)  Transportation Needs: No Transportation Needs (01/11/2023)  Utilities: Not At Risk (01/11/2023)  Alcohol Screen: Low Risk  (11/26/2022)  Depression (PHQ2-9): Low Risk  (11/26/2022)  Financial Resource Strain: Low Risk  (11/26/2022)  Physical Activity: Insufficiently Active (11/26/2022)  Social Connections: Moderately Integrated (11/26/2022)  Stress: No Stress Concern Present (11/26/2022)  Tobacco Use: Medium Risk (01/13/2023)    Readmission Risk Interventions     No data to display

## 2023-01-16 NOTE — Progress Notes (Signed)
Physical Therapy Treatment Patient Details Name: Robin [name] MRN: [id number] DOB: [birth year] Today's Date: 01/16/2023   History of Present Illness Patient is 67 y.o. female s/p C3-4 ACDF on 01/11/23 after presenting to [facility] following a fall striking her head on the Lt and Rt UE weakness. MRI revealed cord compression at C3/4. PMH significant for asthma, DM, GERD, HLD, HTN, Rt RCR.    PT Comments  Pt received sitting EOB and reports she feels stiff and is ready to walk. Began with SPC L and HHA R but progressed to only SPC L though pt had several LOB to R needing mod A from gait belt to correct. Pt with discomfort B shoulders, pendulum swings in standing helped relieve some discomfort as well as working on balance. Pt reports mildly decreased pain end of session compared to beginning. PT will continue to follow.      Assistance Recommended at Discharge Frequent or constant Supervision/Assistance  If plan is discharge home, recommend the following:  Can travel by private vehicle    A little help with walking and/or transfers;A little help with bathing/dressing/bathroom;Assistance with cooking/housework;Direct supervision/assist for medications management;Assist for transportation;Help with stairs or ramp for entrance      Equipment Recommendations  Cane    Recommendations for Other Services       Precautions / Restrictions Precautions Precautions: Fall;Cervical Precaution Booklet Issued: No Precaution Comments: no brace per MD Restrictions Weight Bearing Restrictions: No     Mobility  Bed Mobility               General bed mobility comments: pt sitting up EOB    Transfers Overall transfer level: Needs assistance Equipment used: Straight cane Transfers: Sit to/from Stand Sit to Stand: Min guard   Step pivot transfers: Min assist       General transfer comment: min A while stepping    Ambulation/Gait Ambulation/Gait assistance: Min assist, Mod  assist Gait Distance (Feet): 250 Feet Assistive device: Straight cane Gait Pattern/deviations: Step-through pattern, Decreased step length - left, Drifts right/left, Decreased dorsiflexion - left Gait velocity: decr Gait velocity interpretation: 1.31 - 2.62 ft/sec, indicative of limited community ambulator   General Gait Details: began with L SPC and R HHA, progressed to L SPC only but had several LOB needing mod A from behind for safety. Pt working on trusting cane more as she is used to holding it on the right   Building services engineer [place] (Stroke Patients Only)       Balance Overall balance assessment: Needs assistance Sitting-balance support: Feet supported, Bilateral upper extremity supported Sitting balance-Leahy Scale: Fair     Standing balance support: Single extremity supported, Bilateral upper extremity supported, During functional activity Standing balance-Leahy Scale: Fair                              Cognition Arousal/Alertness: Awake/alert Behavior During Therapy: WFL for tasks assessed/performed Overall Cognitive Status: Within Functional Limits for tasks assessed                                          Exercises Other Exercises Other Exercises: pendulum arm swings in tandem stance working on balance as well as loosening tightness in B shoulders, 10x  R and 10x L Other Exercises: scapular retraction x10 Other Exercises: shoulder shrugs x10 Other Exercises: shoulder rolls x10 Other Exercises: AAROM RUE shoulder flexion x5    General Comments General comments (skin integrity, edema, etc.): VSS      Pertinent Vitals/Pain Pain Assessment Pain Assessment: Faces Faces Pain Scale: Hurts even more Pain Location: BUE shoulders, neck, upper back Pain Descriptors / Indicators: Aching, Discomfort Pain Intervention(s): Limited activity within patient's tolerance, Monitored during  session    Home Living                          Prior Function            PT Goals (current goals can now be found in the care plan section) Acute Rehab PT Goals Patient Stated Goal: regain strength and independence PT Goal Formulation: With patient Time For Goal Achievement: 01/26/23 Potential to Achieve Goals: Good Progress towards PT goals: Progressing toward goals    Frequency    Min 5X/week      PT Plan Current plan remains appropriate    Co-evaluation              AM-PAC PT "6 Clicks" Mobility   Outcome Measure  Help needed turning from your back to your side while in a flat bed without using bedrails?: A Little Help needed moving from lying on your back to sitting on the side of a flat bed without using bedrails?: A Little Help needed moving to and from a bed to a chair (including a wheelchair)?: A Little Help needed standing up from a chair using your arms (e.g., wheelchair or bedside chair)?: A Little Help needed to walk in hospital room?: A Little Help needed climbing 3-5 steps with a railing? : A Lot 6 Click Score: 17    End of Session Equipment Utilized During Treatment: Gait belt Activity Tolerance: Patient tolerated treatment well Patient left: in chair;with call bell/phone within reach Nurse Communication: Mobility status PT Visit Diagnosis: Unsteadiness on feet (R26.81);Other abnormalities of gait and mobility (R26.89);Muscle weakness (generalized) (M62.81);Difficulty in walking, not elsewhere classified (R26.2);Other symptoms and signs involving the nervous system (R29.898)     Time: 4098-1191 PT Time Calculation (min) (ACUTE ONLY): 33 min  Charges:    $Gait Training: 8-22 mins $Therapeutic Exercise: 8-22 mins PT General Charges $$ ACUTE PT VISIT: 1 Visit                     Lyanne Co, PT  Acute Rehab Services Secure chat preferred Office [phone]    Lawana Chambers Meridith Romick 01/16/2023, 3:23 PM

## 2023-01-17 ENCOUNTER — Inpatient Hospital Stay (HOSPITAL_COMMUNITY): Payer: Medicare HMO

## 2023-01-17 ENCOUNTER — Inpatient Hospital Stay (HOSPITAL_COMMUNITY)
Admission: RE | Admit: 2023-01-17 | Discharge: 2023-01-24 | DRG: 559 | Disposition: A | Discharge status: Home / Self Care | Payer: Medicare HMO | Source: Intra-hospital | Attending: Physical Medicine and Rehabilitation | Admitting: Physical Medicine and Rehabilitation

## 2023-01-17 DIAGNOSIS — W19XXXS Unspecified fall, sequela: Secondary | ICD-10-CM | POA: Diagnosis present

## 2023-01-17 DIAGNOSIS — G959 Disease of spinal cord, unspecified: Secondary | ICD-10-CM | POA: Diagnosis not present

## 2023-01-17 DIAGNOSIS — R739 Hyperglycemia, unspecified: Secondary | ICD-10-CM | POA: Diagnosis not present

## 2023-01-17 DIAGNOSIS — M62838 Other muscle spasm: Secondary | ICD-10-CM | POA: Diagnosis present

## 2023-01-17 DIAGNOSIS — S1989XD Other specified injuries of other specified part of neck, subsequent encounter: Principal | ICD-10-CM

## 2023-01-17 DIAGNOSIS — Z79899 Other long term (current) drug therapy: Secondary | ICD-10-CM

## 2023-01-17 DIAGNOSIS — K5900 Constipation, unspecified: Secondary | ICD-10-CM | POA: Diagnosis not present

## 2023-01-17 DIAGNOSIS — J4520 Mild intermittent asthma, uncomplicated: Secondary | ICD-10-CM | POA: Diagnosis present

## 2023-01-17 DIAGNOSIS — R531 Weakness: Secondary | ICD-10-CM | POA: Diagnosis present

## 2023-01-17 DIAGNOSIS — Z7984 Long term (current) use of oral hypoglycemic drugs: Secondary | ICD-10-CM | POA: Diagnosis not present

## 2023-01-17 DIAGNOSIS — I10 Essential (primary) hypertension: Secondary | ICD-10-CM | POA: Diagnosis present

## 2023-01-17 DIAGNOSIS — R252 Cramp and spasm: Secondary | ICD-10-CM | POA: Diagnosis not present

## 2023-01-17 DIAGNOSIS — G8250 Quadriplegia, unspecified: Secondary | ICD-10-CM | POA: Diagnosis present

## 2023-01-17 DIAGNOSIS — G992 Myelopathy in diseases classified elsewhere: Secondary | ICD-10-CM | POA: Diagnosis present

## 2023-01-17 DIAGNOSIS — M7552 Bursitis of left shoulder: Secondary | ICD-10-CM | POA: Diagnosis present

## 2023-01-17 DIAGNOSIS — E785 Hyperlipidemia, unspecified: Secondary | ICD-10-CM | POA: Diagnosis present

## 2023-01-17 DIAGNOSIS — Z87891 Personal history of nicotine dependence: Secondary | ICD-10-CM | POA: Diagnosis not present

## 2023-01-17 DIAGNOSIS — G4700 Insomnia, unspecified: Secondary | ICD-10-CM | POA: Diagnosis present

## 2023-01-17 DIAGNOSIS — Z7982 Long term (current) use of aspirin: Secondary | ICD-10-CM

## 2023-01-17 DIAGNOSIS — G9520 Unspecified cord compression: Secondary | ICD-10-CM | POA: Diagnosis present

## 2023-01-17 DIAGNOSIS — Z4789 Encounter for other orthopedic aftercare: Secondary | ICD-10-CM | POA: Diagnosis present

## 2023-01-17 DIAGNOSIS — M19012 Primary osteoarthritis, left shoulder: Secondary | ICD-10-CM | POA: Diagnosis present

## 2023-01-17 DIAGNOSIS — E119 Type 2 diabetes mellitus without complications: Secondary | ICD-10-CM | POA: Diagnosis present

## 2023-01-17 DIAGNOSIS — Z981 Arthrodesis status: Secondary | ICD-10-CM | POA: Diagnosis not present

## 2023-01-17 DIAGNOSIS — K5901 Slow transit constipation: Secondary | ICD-10-CM | POA: Diagnosis not present

## 2023-01-17 DIAGNOSIS — E1165 Type 2 diabetes mellitus with hyperglycemia: Secondary | ICD-10-CM

## 2023-01-17 LAB — HEMOGLOBIN A1C
Hgb A1c MFr Bld: 7.2 % — ABNORMAL HIGH (ref 4.8–5.6)
Mean Plasma Glucose: 160 mg/dL

## 2023-01-17 LAB — GLUCOSE, CAPILLARY
Glucose-Capillary: 217 mg/dL — ABNORMAL HIGH (ref 70–99)
Glucose-Capillary: 256 mg/dL — ABNORMAL HIGH (ref 70–99)
Glucose-Capillary: 202 mg/dL — ABNORMAL HIGH (ref 70–99)
Glucose-Capillary: 210 mg/dL — ABNORMAL HIGH (ref 70–99)

## 2023-01-17 MED ORDER — METFORMIN HCL 500 MG PO TABS
500.0000 mg | ORAL_TABLET | Freq: Two times a day (BID) | ORAL | Status: DC
  Administered 2023-01-17 – 2023-01-24 (×14): 500 mg via ORAL
  Filled 2023-01-17 (×14): qty 1

## 2023-01-17 MED ORDER — SENNA 8.6 MG PO TABS
1.0000 | ORAL_TABLET | Freq: Two times a day (BID) | ORAL | Status: DC
  Administered 2023-01-17 – 2023-01-24 (×14): 8.6 mg via ORAL
  Filled 2023-01-17 (×14): qty 1

## 2023-01-17 MED ORDER — DIPHENHYDRAMINE HCL 25 MG PO CAPS: 25.0000 mg | ORAL_CAPSULE | Freq: Four times a day (QID) | ORAL | Status: DC | PRN

## 2023-01-17 MED ORDER — DEXAMETHASONE SODIUM PHOSPHATE 4 MG/ML IJ SOLN
4.0000 mg | Freq: Three times a day (TID) | INTRAMUSCULAR | Status: DC
  Filled 2023-01-17: qty 1

## 2023-01-17 MED ORDER — ZINC SULFATE 220 (50 ZN) MG PO CAPS
220.0000 mg | ORAL_CAPSULE | Freq: Every day | ORAL | Status: DC
  Administered 2023-01-18 – 2023-01-24 (×7): 220 mg via ORAL
  Filled 2023-01-17 (×7): qty 1

## 2023-01-17 MED ORDER — VITAMIN D 25 MCG (1000 UNIT) PO TABS
1000.0000 [IU] | ORAL_TABLET | Freq: Every day | ORAL | Status: DC
  Administered 2023-01-18 – 2023-01-24 (×7): 1000 [IU] via ORAL
  Filled 2023-01-17 (×7): qty 1

## 2023-01-17 MED ORDER — POLYETHYLENE GLYCOL 3350 17 G PO PACK: 17.0000 g | PACK | Freq: Every day | ORAL | Status: DC | PRN

## 2023-01-17 MED ORDER — PROCHLORPERAZINE MALEATE 5 MG PO TABS: 5.0000 mg | ORAL_TABLET | Freq: Four times a day (QID) | ORAL | Status: DC | PRN

## 2023-01-17 MED ORDER — GABAPENTIN 300 MG PO CAPS
300.0000 mg | ORAL_CAPSULE | Freq: Three times a day (TID) | ORAL | Status: DC
  Administered 2023-01-17: 300 mg via ORAL
  Filled 2023-01-17: qty 1

## 2023-01-17 MED ORDER — OXYCODONE HCL 5 MG PO TABS: 5.0000 mg | ORAL_TABLET | ORAL | Status: DC | PRN

## 2023-01-17 MED ORDER — DEXAMETHASONE 4 MG PO TABS
4.0000 mg | ORAL_TABLET | Freq: Three times a day (TID) | ORAL | Status: DC
  Administered 2023-01-17 – 2023-01-19 (×5): 4 mg via ORAL
  Filled 2023-01-17 (×5): qty 1

## 2023-01-17 MED ORDER — METHOCARBAMOL 500 MG PO TABS: 500.0000 mg | ORAL_TABLET | Freq: Four times a day (QID) | ORAL | Status: DC | PRN

## 2023-01-17 MED ORDER — OXYCODONE HCL ER 15 MG PO T12A
15.0000 mg | EXTENDED_RELEASE_TABLET | Freq: Two times a day (BID) | ORAL | Status: DC
  Administered 2023-01-17 – 2023-01-24 (×14): 15 mg via ORAL
  Filled 2023-01-17 (×14): qty 1

## 2023-01-17 MED ORDER — TRAZODONE HCL 50 MG PO TABS: 50.0000 mg | ORAL_TABLET | Freq: Every evening | ORAL | Status: DC | PRN

## 2023-01-17 MED ORDER — EMPAGLIFLOZIN 10 MG PO TABS
10.0000 mg | ORAL_TABLET | Freq: Every day | ORAL | Status: DC
  Administered 2023-01-18 – 2023-01-24 (×7): 10 mg via ORAL
  Filled 2023-01-17 (×7): qty 1

## 2023-01-17 MED ORDER — POLYVINYL ALCOHOL 1.4 % OP SOLN: 1.0000 [drp] | Freq: Every day | OPHTHALMIC | Status: DC | PRN

## 2023-01-17 MED ORDER — SENNOSIDES-DOCUSATE SODIUM 8.6-50 MG PO TABS: 1.0000 | ORAL_TABLET | Freq: Every evening | ORAL | Status: DC | PRN

## 2023-01-17 MED ORDER — FLEET ENEMA 7-19 GM/118ML RE ENEM: 1.0000 | ENEMA | Freq: Once | RECTAL | Status: DC | PRN

## 2023-01-17 MED ORDER — GUAIFENESIN-DM 100-10 MG/5ML PO SYRP: 10.0000 mL | ORAL_SOLUTION | Freq: Four times a day (QID) | ORAL | Status: DC | PRN

## 2023-01-17 MED ORDER — FLUTICASONE PROPIONATE 50 MCG/ACT NA SUSP
2.0000 | Freq: Every day | NASAL | Status: DC
  Administered 2023-01-18 – 2023-01-23 (×6): 2 via NASAL
  Filled 2023-01-17: qty 16

## 2023-01-17 MED ORDER — ACETAMINOPHEN 325 MG PO TABS: 325.0000 mg | ORAL_TABLET | ORAL | Status: DC | PRN

## 2023-01-17 MED ORDER — ASPIRIN 81 MG PO TBEC
81.0000 mg | DELAYED_RELEASE_TABLET | Freq: Every day | ORAL | Status: DC
  Administered 2023-01-18 – 2023-01-24 (×7): 81 mg via ORAL
  Filled 2023-01-17 (×7): qty 1

## 2023-01-17 MED ORDER — ALUM & MAG HYDROXIDE-SIMETH 200-200-20 MG/5ML PO SUSP: 30.0000 mL | ORAL | Status: DC | PRN

## 2023-01-17 MED ORDER — OXYCODONE HCL 5 MG PO TABS
10.0000 mg | ORAL_TABLET | ORAL | Status: DC | PRN
  Administered 2023-01-18 (×2): 10 mg via ORAL
  Filled 2023-01-17 (×2): qty 2

## 2023-01-17 MED ORDER — LIDOCAINE 5 % EX PTCH
3.0000 | MEDICATED_PATCH | CUTANEOUS | Status: DC
  Administered 2023-01-17 – 2023-01-23 (×7): 3 via TRANSDERMAL
  Filled 2023-01-17 (×7): qty 3

## 2023-01-17 MED ORDER — IRBESARTAN 75 MG PO TABS
150.0000 mg | ORAL_TABLET | Freq: Every day | ORAL | Status: DC
  Administered 2023-01-18 – 2023-01-22 (×5): 150 mg via ORAL
  Filled 2023-01-17 (×5): qty 2

## 2023-01-17 MED ORDER — PROCHLORPERAZINE EDISYLATE 10 MG/2ML IJ SOLN: 5.0000 mg | Freq: Four times a day (QID) | INTRAMUSCULAR | Status: DC | PRN

## 2023-01-17 MED ORDER — VITAMIN B-12 1000 MCG PO TABS
1000.0000 ug | ORAL_TABLET | Freq: Every day | ORAL | Status: DC
  Administered 2023-01-18 – 2023-01-24 (×7): 1000 ug via ORAL
  Filled 2023-01-17 (×7): qty 1

## 2023-01-17 MED ORDER — INSULIN ASPART 100 UNIT/ML IJ SOLN
0.0000 [IU] | Freq: Three times a day (TID) | INTRAMUSCULAR | Status: DC
  Administered 2023-01-17 – 2023-01-19 (×5): 5 [IU] via SUBCUTANEOUS
  Administered 2023-01-19 (×2): 3 [IU] via SUBCUTANEOUS
  Administered 2023-01-20: 5 [IU] via SUBCUTANEOUS
  Administered 2023-01-20 – 2023-01-21 (×3): 3 [IU] via SUBCUTANEOUS
  Administered 2023-01-21: 5 [IU] via SUBCUTANEOUS
  Administered 2023-01-21: 3 [IU] via SUBCUTANEOUS
  Administered 2023-01-22 (×2): 2 [IU] via SUBCUTANEOUS
  Administered 2023-01-22: 3 [IU] via SUBCUTANEOUS
  Administered 2023-01-23: 2 [IU] via SUBCUTANEOUS
  Administered 2023-01-23: 8 [IU] via SUBCUTANEOUS
  Administered 2023-01-23: 5 [IU] via SUBCUTANEOUS

## 2023-01-17 MED ORDER — HYDROCHLOROTHIAZIDE 25 MG PO TABS
25.0000 mg | ORAL_TABLET | Freq: Every day | ORAL | Status: DC
  Administered 2023-01-18 – 2023-01-24 (×7): 25 mg via ORAL
  Filled 2023-01-17 (×7): qty 1

## 2023-01-17 MED ORDER — SORBITOL 70 % SOLN
30.0000 mL | Freq: Every day | Status: DC | PRN
  Administered 2023-01-22: 30 mL via ORAL
  Filled 2023-01-17: qty 30

## 2023-01-17 MED ORDER — PROCHLORPERAZINE 25 MG RE SUPP: 12.5000 mg | Freq: Four times a day (QID) | RECTAL | Status: DC | PRN

## 2023-01-17 MED ORDER — ATORVASTATIN CALCIUM 80 MG PO TABS
80.0000 mg | ORAL_TABLET | Freq: Every day | ORAL | Status: DC
  Administered 2023-01-18 – 2023-01-24 (×7): 80 mg via ORAL
  Filled 2023-01-17 (×7): qty 1

## 2023-01-17 NOTE — TOC Transition Note (Signed)
Transition of Care [facility]) - CM/SW Discharge Note   Patient Details  Name: Robin [name] MRN: [id number] Date of Birth: 01-31-1956  [facility]) CM/SW Contact:  Kermit Balo, RN Phone Number: 01/17/2023, 3:44 PM   Clinical Narrative:     Pt is discharging to CIR today. CM signing off.   Final next level of care: IP Rehab Facility Barriers to Discharge: No Barriers Identified   Patient Goals and CMS Choice CMS Medicare.gov Compare Post Acute Care list provided to:: Patient Choice offered to / list presented to : Patient  Discharge Placement                         Discharge Plan and Services Additional resources added to the After Visit Summary for                                       Social Determinants of Health (SDOH) Interventions SDOH Screenings   Food Insecurity: No Food Insecurity (01/11/2023)  Housing: Low Risk  (01/11/2023)  Transportation Needs: No Transportation Needs (01/11/2023)  Utilities: Not At Risk (01/11/2023)  Alcohol Screen: Low Risk  (11/26/2022)  Depression (PHQ2-9): Low Risk  (11/26/2022)  Financial Resource Strain: Low Risk  (11/26/2022)  Physical Activity: Insufficiently Active (11/26/2022)  Social Connections: Moderately Integrated (11/26/2022)  Stress: No Stress Concern Present (11/26/2022)  Tobacco Use: Medium Risk (01/13/2023)     Readmission Risk Interventions     No data to display

## 2023-01-17 NOTE — Addendum Note (Signed)
Addendum  created 01/17/23 0719 by Eilene Ghazi, MD   Attestation recorded in Intraprocedure, Intraprocedure Attestations filed

## 2023-01-17 NOTE — Progress Notes (Signed)
Occupational Therapy Treatment Patient Details Name: Robin [name] MRN: [id number] DOB: 1955/11/26 Today's Date: 01/17/2023   History of present illness Patient is 67 y.o. female s/p C3-4 ACDF on 01/11/23 after presenting to [id number] following a fall striking her head on the Lt and Rt UE weakness. MRI revealed cord compression at C3/4. PMH significant for asthma, DM, GERD, HLD, HTN, Rt RCR.   OT comments  Patient demonstrating good gains this treatment session with min guard assist to stand and ambulate to sink with SPC for grooming tasks. Patient able to perform grooming tasks with single extremity support and min guard assist. Session completed with the arrival of transportation to take patient to MRI. Patient will benefit from intensive inpatient follow up therapy, >3 hours/day.   Recommendations for follow up therapy are one component of a multi-disciplinary discharge planning process, led by the attending physician.  Recommendations may be updated based on patient status, additional functional criteria and insurance authorization.    Assistance Recommended at Discharge Frequent or constant Supervision/Assistance  Patient can return home with the following  Assistance with cooking/housework;Assistance with feeding;Help with stairs or ramp for entrance;Assist for transportation;A little help with walking and/or transfers;A little help with bathing/dressing/bathroom   Equipment Recommendations  Other (comment) (defer)    Recommendations for Other Services      Precautions / Restrictions Precautions Precautions: Fall;Cervical Precaution Booklet Issued: No Precaution Comments: no brace per MD Restrictions Weight Bearing Restrictions: No       Mobility Bed Mobility Overal bed mobility: Needs Assistance             General bed mobility comments: pt sitting up EOB    Transfers Overall transfer level: Needs assistance Equipment used: Straight cane Transfers: Sit to/from  Stand Sit to Stand: Min guard     Step pivot transfers: Min guard     General transfer comment: performing sit to stands and transfers with min guard assist     Balance Overall balance assessment: Needs assistance Sitting-balance support: Feet supported, Bilateral upper extremity supported Sitting balance-Leahy Scale: Fair Sitting balance - Comments: completeing breakfast seated on EOB upon entry   Standing balance support: Single extremity supported, Bilateral upper extremity supported, During functional activity Standing balance-Leahy Scale: Fair Standing balance comment: able to perform grooming tasks standing at sink with one extremity support                           ADL either performed or assessed with clinical judgement   ADL Overall ADL's : Needs assistance/impaired Eating/Feeding: Set up;With adaptive utensils Eating/Feeding Details (indicate cue type and reason): sitting on EOB completing breakfast upon entry Grooming: Wash/dry hands;Wash/dry face;Oral care;Min guard;Standing           Upper Body Dressing : Minimal assistance;Sitting Upper Body Dressing Details (indicate cue type and reason): changed gown                   General ADL Comments: incomplete visit due to the arrival of transport for MRI    Extremity/Trunk Assessment              Vision       Perception     Praxis      Cognition Arousal/Alertness: Awake/alert Behavior During Therapy: WFL for tasks assessed/performed Overall Cognitive Status: Within Functional Limits for tasks assessed  Exercises      Shoulder Instructions       General Comments      Pertinent Vitals/ Pain       Pain Assessment Pain Assessment: Faces Faces Pain Scale: Hurts little more Pain Location: BUE shoulders, neck, upper back Pain Descriptors / Indicators: Aching, Discomfort Pain Intervention(s): Limited activity within  patient's tolerance, Monitored during session  Home Living                                          Prior Functioning/Environment              Frequency  Min 1X/week        Progress Toward Goals  OT Goals(current goals can now be found in the care plan section)  Progress towards OT goals: Progressing toward goals  Acute Rehab OT Goals Patient Stated Goal: get stronger OT Goal Formulation: With patient Time For Goal Achievement: 01/26/23 Potential to Achieve Goals: Good ADL Goals Pt Will Perform Grooming: with modified independence;standing Pt Will Perform Upper Body Bathing: Independently;sitting Pt Will Perform Lower Body Bathing: with modified independence;sit to/from stand Pt Will Transfer to Toilet: with modified independence;ambulating;bedside commode Pt Will Perform Toileting - Clothing Manipulation and hygiene: with modified independence;sit to/from stand Pt/caregiver will Perform Home Exercise Program: Increased ROM;Increased strength;Both right and left upper extremity;With written HEP provided;Independently  Plan Discharge plan remains appropriate    Co-evaluation                 AM-PAC OT "6 Clicks" Daily Activity     Outcome Measure   Help from another person eating meals?: A Little Help from another person taking care of personal grooming?: A Little Help from another person toileting, which includes using toliet, bedpan, or urinal?: A Little Help from another person bathing (including washing, rinsing, drying)?: A Lot Help from another person to put on and taking off regular upper body clothing?: A Lot Help from another person to put on and taking off regular lower body clothing?: A Lot 6 Click Score: 15    End of Session Equipment Utilized During Treatment: Gait belt;Other (comment) (Single point cane)  OT Visit Diagnosis: Unsteadiness on feet (R26.81);Other abnormalities of gait and mobility (R26.89);Muscle weakness  (generalized) (M62.81);Pain Pain - Right/Left: Right Pain - part of body: Shoulder (and left)   Activity Tolerance Patient tolerated treatment well   Patient Left in chair;Other (comment) (in transport chair with transportation staff)   Nurse Communication Mobility status        Time: [phone] OT Time Calculation (min): 17 min  Charges: OT General Charges $OT Visit: 1 Visit OT Treatments $Self Care/Home Management : 8-22 mins  Alfonse Flavors, OTA Acute Rehabilitation Services  Office [phone]   Dewain Penning 01/17/2023, 10:01 AM

## 2023-01-17 NOTE — Discharge Instructions (Signed)
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
## 2023-01-17 NOTE — Discharge Summary (Signed)
Physician Discharge Summary  Patient ID: Robin [name] MRN: [id number] DOB/AGE: 11/11/1955 67 y.o.  Admit date: 01/11/2023 Discharge date: 01/17/2023  Admission Diagnoses:Cervical spinal cord compression cervical stenosis with myelopathy C3/4  Discharge Diagnoses:  Principal Problem:   S/P cervical discectomy Active Problems:   Cervical cord compression with myelopathy [facility])   Discharged Condition: good  Hospital Course: Robin [name] was admitted with the acute onset of weakness in the upper extremities. MRI showed osteophyte and severe stenosis at C3/4. She was taken to the operating room emergently and underwent an ACDF at C3/4,using nuvasive ACP plating. Post op she has had improved strength and coordination in the right upper extremity. The wound is healing well, voice is strong.  Mri shoulder revealed no cuff tear but arthritis and tendon inflammation.she has weakness in the upper extremities, 4/5 with improvement from preop. Discharged to rehab Treatments: surgery:  Anterior Cervical decompression C3/4 Arthrodesis C3-4 with 7mm structural allograft Anterior instrumentation(Nuvasive acp) C3-4  Discharge Exam: Blood pressure 101/69, pulse 71, temperature 98.7 F (37.1 C), temperature source Oral, resp. rate 16, height 5\' 4"  (1.626 m), weight 54 kg, last menstrual period 08/11/2011, SpO2 99 %. General appearance: alert, cooperative, appears stated age, and mild distress  Disposition: Discharge disposition: [facility]-Another Health Care Institution Not Defined      NECK PAIN  Allergies as of 01/17/2023       Reactions   Amoxicillin-pot Clavulanate Itching   Causes severe yeast infection   Tape Itching   Now using a rubbery type tape.        Medication List     TAKE these medications    aspirin EC 81 MG tablet Take 1 tablet (81 mg total) by mouth daily. Swallow whole.   atorvastatin 80 MG tablet Commonly known as: LIPITOR Take 1 tablet (80 mg total) by  mouth daily.   cholecalciferol 25 MCG (1000 UNIT) tablet Commonly known as: VITAMIN D3 Take 1,000 Units by mouth daily.   cyanocobalamin 1000 MCG tablet Commonly known as: VITAMIN B12 Take 1,000 mcg by mouth daily.   empagliflozin 10 MG Tabs tablet Commonly known as: JARDIANCE Take 1 tablet (10 mg total) by mouth daily.   fluticasone 50 MCG/ACT nasal spray Commonly known as: FLONASE USE 2 SPRAYS IN EACH NOSTRIL EVERY DAY What changed:  when to take this reasons to take this   hydrochlorothiazide 25 MG tablet Commonly known as: HYDRODIURIL TAKE 1 TABLET EVERY DAY (NEED MD APPOINTMENT FOR REFILLS) What changed: See the new instructions.   metFORMIN 1000 MG tablet Commonly known as: GLUCOPHAGE TAKE 1 TABLET TWICE DAILY WITH MEALS (NEED MD APPOINTMENT) What changed: See the new instructions.   olmesartan 20 MG tablet Commonly known as: BENICAR Take 1 tablet (20 mg total) by mouth at bedtime.   oxyCODONE-acetaminophen 5-325 MG tablet Commonly known as: PERCOCET/ROXICET Take 1 tablet by mouth 2 (two) times daily as needed for severe pain. Please call to schedule visit in 3 months. What changed:  when to take this additional instructions   zinc sulfate 220 (50 Zn) MG capsule Take 220 mg by mouth daily.        Follow-up Information     Coletta Memos, MD Follow up.   Specialty: Neurosurgery Why: call to make an appointment Contact information: [address] [place] [address] [phone]                 Signed: Coletta Memos 01/17/2023, 3:41 PM

## 2023-01-17 NOTE — Progress Notes (Signed)
Inpatient Rehab Admissions Coordinator:    I have insurance auth and a CIR bed for this Pt. If MD will place d/c order, I can move her today.   Megan Salon, MS, CCC-SLP Rehab Admissions Coordinator  [phone] (celll) [phone] (office)
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
## 2023-01-17 NOTE — Inpatient Diabetes Management (Signed)
Inpatient Diabetes Program Recommendations  AACE/ADA: New Consensus Statement on Inpatient Glycemic Control (2015)  Target Ranges:  Prepandial:   less than 140 mg/dL      Peak postprandial:   less than 180 mg/dL (1-2 hours)      Critically ill patients:  140 - 180 mg/dL   Lab Results  Component Value Date   GLUCAP 217 (H) 01/17/2023   HGBA1C 7.2 (H) 01/15/2023    Review of Glycemic Control  Latest Reference Range & Units 01/16/23 07:41 01/16/23 12:37 01/16/23 16:40 01/16/23 21:12 01/17/23 06:57  Glucose-Capillary 70 - 99 mg/dL 161 (H) 096 (H) 045 (H) 287 (H) 217 (H)   Diabetes history: DM 2 Outpatient Diabetes medications: Jardiance 10 mg Daily, metformin 500 mg bid Current orders for Inpatient glycemic control:  Novolog 0-15 units tid Metformin 500 mg bid Jardiance 10 mg Daily  Decadron 4 mg tid  Inpatient Diabetes Program Recommendations:    Glucose trends increase due to steroids and after meal intake:  -  consider starting Novolog 4 units tid meal coverage if eating >50% of meals  Thanks,  Christena Deem RN, MSN, BC-ADM Inpatient Diabetes Coordinator Team Pager [phone] (8a-5p)
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
## 2023-01-17 NOTE — Progress Notes (Signed)
Inpatient Rehab Admissions Coordinator:    Pt. Will admit to CIR today.  Megan Salon, MS, CCC-SLP Rehab Admissions Coordinator  [phone] (celll) [phone] (office)

## 2023-01-18 DIAGNOSIS — G959 Disease of spinal cord, unspecified: Secondary | ICD-10-CM | POA: Diagnosis not present

## 2023-01-18 LAB — COMPREHENSIVE METABOLIC PANEL
ALT: 26 U/L (ref 0–44)
AST: 16 U/L (ref 15–41)
Albumin: 3.3 g/dL — ABNORMAL LOW (ref 3.5–5.0)
Alkaline Phosphatase: 59 U/L (ref 38–126)
Anion gap: 8 (ref 5–15)
BUN: 34 mg/dL — ABNORMAL HIGH (ref 8–23)
CO2: 31 mmol/L (ref 22–32)
Calcium: 9.4 mg/dL (ref 8.9–10.3)
Chloride: 94 mmol/L — ABNORMAL LOW (ref 98–111)
Creatinine, Ser: 1 mg/dL (ref 0.44–1.00)
GFR, Estimated: >60 mL/min (ref >60)
Glucose, Bld: 293 mg/dL — ABNORMAL HIGH (ref 70–99)
Potassium: 3.9 mmol/L (ref 3.5–5.1)
Sodium: 133 mmol/L — ABNORMAL LOW (ref 135–145)
Total Bilirubin: 0.6 mg/dL (ref 0.3–1.2)
Total Protein: 6.8 g/dL (ref 6.5–8.1)

## 2023-01-18 LAB — CBC WITH DIFFERENTIAL/PLATELET
Abs Immature Granulocytes: 0.09 10*3/uL — ABNORMAL HIGH (ref 0.00–0.07)
Basophils Absolute: 0 10*3/uL (ref 0.0–0.1)
Basophils Relative: 0 %
Eosinophils Absolute: 0 10*3/uL (ref 0.0–0.5)
Eosinophils Relative: 0 %
HCT: 38.2 % (ref 36.0–46.0)
Hemoglobin: 11.8 g/dL — ABNORMAL LOW (ref 12.0–15.0)
Immature Granulocytes: 1 %
Lymphocytes Relative: 21 %
Lymphs Abs: 3.3 10*3/uL (ref 0.7–4.0)
MCH: 28.1 pg (ref 26.0–34.0)
MCHC: 30.9 g/dL (ref 30.0–36.0)
MCV: 91 fL (ref 80.0–100.0)
Monocytes Absolute: 1.2 10*3/uL — ABNORMAL HIGH (ref 0.1–1.0)
Monocytes Relative: 8 %
Neutro Abs: 10.7 10*3/uL — ABNORMAL HIGH (ref 1.7–7.7)
Neutrophils Relative %: 70 %
Platelets: 396 10*3/uL (ref 150–400)
RBC: 4.2 MIL/uL (ref 3.87–5.11)
RDW: 14.5 % (ref 11.5–15.5)
WBC: 15.3 10*3/uL — ABNORMAL HIGH (ref 4.0–10.5)
nRBC: 0 % (ref 0.0–0.2)

## 2023-01-18 LAB — GLUCOSE, CAPILLARY
Glucose-Capillary: 202 mg/dL — ABNORMAL HIGH (ref 70–99)
Glucose-Capillary: 214 mg/dL — ABNORMAL HIGH (ref 70–99)
Glucose-Capillary: 226 mg/dL — ABNORMAL HIGH (ref 70–99)
Glucose-Capillary: 435 mg/dL — ABNORMAL HIGH (ref 70–99)
Glucose-Capillary: 308 mg/dL — ABNORMAL HIGH (ref 70–99)

## 2023-01-18 LAB — GLUCOSE, RANDOM: Glucose, Bld: 384 mg/dL — ABNORMAL HIGH (ref 70–99)

## 2023-01-18 MED ORDER — INSULIN ASPART 100 UNIT/ML IJ SOLN
4.0000 [IU] | INTRAMUSCULAR | Status: AC
  Administered 2023-01-18: 4 [IU] via SUBCUTANEOUS

## 2023-01-18 MED ORDER — BACLOFEN 5 MG HALF TABLET
5.0000 mg | ORAL_TABLET | Freq: Three times a day (TID) | ORAL | Status: DC
  Administered 2023-01-18 – 2023-01-20 (×7): 5 mg via ORAL
  Filled 2023-01-18 (×7): qty 1

## 2023-01-18 MED ORDER — GABAPENTIN 300 MG PO CAPS
600.0000 mg | ORAL_CAPSULE | Freq: Two times a day (BID) | ORAL | Status: DC
  Administered 2023-01-18 (×2): 600 mg via ORAL
  Filled 2023-01-18 (×3): qty 2

## 2023-01-18 MED ORDER — INSULIN GLARGINE-YFGN 100 UNIT/ML ~~LOC~~ SOLN
10.0000 [IU] | Freq: Every day | SUBCUTANEOUS | Status: DC
  Administered 2023-01-18 – 2023-01-23 (×6): 10 [IU] via SUBCUTANEOUS
  Filled 2023-01-18 (×7): qty 0.1

## 2023-01-18 NOTE — Care Management (Signed)
Inpatient Rehabilitation Center Individual Statement of Services  Patient Name:  Robin [name]  Date:  01/18/2023  Welcome to the Inpatient Rehabilitation Center.  Our goal is to provide you with an individualized program based on your diagnosis and situation, designed to meet your specific needs.  With this comprehensive rehabilitation program, you will be expected to participate in at least 3 hours of rehabilitation therapies Monday-Friday, with modified therapy programming on the weekends.  Your rehabilitation program will include the following services:  Physical Therapy (PT), Occupational Therapy (OT), 24 hour per day rehabilitation nursing, Therapeutic Recreaction (TR), Psychology, Neuropsychology, Care Coordinator, Rehabilitation Medicine, Nutrition Services, Pharmacy Services, and Other  Weekly team conferences will be held on Tuesdays to discuss your progress.  Your Inpatient Rehabilitation Care Coordinator will talk with you frequently to get your input and to update you on team discussions.  Team conferences with you and your family in attendance may also be held.  Expected length of stay: 3-5 days    Overall anticipated outcome: Independent with Assistive Device  Depending on your progress and recovery, your program may change. Your Inpatient Rehabilitation Care Coordinator will coordinate services and will keep you informed of any changes. Your Inpatient Rehabilitation Care Coordinator's name and contact numbers are listed  below.  The following services may also be recommended but are not provided by the Inpatient Rehabilitation Center:  Driving Evaluations Home Health Rehabiltiation Services Outpatient Rehabilitation Services Vocational Rehabilitation   Arrangements will be made to provide these services after discharge if needed.  Arrangements include referral to agencies that provide these services.  Your insurance has been verified to be:  [employer]  Your  primary doctor is:  Venora Maples  Pertinent information will be shared with your doctor and your insurance company.  Inpatient Rehabilitation Care Coordinator:  Susie Cassette [phone] or ([phone]  Information discussed with and copy given to patient by: [name], 01/18/2023, 4:49 PM
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
## 2023-01-18 NOTE — Progress Notes (Signed)
Physical Therapy Session Note  Patient Details  Name: Robin [name] MRN: [id number] Date of Birth: [birth year]  Today's Date: 01/18/2023 PT Individual Time: 4098-1191 PT Individual Time Calculation (min): 70 min    Short Term Goals: Week 1:  PT Short Term Goal 1 (Week 1): STG's = LTG's due to ELOS  Skilled Therapeutic Interventions/Progress Updates: Pt presented in bed agreeable to therapy. Pt c/o unrated in pain shoulders radiating down to elbows. RN arrived to provide meds and insulin. Pt performed bed mobility with supervision and ambulated to bathroom without AD and supervision (continent urinary void). Pt then ambulated to sink to complete oral hygiene in standing with supervision. Pt ambulated to main gym with CGA without AD. Pt ambulated with decreased gait speed with mild lateral sway but not LOB. Pt participated in standing balance activities incorporating RUE while standing on Airex. Activities including initially standing on Airex with mild nudges which pt initially required minA but improved to CGA. Pt then participated in reaching task picking up squigz with RUE from tray then crossing midline to place on tray on left side. Pt was able to replicate this picking up on left side and placing on R x 3 times. During seated rest pt noted to appear drowsy and having difficulty keeping eyes open. Pt stating MD increased some meds (possibly gabapentin) and feels like it having an affect on her. Pt then worked on Sit to stand while standing on Airex. Pt able to perform with CGA initially but significantly increased sway when returning to sitting. Pt expressing continues to feel "off". Pt requesting to return to room however agreeable to perform seated activities when asked by therapist. Pt was provided with [facility] and ambulated with CGA to day room. Pt transferred to NuStep and was able to participate in NuStep L3 x 10 min maintaining 40-50SPM for general conditioning. Pt was able to use x 4  extremities and stating after several minutes pt stating shoulders felt a bit better. Pt then ambulated back to room and returned to bed with supervision. Pt repositioned to comfort and left in bed with call bell within reach and x 4 rails up per pt request.    Therapy Documentation Precautions:  Precautions Precautions: Fall, Cervical Precaution Comments: no brace per MD Restrictions Weight Bearing Restrictions: No General:   Vital Signs: Therapy Vitals Temp: 98.3 F (36.8 C) Temp Source: Oral Pulse Rate: (!) 59 Resp: 16 BP: 127/60 Patient Position (if appropriate): Sitting Oxygen Therapy SpO2: 100 % O2 Device: Room Air        Therapy/Group: Individual Therapy  Bynum Mccullars 01/18/2023, 4:11 PM

## 2023-01-18 NOTE — Progress Notes (Addendum)
Patient ID: Robin [name], female   DOB: 12-18-1955, 67 y.o.   MRN: [id number]  1043- SW made contact with pt dtr [name] to introduce self, explain role, and discuss discharge process. Confirms she will be the primary caregiver as she is caring for her father who has stage 3 lung cancer--he receives aide through [place]. She is aware SW will continue to provide updates as available.   *SW received updates from medical team reporting short ELOS and d/c on Monday.   1238- SW informed pt dtr [name] on above with regard to discharge. SW will confirm d/c recs.   SW informed pt as well.   *SW spoke with pt dtr [name] to discuss if any issues with renovating bathroom. Reports there are currently no renovations taking place at this time. Pt will have access to a walk in shower that is in another part of the home. Family edu scheduled for Friday 8am-10am. Preferred outpatient location is [facility].   Cecile Sheerer, MSW, LCSWA Office: [phone] Cell: [phone] Fax: [phone]
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
## 2023-01-18 NOTE — Progress Notes (Addendum)
PROGRESS NOTE   Subjective/Complaints:  Pt reports so stiff this AM Even with Robaxin- feels so tight.   Main complaint is nerve pain.  Super sensitive skin.    ROS:  Pt denies SOB, abd pain, CP, N/V/C/D, and vision changes   Except for HPI Objective:   MR SHOULDER LEFT WO CONTRAST  Result Date: 01/17/2023 CLINICAL DATA:  Left shoulder pain after injury on 01/11/2023 EXAM: MRI OF THE LEFT SHOULDER WITHOUT CONTRAST TECHNIQUE: Multiplanar, multisequence MR imaging of the shoulder was performed. No intravenous contrast was administered. COMPARISON:  MRI 08/09/2011 FINDINGS: Rotator cuff: Moderate rotator cuff tendinosis, most pronounced within the subscapularis and infraspinatus tendons. No rotator cuff tear. Muscles: Mild infraspinatus muscle edema. No rotator cuff muscle atrophy or fatty infiltration. Biceps long head:  Mild intra-articular biceps tendinosis. Acromioclavicular Joint: Moderate degenerative changes of the AC joint. Small volume subacromial-subdeltoid bursal fluid. Glenohumeral Joint: No joint effusion. Mild chondral thinning without focal defect. Labrum: Grossly intact although evaluation is limited in the absence of intra-articular fluid. No paralabral cyst. Bones: No acute fracture. No dislocation. Os acromiale. No bone marrow edema. No marrow replacing bone lesion. Other: None. IMPRESSION: 1. Moderate rotator cuff tendinosis. No rotator cuff tear. 2. Mild infraspinatus muscle edema, which may be related to muscle strain. 3. Mild intra-articular biceps tendinosis. 4. Moderate AC joint osteoarthritis. 5. Mild subacromial-subdeltoid bursitis. Electronically Signed   By: Duanne Guess D.O.   On: 01/17/2023 13:10   Recent Labs    01/18/23 0736  WBC 15.3*  HGB 11.8*  HCT 38.2  PLT 396   No results for input(s): "NA", "K", "CL", "CO2", "GLUCOSE", "BUN", "CREATININE", "CALCIUM" in the last 72 hours.  Intake/Output  Summary (Last 24 hours) at 01/18/2023 0821 Last data filed at 01/18/2023 0755 Gross per 24 hour  Intake 717 ml  Output --  Net 717 ml        Physical Exam: Vital Signs Blood pressure (!) 136/54, pulse (!) 56, temperature 97.8 F (36.6 C), temperature source Oral, resp. rate 16, weight 57.3 kg, last menstrual period 08/11/2011, SpO2 100 %.   General: awake, alert, appropriate, sitting EOB; NAD HENT: conjugate gaze; oropharynx moist CV: regular to borderline bradycardic rate and rhythm; no JVD Pulmonary: CTA B/L; no W/R/R- good air movement GI: soft, NT, ND, (+)BS-normoactive Psychiatric: appropriate- pleasant Neurological: Ox3 Musculoskeletal:     Comments: RUE- biceps 3+/5; Triceps 4-/5; WE 4-/5; Grip 3+/5 and FA 2-/5- is much better than [facility] notes 2 days ago LUE- 4+/5  except FA 4-/5 LE"s 5-/5 in HF/KE/DF/PF and EHL B/L  Impingement signs with empty can test (-) on exam- pain with touching LUE and RUE, however it's more skin/muscle TTP- c/o tingling and burning/nerve pain  Skin:    General: Skin is warm and dry.     Comments: R hand swelling almost gone  Neurological:     Mental Status: She is alert and oriented to person, place, and time.     Comments: Decreased to light touch slightly in RUE- on exam, LUE and LE's intact to light touch Hofmfan's brisk in RUE; not in LUE No clonus or increased tone found on exam   Assessment/Plan: 1.  Functional deficits which require 3+ hours per day of interdisciplinary therapy in a comprehensive inpatient rehab setting. Physiatrist is providing close team supervision and 24 hour management of active medical problems listed below. Physiatrist and rehab team continue to assess barriers to discharge/monitor patient progress toward functional and medical goals  Care Tool:  Bathing              Bathing assist       Upper Body Dressing/Undressing Upper body dressing        Upper body assist      Lower Body  Dressing/Undressing Lower body dressing            Lower body assist       Toileting Toileting    Toileting assist       Transfers Chair/bed transfer  Transfers assist           Locomotion Ambulation   Ambulation assist              Walk 10 feet activity   Assist           Walk 50 feet activity   Assist           Walk 150 feet activity   Assist           Walk 10 feet on uneven surface  activity   Assist           Wheelchair     Assist               Wheelchair 50 feet with 2 turns activity    Assist            Wheelchair 150 feet activity     Assist          Blood pressure (!) 136/54, pulse (!) 56, temperature 97.8 F (36.6 C), temperature source Oral, resp. rate 16, weight 57.3 kg, last menstrual period 08/11/2011, SpO2 100 %.  Medical Problem List and Plan: 1. 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  Antithrombotics: -DVT/anticoagulation:  Mechanical:  Antiembolism stockings, knee (TED hose) Bilateral lower extremities             -antiplatelet therapy: Aspirin    3. Pain Management: Tylenol as needed             -continue gabapentin 300 mg TID             -continue Oxycontin 15 mg BID             -continue oxycodone prn             Will add lidoderm patches at night             7/10- will increase Gabapentin to 600 mg BID- wants to wait on Lyrica/Cymbalta.  4. Mood/Behavior/Sleep: LCSW to evaluate and provide emotional support             -antipsychotic agents: n/a   5. Neuropsych/cognition: This patient is capable of making decisions on her own behalf.   6. Skin/Wound Care: Routine skin care checks             -monitor surgical  incision   7. 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 moderate AC joint osteoarthritis and mild subacromial-subdeltoid bursitis   13. Nerve pain- most likely the cause of pain- also has Trigger points- con't Robaxin prn- but likely can do trigger point injections if pt wants them Thursday   7/10- will increase Gabapentin to 600 mg BID- if tolerated, will increase to TID after a few days.  14. Spasticity?  7/10- has Hoffman's RUE- will change Robaxin to Baclofen 5 mg TID   I spent a total of  52   minutes on total care today- >50% coordination of care- due to  D/w pt about spasticity, nerve pain; gave options for nerve pain- discussed at length- also d/w nursing about medical issues-  Will d/w pt tomorrow about Insulin/since Decadron-called [facility] AND SPOKE WITH PT AGAIN.   LOS: 1 days A FACE TO FACE EVALUATION WAS PERFORMED  Robin [name] 01/18/2023, 8:21 AM

## 2023-01-18 NOTE — Progress Notes (Signed)
PCP to bedside. Patient states she is doing well with rehab. Feels her arm is improving both with pain and with strength. Feels she is being well taken care of. Appreciate care of Rehab Team and Neurosurgery.

## 2023-01-18 NOTE — Plan of Care (Signed)
  Problem: RH Eating Goal: LTG Patient will perform eating w/assist, cues/equip (OT) Description: LTG: Patient will perform eating with assist, with/without cues using equipment (OT) Flowsheets (Taken 01/18/2023 1143) LTG: Pt will perform eating with assistance level of: Independent with assistive device    Problem: RH Grooming Goal: LTG Patient will perform grooming w/assist,cues/equip (OT) Description: LTG: Patient will perform grooming with assist, with/without cues using equipment (OT) Flowsheets (Taken 01/18/2023 1143) LTG: Pt will perform grooming with assistance level of: Independent with assistive device    Problem: RH Bathing Goal: LTG Patient will bathe all body parts with assist levels (OT) Description: LTG: Patient will bathe all body parts with assist levels (OT) Flowsheets (Taken 01/18/2023 1143) LTG: Pt will perform bathing with assistance level/cueing: Independent with assistive device    Problem: RH Dressing Goal: LTG Patient will perform upper body dressing (OT) Description: LTG Patient will perform upper body dressing with assist, with/without cues (OT). Flowsheets (Taken 01/18/2023 1143) LTG: Pt will perform upper body dressing with assistance level of: Independent with assistive device Goal: LTG Patient will perform lower body dressing w/assist (OT) Description: LTG: Patient will perform lower body dressing with assist, with/without cues in positioning using equipment (OT) Flowsheets (Taken 01/18/2023 1143) LTG: Pt will perform lower body dressing with assistance level of: Independent with assistive device   Problem: RH Toileting Goal: LTG Patient will perform toileting task (3/3 steps) with assistance level (OT) Description: LTG: Patient will perform toileting task (3/3 steps) with assistance level (OT)  Flowsheets (Taken 01/18/2023 1143) LTG: Pt will perform toileting task (3/3 steps) with assistance level: Independent with assistive device   Problem: RH Functional  Use of Upper Extremity Goal: LTG Patient will use RT/LT upper extremity as a (OT) Description: LTG: Patient will use right/left upper extremity as a stabilizer/gross assist/diminished/nondominant/dominant level with assist, with/without cues during functional activity (OT) Flowsheets (Taken 01/18/2023 1143) LTG: Use of upper extremity in functional activities: RUE as dominant level LTG: Pt will use upper extremity in functional activity with assistance level of: Independent with assistive device   Problem: RH Simple Meal Prep Goal: LTG Patient will perform simple meal prep w/assist (OT) Description: LTG: Patient will perform simple meal prep with assistance, with/without cues (OT). Flowsheets (Taken 01/18/2023 1143) LTG: Pt will perform simple meal prep with assistance level of: Independent with assistive device LTG: Pt will perform simple meal prep w/level of: Ambulate with device   Problem: RH Toilet Transfers Goal: LTG Patient will perform toilet transfers w/assist (OT) Description: LTG: Patient will perform toilet transfers with assist, with/without cues using equipment (OT) Flowsheets (Taken 01/18/2023 1143) LTG: Pt will perform toilet transfers with assistance level of: Independent with assistive device   Problem: RH Tub/Shower Transfers Goal: LTG Patient will perform tub/shower transfers w/assist (OT) Description: LTG: Patient will perform tub/shower transfers with assist, with/without cues using equipment (OT) Flowsheets (Taken 01/18/2023 1143) LTG: Pt will perform tub/shower stall transfers with assistance level of: Independent with assistive device

## 2023-01-18 NOTE — Progress Notes (Signed)
Inpatient Rehabilitation  Patient information reviewed and entered into eRehab system by Arvin Abello M. Javonte Elenes, M.A., CCC/SLP, PPS Coordinator.  Information including medical coding, functional ability and quality indicators will be reviewed and updated through discharge.    
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
## 2023-01-18 NOTE — Discharge Summary (Signed)
Physician Discharge Summary  Patient ID: Robin [name] MRN: [id number] DOB/AGE: Oct 21, 1955 67 y.o.  Admit date: 01/17/2023 Discharge date: 01/24/2023  Discharge Diagnoses:  Principal Problem:   Cervical myelopathy (HCC) Active problems: Functional deficits secondary to cervical myelopathy Hypertension Hyperlipidemia DM-2 Left rotator cuff tendinosis  Discharged Condition: good  Significant Diagnostic Studies: MR SHOULDER LEFT WO CONTRAST  Result Date: 01/17/2023 CLINICAL DATA:  Left shoulder pain after injury on 01/11/2023 EXAM: MRI OF THE LEFT SHOULDER WITHOUT CONTRAST TECHNIQUE: Multiplanar, multisequence MR imaging of the shoulder was performed. No intravenous contrast was administered. COMPARISON:  MRI 08/09/2011 FINDINGS: Rotator cuff: Moderate rotator cuff tendinosis, most pronounced within the subscapularis and infraspinatus tendons. No rotator cuff tear. Muscles: Mild infraspinatus muscle edema. No rotator cuff muscle atrophy or fatty infiltration. Biceps long head:  Mild intra-articular biceps tendinosis. Acromioclavicular Joint: Moderate degenerative changes of the AC joint. Small volume subacromial-subdeltoid bursal fluid. Glenohumeral Joint: No joint effusion. Mild chondral thinning without focal defect. Labrum: Grossly intact although evaluation is limited in the absence of intra-articular fluid. No paralabral cyst. Bones: No acute fracture. No dislocation. Os acromiale. No bone marrow edema. No marrow replacing bone lesion. Other: None. IMPRESSION: 1. Moderate rotator cuff tendinosis. No rotator cuff tear. 2. Mild infraspinatus muscle edema, which may be related to muscle strain. 3. Mild intra-articular biceps tendinosis. 4. Moderate AC joint osteoarthritis. 5. Mild subacromial-subdeltoid bursitis. Electronically Signed   By: Duanne Guess D.O.   On: 01/17/2023 13:10    Labs:  Basic Metabolic Panel: Recent Labs  Lab 01/18/23 0736 01/18/23 2214 01/22/23 1127  01/23/23 0642  NA 133*  --  134* 133*  K 3.9  --  3.8 4.4  CL 94*  --  96* 96*  CO2 31  --  29 30  GLUCOSE 293* 384* 205* 186*  BUN 34*  --  32* 35*  CREATININE 1.00  --  1.01* 1.19*  CALCIUM 9.4  --  9.1 9.0  MG  --   --  1.8  --     CBC: Recent Labs  Lab 01/18/23 0736 01/22/23 1556 01/23/23 0642  WBC 15.3* 13.4* 13.7*  NEUTROABS 10.7* 9.4*  --   HGB 11.8* 12.6 11.2*  HCT 38.2 39.6 35.1*  MCV 91.0 90.4 92.4  PLT 396 460* 419*    CBG: Recent Labs  Lab 01/23/23 0613 01/23/23 1136 01/23/23 1629 01/23/23 2051 01/24/23 0620  GLUCAP 214* 144* 254* 201* 70    Brief HPI:   [name] is a 67 y.o. female who feel at home on without apparent injury except she reported that she had difficulty moving R>LUE's and shoulder pain. She presented to the ED on 01/11/2023. She denied LOC. MRI revealed severe cord compression at C3-4 level with signal. Neurosurgery consulted and admitted the patient. She underwent anterior cervical decompression and arthrodesis for cervical spinal cord compression at levels C3/4 by Dr. Franky Macho on 7/03. PT/OT evaluations obtained. On exam POD #4 by Dr. Jake Samples, Right grip 3/5, finger extension 2/5, Left upper/bilateral lower extremities full strength. She continues on dexamethasone. MRI of left shoulder revealed no cuff tear but arthritis and tendon inflammation. She has weakness in the upper extremities.   Hospital Course: Ansley Payne-Robinson was admitted to rehab 01/17/2023 for inpatient therapies to consist of PT, ST and OT at least three hours five days a week. Past admission physiatrist, therapy team and rehab RN have worked together to provide customized collaborative inpatient rehab. Labs stable on 7/10. Added Lidoderm patches at night and increased  gabapentin to 600 mg BID. Hoffman's RUE>>robaxin changed to baclofen 5 mg TID. Diabetic coordinator consulted. Complained of nerve pain and supersensitive skin. Started dexamethasone taper and  continued insulin coverage through discharge. Baclofen discontinued on 7/11 due to muscle "jerking", spasms. Added back but did not tolerate so d/c's and placed this on allergy list. Over-sedation with gabapentin and decreased to 300 mg TID and started Cymbalta 20 mg at bedtime on 7/11. Insomnia and scheduled melatonin added. Jerking muscles resolved. Ambulated safely in hallway.  Blood pressures were monitored on TID basis and hydrochlorothiazide 25 mg daily and irbesartan 150 mg daily continued. Switched back to home Benicar 20 mg at discharge.   Diabetes has been monitored with ac/hs CBG checks and SSI was use prn for tighter BS control. Jardiance 10 mg daily and metformin 500 mg BID continued.  Rehab course: During patient's stay in rehab weekly team conferences were held to monitor patient's progress, set goals and discuss barriers to discharge. At admission, patient required supervision with basic self-care skills and IADL and supervision with mobility.   She  has had improvement in activity tolerance, balance, postural control as well as ability to compensate for deficits. She has had improvement in functional use RUE/LUE  and RLE/LLE as well as improvement in awareness  Patient has met 7 of 7 long term goals due to improved activity tolerance, improved balance, improved postural control, increased strength, improved awareness, and improved coordination.  Patient to discharge at an ambulatory level Modified Independent.   Patient's care partner is independent to provide the necessary physical assistance at discharge.   Reasons goals not met: N/A all goals met   Recommendation:  Patient will benefit from ongoing skilled PT services in outpatient setting to continue to advance safe functional mobility, address ongoing impairments in balance, coordination, strength, endurance, and minimize fall risk.  PT/OT outpatient therapy arranged at [facility]  Discharge disposition: 01-Home or Self  Care     Diet: carb modified  Special Instructions: No driving, alcohol consumption or tobacco use.  30-35 minutes were spent on discharge planning and discharge summary.  Discharge Instructions     Ambulatory referral to Occupational Therapy   Complete by: As directed    Eval and treat   Ambulatory referral to Physical Therapy   Complete by: As directed    Eval and treat   Discharge patient   Complete by: As directed    Discharge disposition: 01-Home or Self Care   Discharge patient date: 01/24/2023      Allergies as of 01/24/2023       Reactions   Baclofen Other (See Comments)   Severe muscle jerking   Augmentin [amoxicillin-pot Clavulanate] Itching, Other (See Comments)   Severe yeast infection   Tape Itching   Now using a rubbery type tape.        Medication List     STOP taking these medications    oxyCODONE-acetaminophen 5-325 MG tablet Commonly known as: PERCOCET/ROXICET       TAKE these medications    acetaminophen 325 MG tablet Commonly known as: TYLENOL Take 1-2 tablets (325-650 mg total) by mouth every 4 (four) hours as needed for mild pain.   aspirin EC 81 MG tablet Take 1 tablet (81 mg total) by mouth daily. Swallow whole.   atorvastatin 80 MG tablet Commonly known as: LIPITOR Take 1 tablet (80 mg total) by mouth daily.   cholecalciferol 25 MCG (1000 UNIT) tablet Commonly known as: VITAMIN D3 Take 1,000 Units  by mouth daily.   cyanocobalamin 1000 MCG tablet Commonly known as: VITAMIN B12 Take 1,000 mcg by mouth daily.   dexamethasone 2 MG tablet Commonly known as: DECADRON Take 1 tablet (2 mg total) by mouth daily. Start taking on: January 25, 2023   DULoxetine 20 MG capsule Commonly known as: CYMBALTA Take 1 capsule (20 mg total) by mouth at bedtime.   fluticasone 50 MCG/ACT nasal spray Commonly known as: FLONASE USE 2 SPRAYS IN EACH NOSTRIL EVERY DAY What changed:  when to take this reasons to take this   gabapentin 300  MG capsule Commonly known as: NEURONTIN Take 1 capsule (300 mg total) by mouth 3 (three) times daily.   hydrochlorothiazide 25 MG tablet Commonly known as: HYDRODIURIL TAKE 1 TABLET EVERY DAY (NEED MD APPOINTMENT FOR REFILLS) What changed: See the new instructions.   Jardiance 10 MG Tabs tablet Generic drug: empagliflozin Take 1 tablet (10 mg total) by mouth daily.   melatonin 5 MG Tabs Take 1 tablet (5 mg total) by mouth at bedtime.   metFORMIN 500 MG tablet Commonly known as: GLUCOPHAGE Take 1 tablet (500 mg total) by mouth 2 (two) times daily with a meal. What changed:  medication strength See the new instructions.   morphine 15 MG 12 hr tablet Commonly known as: MS Contin Take 1 tablet (15 mg total) by mouth every 12 (twelve) hours.   olmesartan 20 MG tablet Commonly known as: BENICAR Take 1 tablet (20 mg total) by mouth at bedtime.   polyethylene glycol 17 g packet Commonly known as: MIRALAX / GLYCOLAX Take 17 g by mouth daily as needed for mild constipation.   polyvinyl alcohol 1.4 % ophthalmic solution Commonly known as: LIQUIFILM TEARS Place 1 drop into both eyes daily as needed (dry eyes).   senna 8.6 MG Tabs tablet Commonly known as: SENOKOT Take 1 tablet (8.6 mg total) by mouth 2 (two) times daily.   zinc sulfate 220 (50 Zn) MG capsule Take 220 mg by mouth daily.        Follow-up Information     Celine Mans, MD Follow up.   Specialty: Family Medicine Why: Call the office in 1-2 days to make arrangements for hospital follow-up appointment. Contact information: [address] [place] [address] [phone]         Genice Rouge, MD Follow up.   Specialty: Physical Medicine and Rehabilitation Why: office will call you to arrange your appt (sent) Contact information: [address] [place] [address] [phone]         Coletta Memos, MD Follow up.   Specialty: Neurosurgery Why: Call the office in 1-2 days to  make arrangements for hospital follow-up appointment. Contact information: [address] [place] [address] [phone]                 Signed: Milinda Antis 01/24/2023, 1:13 PM

## 2023-01-18 NOTE — Progress Notes (Signed)
Inpatient Rehabilitation Admission Medication Review by a Pharmacist  A complete drug regimen review was completed for this patient to identify any potential clinically significant medication issues.  High Risk Drug Classes Is patient taking? Indication by Medication  Antipsychotic Yes, as an intravenous medication Prochlorperazine - prn nausea  Anticoagulant No   Antibiotic No   Opioid Yes Oxycodone ER, oxycodone IR - pain  Antiplatelet Yes bASA - PAD  Hypoglycemics/insulin Yes Empagliflozin, metformin, aspart, glargine - DM   Vasoactive Medication Yes Hydrochlorothiazide, irbesartan - HTN  Chemotherapy No   Other Yes Atorvastatin - HLD Baclofen - muscle spasm Vitamin D, vitamin B12, zinc Dexamethasone - inflammation  Fluticasone - allergies Gabapentin - neuropathic pain Lidocaine patch - pain Senna - constipation Diphenhydramine - prn itching Robitussin DM - prn cough Trazodone - prn sleep     Type of Medication Issue Identified Description of Issue Recommendation(s)  Drug Interaction(s) (clinically significant)     Duplicate Therapy     Allergy     No Medication Administration End Date     Incorrect Dose     Additional Drug Therapy Needed     Significant med changes from prior encounter (inform family/care partners about these prior to discharge). PTA medications not resumed: olmesartan, Percocet Consider resuming PTA medications during CIR admit or upon discharge as appropriate   Other       Clinically significant medication issues were identified that warrant physician communication and completion of prescribed/recommended actions by midnight of the next day:  Yes  Name of provider notified for issues identified: Dr Berline Chough   Provider Method of Notification: secure chat   Pharmacist comments:  -Left message for Carly, Dr Sueanne Margarita nurse to clarify LOT for dexamethasone.   Time spent performing this drug regimen review (minutes):  30  [facility],  PharmD, BCPS 01/18/2023 9:45 AM  Addendum 7/11: - Dexamethasone taper now in place; decreasing every 2 days until off.  Dennie Fetters, [place] 01/19/2023 4:49 PM

## 2023-01-18 NOTE — Progress Notes (Signed)
On call provider Delle Reining PA, notified of CBG results. New orders placed and implemented. Follow up lab results notified to on call provider, and new CBG acquired. Pt with no complaints at this time.

## 2023-01-19 DIAGNOSIS — G959 Disease of spinal cord, unspecified: Secondary | ICD-10-CM | POA: Diagnosis not present

## 2023-01-19 LAB — GLUCOSE, CAPILLARY
Glucose-Capillary: 183 mg/dL — ABNORMAL HIGH (ref 70–99)
Glucose-Capillary: 178 mg/dL — ABNORMAL HIGH (ref 70–99)
Glucose-Capillary: 214 mg/dL — ABNORMAL HIGH (ref 70–99)
Glucose-Capillary: 305 mg/dL — ABNORMAL HIGH (ref 70–99)

## 2023-01-19 MED ORDER — GABAPENTIN 300 MG PO CAPS
300.0000 mg | ORAL_CAPSULE | Freq: Three times a day (TID) | ORAL | Status: DC
  Administered 2023-01-19 – 2023-01-24 (×15): 300 mg via ORAL
  Filled 2023-01-19 (×15): qty 1

## 2023-01-19 MED ORDER — DULOXETINE HCL 20 MG PO CPEP
20.0000 mg | ORAL_CAPSULE | Freq: Every day | ORAL | Status: DC
  Administered 2023-01-19 – 2023-01-23 (×5): 20 mg via ORAL
  Filled 2023-01-19 (×5): qty 1

## 2023-01-19 MED ORDER — DEXAMETHASONE 4 MG PO TABS
2.0000 mg | ORAL_TABLET | Freq: Three times a day (TID) | ORAL | Status: AC
  Administered 2023-01-19 – 2023-01-21 (×6): 2 mg via ORAL
  Filled 2023-01-19 (×6): qty 1

## 2023-01-19 MED ORDER — DEXAMETHASONE 4 MG PO TABS
2.0000 mg | ORAL_TABLET | Freq: Every day | ORAL | Status: DC
  Administered 2023-01-24: 2 mg via ORAL
  Filled 2023-01-19: qty 1

## 2023-01-19 MED ORDER — DEXAMETHASONE 4 MG PO TABS
2.0000 mg | ORAL_TABLET | Freq: Two times a day (BID) | ORAL | Status: AC
  Administered 2023-01-21 – 2023-01-23 (×4): 2 mg via ORAL
  Filled 2023-01-19 (×4): qty 1

## 2023-01-19 NOTE — Progress Notes (Signed)
Physical Therapy Session Note  Patient Details  Name: Robin [name] MRN: [id number] Date of Birth: [birth year]  Today's Date: 01/19/2023 PT Individual Time: 1450-1530 PT Individual Time Calculation (min): 40 min   Short Term Goals: Week 1:  PT Short Term Goal 1 (Week 1): STG's = LTG's due to ELOS  Skilled Therapeutic Interventions/Progress Updates:     Pt recd long sitting in bed, bed mobility with supervision Gait CGA-supervision, >150 ft with no AD. Note mild-moderate ataxia with gait, but no formal LOB. Added 4 lb ankle weights for improved proprioception, cueing for increased step height to clear.  Curb step and steps with and without, with CGA overall. Good carryover when weights removed  Pt then spent >20 min learning and performing line dances with min A fading to CGA, including walking fwd and backward at speed, kick on single leg, crossover steps. Improvements with repetition.   Therapy Documentation Precautions:  Precautions Precautions: Fall, Cervical Precaution Comments: no brace per MD Restrictions Weight Bearing Restrictions: No General:       Therapy/Group: Individual Therapy  Juluis Rainier 01/19/2023, 3:36 PM

## 2023-01-19 NOTE — Discharge Instructions (Addendum)
Inpatient Rehab Discharge Instructions  Robin [name] Discharge date and time: 01/24/2023   Activities/Precautions/ Functional Status: Activity: no lifting, driving, or strenuous exercise until cleared by MD Diet: diabetic diet Wound Care: keep wound clean and dry Functional status:  ___ No restrictions     ___ Walk up steps independently ___ 24/7 supervision/assistance   ___ Walk up steps with assistance _x__ Intermittent supervision/assistance  ___ Bathe/dress independently ___ Walk with walker     ___ Bathe/dress with assistance ___ Walk Independently    ___ Shower independently ___ Walk with assistance    _x__ Shower with assistance _x__ No alcohol     ___ Return to work/school ________  Special Instructions: No driving, alcohol consumption or tobacco use.  Recommend checking fingerstick blood sugars four (4) times daily and record. Bring this information with you to follow-up appointment with PCP.  Recommend daily BP measurement in same arm and record time of day. Bring this information with you to follow-up appointment with PCP.   COMMUNITY REFERRALS UPON DISCHARGE:    Outpatient: PT     OT                 Agency: [facility]      Phone: [phone]             Appointment Date/Time: *please expect follow-up within 7-10 business days to schedule your appointment. If you have not received follow-up,  be sure to contact the site directly.*    My questions have been answered and I understand these instructions. I will adhere to these goals and the provided educational materials after my discharge from the hospital.  Patient/Caregiver Signature _______________________________ Date __________  Clinician Signature _______________________________________ Date __________  Please bring this form and your medication list with you to all your follow-up doctor's appointments.

## 2023-01-19 NOTE — Progress Notes (Signed)
Occupational Therapy Session Note  Patient Details  Name: Cyniah Gossard MRN: [id number] Date of Birth: March 18, 1956  Today's Date: 01/19/2023 OT Individual Time: 1305-1400 OT Individual Time Calculation (min): 55 min    Short Term Goals: Week 1:  OT Short Term Goal 1 (Week 1): STG=LTG d/t ELOS  Skilled Therapeutic Interventions/Progress Updates:    OT intervention with focus on functional amb wihtout AD, simulated home mgmt tasks, standing balance, and activity tolerance. Amb without AE-CGA. Pt amb to gym and retrieved laundry basket, filled with linens, and carried basket to day room and placed linens in dirty linen bag. Pt returned basket to gym. Rest breaks as appropriate. Pt with occasional unsteady gait but able to self correct. Balance tasks at mini trampoline on floor and on Airex. CGA when standing on AirEx. Discussed kitchen tasks and edcated on kitchen and home safety. Pt verbalized understanding of all recommendations. Pt returned to room and remained seated EOB. Bed alarm activated. RN present.   Therapy Documentation Precautions:  Precautions Precautions: Fall, Cervical Precaution Comments: no brace per MD Restrictions Weight Bearing Restrictions: No   Pain:  Pt repots pain across posterior neck and upper traps; repositioned   Therapy/Group: Individual Therapy  Rich Brave 01/19/2023, 2:00 PM

## 2023-01-19 NOTE — Progress Notes (Signed)
Physical Therapy Session Note  Patient Details  Name: Robin [name] MRN: [id number] Date of Birth: August 09, 1955  Today's Date: 01/19/2023 PT Individual Time: 0805-0920 and 1025-1050  PT Individual Time Calculation (min): 75 min and 25 min  Short Term Goals: Week 1:  PT Short Term Goal 1 (Week 1): STG's = LTG's due to ELOS  Skilled Therapeutic Interventions/Progress Updates: Pt presented in bed agreeable to therapy with MD present. Pt states unrated pain in B shoulders but improving from yesterday. Performed bed mobility with supervision and use of bed features. Pt ambulated to bathroom and upon standing noted that pt was using bedpan. Discussed use of toilet/BSC during nighttime as pt will not use bedpan at home. Ambulated to bathroom with light CGA and sat at toilet to complete peri-care with set up. Pt then ambulated to sink to complete hand hygiene in standing mod I and returned to EOB to don clothing (brief/pants/shirt) with set up. Pt then ambulated to sink and performed oral hygiene in standing with distant supervision. Pt then ambulated to day room with CGA without AD. Pt participated in several rounds of corn hole for forced use of RUE and incorporating dynamic balance. Pt initially performed on level surface, then progressed to single LE on 4in step (both R then L). Pt was able to demonstrate overall good balance with PTA maintaining light CGA. Pt then worked on Artist through cones narrowly spaced apart without AD. Pt initially required verbal cues for foot placement and to avoid BLE crossing but improved by third round. Pt also participated in toe taps to target on colored dots. Pt was able to complete small lunges to target, forward, laterally, and crossing legs (as well as able to uncross but required increased time). After seated rest pt ambulated back to room at end of session with mild increased sway due to fatigue. Pt ambulated to bathroom where pt had continent  urinary void performing transfers and clothing management and supervision level. Pt left seated EOB at end of session as RN present to administer am meds with current needs met.   Tx2: Pt presented in bed sleeping but easily aroused and agreeable to therapy. Pt states unrated pain in B shoulders no intervention requested. Pt performed bed mobility with supervision and use of bed features. Pt donned shoes with set up and ambulated with light CGA to w/c. Pt transported to [facility] entrance and worked on ambulation in community environment with [facility] as pt had previously used in community. Pt ambulated distances of 200-320ft with light CGA on uneven surfaces and with small up/down grades. Pt demonstrates overall good safety and was able to step without toes catching on ground. During seated rest pt expressed mild frustration regarding changes in sensory in RUE, explained that sensory return may take significantly longer depending on healing with pt verbalizing understanding. Pt transported back to room at end of session and performed ambulatory transfer to bed. Upon sitting pt indicated need for bathroom and ambulated with CGA without AD to toilet completing transfers and LB clothing management with  supervision. Once completed pt returned to bed in same manner as prior and transferred to supine with supervision. Pt left in bed at end of session with bed alarm on, call bell within reach and needs met.      Therapy Documentation Precautions:  Precautions Precautions: Fall, Cervical Precaution Comments: no brace per MD Restrictions Weight Bearing Restrictions: No General:   Vital Signs: Therapy Vitals Temp: 98.6 F (37 C) Temp Source: Oral  Pulse Rate: 71 Resp: 16 BP: 107/88 Patient Position (if appropriate): Sitting Oxygen Therapy SpO2: 100 % O2 Device: Room Air Pain:   Mobility:   Locomotion :    Trunk/Postural Assessment :    Balance:   Exercises:   Other Treatments:       Therapy/Group: Individual Therapy  Josealfredo Adkins 01/19/2023, 4:28 PM

## 2023-01-19 NOTE — Patient Care Conference (Signed)
Inpatient RehabilitationTeam Conference and Plan of Care Update Date: 01/18/2023   Time: 2:46 PM    Patient Name: Robin [name]      Medical Record Number: [id number]  Date of Birth: [birth year] Sex: Female         Room/Bed: 4W05C/4W05C-01 Payor Info: Payor: [employer] MEDICARE / Plan: [employer] MEDICARE HMO / Product Type: *No Product type* /    Admit Date/Time:  01/17/2023  4:44 PM  Primary Diagnosis:  Cervical myelopathy [facility])  Hospital Problems: Principal Problem:   Cervical myelopathy [facility])    Expected Discharge Date: Expected Discharge Date: 01/23/23  Team Members Present: Physician leading conference: Dr. Genice Rouge Social Worker Present: Cecile Sheerer, LCSWA Nurse Present: Vedia Pereyra, RN PT Present: Harless Litten, PTA;Truitt Leep, PT OT Present: Velia Meyer, OT     Current Status/Progress Goal Weekly Team Focus  Bowel/Bladder    Incontinent/continent  of bowel and bladder  Regain continence of bowel and bladder    Time toileting to improve continence.    Swallow/Nutrition/ Hydration               ADL's   SBA for all UB and LB ADLs and transfers with RW, no endurance deficit during ADL session   Mod I   general strength/conditioning, UE ROM/strength reconditioning    Mobility   supervision bed, transfers, gait >350 ft no AD, CGA steps x 12 with 2 HR's   Mod I  dynamic gait to reduce fall risk    Communication                Safety/Cognition/ Behavioral Observations               Pain    Occasional pain to upper shoulders     Less than 3 with PRN medications  Educate on side effects of pain medication and offer alternatives to improve pain and manage pain.    Skin    Skin is intact  Skin will remain intact and free of breakdown  Assess every shift and prn for changes among pressure areas and offload.      Discharge Planning:  Pt will d/c to home with her dtr who will provide 24/7 care. Fam edu scheduled for Friday  (7/12) 8am-10am. Outpatient PT/OT referral will be sent to [facility]. SW will confirm there are no barriers to discharge.   Team Discussion: Cervical myelopathy. Working on strength and reconditioning as well as dynamic gait to reduce rick of fall. Patient on target to meet rehab goals: yes, progressing towards goals with discharge date 01/23/23  *See Care Plan and progress notes for long and short-term goals.   Revisions to Treatment Plan:  Monitor pain, labs, and VS  Teaching Needs: Medications, safety, self care, gait/transfer training, diet modifications, etc  Current Barriers to Discharge: Decreased caregiver support and New diabetic  Possible Resolutions to Barriers: Family education Monitoring blood sugar/diet/lifestyle modifications to improve A1C, HTN, HLD Order recommended DME      Medical Summary Current Status: Incomplete ASIA D quadriplegia- spasticity- continent B/B- CBGs 200-s400s- due to decadron-severe nerve pain  Barriers to Discharge: Spasticity;Self-care education;Weight bearing restrictions;Uncontrolled Pain;Medical stability;Uncontrolled Diabetes  Barriers to Discharge Comments: limited by: nerve pain; spasticity- CBgs- up to400s- Possible Resolutions to Barriers/Weekly Focus: added Semglee and weaning Decadron-adding cymbalta 20 gm QHS for nerve pain- reduced gabapentin due to oversedation; and added baclofen for spasticity- d/c date 7/15   Continued Need for Acute Rehabilitation Level of Care: The patient requires daily medical management  by a physician with specialized training in physical medicine and rehabilitation for the following reasons: Direction of a multidisciplinary physical rehabilitation program to maximize functional independence : Yes Medical management of patient stability for increased activity during participation in an intensive rehabilitation regime.: Yes Analysis of laboratory values and/or radiology reports with any subsequent need  for medication adjustment and/or medical intervention. : Yes   I attest that I was present, lead the team conference, and concur with the assessment and plan of the team.   Jearld Adjutant 01/18/2023, 2:46 PM

## 2023-01-19 NOTE — Progress Notes (Signed)
PROGRESS NOTE   Subjective/Complaints:  Pt reports shoulder pain somewhat better; however also too sedated yesterday- almost fell over, so sleepy.  Also c/o a "tic" or muscle jerking/muscle spasms intermittently.   Of note, not feeling stiff anymore since Baclofen started.  Went over these issue with pt as well as therapy.    ROS:  Pt denies SOB, abd pain, CP, N/V/C/D, and vision changes   Except for HPI Objective:   MR SHOULDER LEFT WO CONTRAST  Result Date: 01/17/2023 CLINICAL DATA:  Left shoulder pain after injury on 01/11/2023 EXAM: MRI OF THE LEFT SHOULDER WITHOUT CONTRAST TECHNIQUE: Multiplanar, multisequence MR imaging of the shoulder was performed. No intravenous contrast was administered. COMPARISON:  MRI 08/09/2011 FINDINGS: Rotator cuff: Moderate rotator cuff tendinosis, most pronounced within the subscapularis and infraspinatus tendons. No rotator cuff tear. Muscles: Mild infraspinatus muscle edema. No rotator cuff muscle atrophy or fatty infiltration. Biceps long head:  Mild intra-articular biceps tendinosis. Acromioclavicular Joint: Moderate degenerative changes of the AC joint. Small volume subacromial-subdeltoid bursal fluid. Glenohumeral Joint: No joint effusion. Mild chondral thinning without focal defect. Labrum: Grossly intact although evaluation is limited in the absence of intra-articular fluid. No paralabral cyst. Bones: No acute fracture. No dislocation. Os acromiale. No bone marrow edema. No marrow replacing bone lesion. Other: None. IMPRESSION: 1. Moderate rotator cuff tendinosis. No rotator cuff tear. 2. Mild infraspinatus muscle edema, which may be related to muscle strain. 3. Mild intra-articular biceps tendinosis. 4. Moderate AC joint osteoarthritis. 5. Mild subacromial-subdeltoid bursitis. Electronically Signed   By: Duanne Guess D.O.   On: 01/17/2023 13:10   Recent Labs    01/18/23 0736  WBC 15.3*   HGB 11.8*  HCT 38.2  PLT 396   Recent Labs    01/18/23 0736 01/18/23 2214  NA 133*  --   K 3.9  --   CL 94*  --   CO2 31  --   GLUCOSE 293* 384*  BUN 34*  --   CREATININE 1.00  --   CALCIUM 9.4  --     Intake/Output Summary (Last 24 hours) at 01/19/2023 0904 Last data filed at 01/18/2023 1800 Gross per 24 hour  Intake 480 ml  Output --  Net 480 ml        Physical Exam: Vital Signs Blood pressure (!) 106/93, pulse 70, temperature 98.3 F (36.8 C), temperature source Oral, resp. rate 16, weight 57.3 kg, last menstrual period 08/11/2011, SpO2 95%.     General: awake, alert, appropriate, sitting up in bed; finished breakfast; NAD HENT: conjugate gaze; oropharynx moist CV: regular rate and rhythm; no JVD Pulmonary: CTA B/L; no W/R/R- good air movement GI: soft, NT, ND, (+)BS- normoactive Psychiatric: appropriate- pleasant Neurological: Ox3 Rolling ball with RUE-  Musculoskeletal:     Comments: RUE- biceps 3+/5; Triceps 4-/5; WE 4-/5; Grip 3+/5 and FA 2-/5- is much better than NSU notes 2 days ago LUE- 4+/5  except FA 4-/5 LE"s 5-/5 in HF/KE/DF/PF and EHL B/L  Impingement signs with empty can test (-) on exam- pain with touching LUE and RUE, however it's more skin/muscle TTP- c/o tingling and burning/nerve pain  Skin:    General: Skin  is warm and dry.     Comments: R hand swelling almost gone  Neurological:     Mental Status: She is alert and oriented to person, place, and time.     Comments: Decreased to light touch slightly in RUE- on exam, LUE and LE's intact to light touch Hofmfan's brisk in RUE; not in LUE No clonus or increased tone found on exam   Assessment/Plan: 1. Functional deficits which require 3+ hours per day of interdisciplinary therapy in a comprehensive inpatient rehab setting. Physiatrist is providing close team supervision and 24 hour management of active medical problems listed below. Physiatrist and rehab team continue to assess barriers  to discharge/monitor patient progress toward functional and medical goals  Care Tool:  Bathing    Body parts bathed by patient: Right arm, Right lower leg, Left lower leg, Left arm, Chest, Face, Abdomen, Front perineal area, Buttocks, Right upper leg, Left upper leg         Bathing assist Assist Level: Supervision/Verbal cueing     Upper Body Dressing/Undressing Upper body dressing   What is the patient wearing?: Pull over shirt    Upper body assist Assist Level: Minimal Assistance - Patient > 75% (Min A bc of tight shirt, anticipate SBA with larger shirt)    Lower Body Dressing/Undressing Lower body dressing      What is the patient wearing?: Underwear/pull up, Pants     Lower body assist Assist for lower body dressing: Supervision/Verbal cueing     Toileting Toileting    Toileting assist Assist for toileting: Supervision/Verbal cueing     Transfers Chair/bed transfer  Transfers assist     Chair/bed transfer assist level: Supervision/Verbal cueing     Locomotion Ambulation   Ambulation assist      Assist level: Supervision/Verbal cueing Assistive device: No Device Max distance: 350 ft   Walk 10 feet activity   Assist     Assist level: Supervision/Verbal cueing Assistive device: No Device   Walk 50 feet activity   Assist    Assist level: Supervision/Verbal cueing Assistive device: No Device    Walk 150 feet activity   Assist    Assist level: Supervision/Verbal cueing Assistive device: No Device    Walk 10 feet on uneven surface  activity   Assist     Assist level: Supervision/Verbal cueing     Wheelchair     Assist Is the patient using a wheelchair?: No             Wheelchair 50 feet with 2 turns activity    Assist            Wheelchair 150 feet activity     Assist          Blood pressure (!) 106/93, pulse 70, temperature 98.3 F (36.8 C), temperature source Oral, resp. rate 16, weight  57.3 kg, last menstrual period 08/11/2011, SpO2 95%.  Medical Problem List and Plan: 1. Functional deficits secondary to traumatic incomplete ASIA D quadriplegia due to ground level fall/severe cervical stenosis             -patient may  shower- cover incision             -ELOS/Goals: 7-10 days supervision to mod I   Con't CIR PT and OT D/c date Monday 2.  Antithrombotics: -DVT/anticoagulation:  Mechanical:  Antiembolism stockings, knee (TED hose) Bilateral lower extremities             -antiplatelet therapy: Aspirin  3. Pain Management: Tylenol as needed             -continue gabapentin 300 mg TID             -continue Oxycontin 15 mg BID             -continue oxycodone prn             Will add lidoderm patches at night             7/10- will increase Gabapentin to 600 mg BID- wants to wait on Lyrica/Cymbalta. 7/11- wean gabapentin to 300 mg TID since got too sedated yesterday and start Cymbalta 20 mg at bedtime- went over risks of nausea.   4. Mood/Behavior/Sleep: LCSW to evaluate and provide emotional support             -antipsychotic agents: n/a   5. Neuropsych/cognition: This patient is capable of making decisions on her own behalf.   6. Skin/Wound Care: Routine skin care checks             -monitor surgical incision   7. Fluids/Electrolytes/Nutrition: Routine Is and Os and follow-up chemistries             -continue vitamin D3, B12, zinc   8: Hypertension: monitor TID and prn             -continue hydrochlorothiazide 25 mg daily             -continue irbesartan 150 mg daily   7/11- BP controlled- actually a little soft- will monitor trend- con't regimen 9: Hyperlipidemia: continue statin   10: DM-2: CBGs QID; A1c = 7.2%             -continue SSI             -continue Jardiance 10 mg daily             -continue metformin 500 mg BID   7/10- Cbgs 200-280's- due to dexamethasone- don't have an end date- so wil d/w pt about starting scheduled insulin for now  7/11- Cbgs  200s-400s- started insulin 10 units Semglee- will reduce Cbgs as steroids wean.  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  12: Moderate left rotator cuff tendinosis, mild intra-articular biceps tendinosis. moderate AC joint osteoarthritis and mild subacromial-subdeltoid bursitis   13. Nerve pain- most likely the cause of pain- also has Trigger points- con't Robaxin prn- but likely can do trigger point injections if pt wants them Thursday   7/10- will increase Gabapentin to 600 mg BID- if tolerated, will increase to TID after a few days.  14. Spasticity?  7/10- has Hoffman's RUE- will change Robaxin to Baclofen 5 mg TID  7/11- having what sounds like muscle spasms/spasticity related- don't want to stop Baclofen as a result.   I spent a total of 53   minutes on total care today- >50% coordination of care- due to  D/w PA about CBGs as well as pharmacy-  Also prolonged period with pt and complex medical issues with Cbg's up to >400 last evening. Also d/w pt's therapist this AM   LOS: 2 days A FACE TO FACE EVALUATION WAS PERFORMED  Dareen Gutzwiller 01/19/2023, 9:04 AM

## 2023-01-20 DIAGNOSIS — G959 Disease of spinal cord, unspecified: Secondary | ICD-10-CM | POA: Diagnosis not present

## 2023-01-20 LAB — GLUCOSE, CAPILLARY
Glucose-Capillary: 187 mg/dL — ABNORMAL HIGH (ref 70–99)
Glucose-Capillary: 185 mg/dL — ABNORMAL HIGH (ref 70–99)
Glucose-Capillary: 215 mg/dL — ABNORMAL HIGH (ref 70–99)
Glucose-Capillary: 289 mg/dL — ABNORMAL HIGH (ref 70–99)

## 2023-01-20 MED ORDER — BACLOFEN 10 MG PO TABS
10.0000 mg | ORAL_TABLET | Freq: Three times a day (TID) | ORAL | Status: DC
  Administered 2023-01-20 – 2023-01-22 (×6): 10 mg via ORAL
  Filled 2023-01-20 (×6): qty 1
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
## 2023-01-20 NOTE — Progress Notes (Signed)
Physical Therapy Session Note  Patient Details  Name: Robin [name] MRN: [id number] Date of Birth: 05-18-1956  Today's Date: 01/20/2023 PT Individual Time: 0900-1000, 1300-1415 PT Individual Time Calculation (min): 60 min, 75 min   Short Term Goals: Week 1:  PT Short Term Goal 1 (Week 1): STG's = LTG's due to ELOS  Skilled Therapeutic Interventions/Progress Updates:    Session 1: Pt recd sitting EOB with her daughter present. Reports continued, unchanged pain in the tops of her shoulders, premedicated. Rest and positioning provided as needed. Session focused on family education.   Pt performed Sit to stand and Stand pivot transfer at supervision-mod I level, gait >250 ft with cane at supervision level. Car and stair navigation with supervision.   Education focused on use of Single point cane, as pt has used Single point cane and no AD PTA and during rehab course. Discussed using for longer distances, in novel environments/known unlevel terrain, or when fatigued. Discussed encouraging increased ambulation at home and in community vs relying on scooter and when each is appropriate.   Pt then ambulated ~ 15-18 minutes with supervision, with and without Single point cane. Pt with x1 episode of LOB, but was able to catch completely independently. Pt returned to room and remained sitting up in bed with her daughter present.   Session 2: pt received in bed and agreeable to therapy. Pt with pain in the tops of both shoulders, R>L, ~8/10 with mobility. premedicated. Rest and positioning provided as needed.   Session focused on community integration and gait in community environment. Pt ambulated with Single point cane and supervision throughout. Navigated curving steps, long inclines, tight spaces in gift shop, etc. Pt ambulated ~60 minutes with x 3 short seated rest breaks and several short standing rest breaks. Pt reports inclines bother her hip, but this is premorbid.   Returned to  unit an utilized nustep x 8 minutes at level 6, discontinued d/t pain in shoulders. Pt returned to room and used bathroom with distant supervision, was left in bed with all needs in reach and alarm active.        Therapy Documentation Precautions:  Precautions Precautions: Fall, Cervical Precaution Comments: no brace per MD Restrictions Weight Bearing Restrictions: No General:       Therapy/Group: Individual Therapy  Juluis Rainier 01/20/2023, 10:54 AM

## 2023-01-20 NOTE — Progress Notes (Signed)
Patient ID: Robin [name], female   DOB: 17-May-1956, 67 y.o.   MRN: [id number]  SW faxed outpatient PT/OT referral to [facility] (p:[phone]/f:[phone]).  Cecile Sheerer, MSW, LCSWA Office: [phone] Cell: [phone] Fax: [phone]

## 2023-01-20 NOTE — Progress Notes (Signed)
Occupational Therapy Session Note  Patient Details  Name: Cantrice Stults MRN: [id number] Date of Birth: 1955-12-04  Today's Date: 01/20/2023 OT Individual Time: 1015-1045 OT Individual Time Calculation (min): 30 min    Short Term Goals: Week 1:  OT Short Term Goal 1 (Week 1): STG=LTG d/t ELOS  Skilled Therapeutic Interventions/Progress Updates:    OT intervention with focus on functional amb with RW, BUE function, discharge planning, and safety awareness. Pt amb with SPC to day room.   9 Hole Peg Test is used to measure finger dexterity in pts with various neurological diagnoses. - Instructions The pt was instructed to pick up the pegs one at a time, using their dominant hand first and put them into the holes in any order until the holes were all filled. The pt then removed the pegs one at a time and returned them to the container. Both hands were tested separately.  - Results The pt completed the test in 22.62 (LUE) and 3169 (RUE) seconds. Scores are based on the time taken to complete the activity. The timer started the moment the pt touched the first peg until the moment the last peg hit the container. - Norms for healthy females ages 61-70+ 38-55 R 17.38 L 18.92 56-60 R 17.86 L 19.48 61-65 R 18.99 L 20.33 66-70 R 19.90 L 21.44 71+ R 22.49 L 24.11  Pt issued foam tubing for use with writing instruments. Pt pleased and reported improvement with adaptation. Pt instructed to keep practicing. Pt returned to room and remained seated EOB with daughter present. Bed alarm activated. All needs within reach.    Therapy Documentation Precautions:  Precautions Precautions: Fall, Cervical Precaution Comments: no brace per MD Restrictions Weight Bearing Restrictions: No   Pain: Pain Assessment Pain Scale: 0-10 Pain Score: 0-No pain   Therapy/Group: Individual Therapy  Rich Brave 01/20/2023, 12:17 PM

## 2023-01-20 NOTE — IPOC Note (Signed)
Overall Plan of Care [facility]) Patient Details Name: Robin [name] MRN: [id number] DOB: Jul 18, 1955  Admitting Diagnosis: Cervical myelopathy [facility])  Hospital Problems: Principal Problem:   Cervical myelopathy (HCC)     Functional Problem List: Nursing Bowel, Pain, Endurance, Medication Management, Safety  PT Balance, Pain, Sensory, Motor  OT Balance, Motor  SLP    TR         Basic ADL's: OT Eating, Grooming, Bathing, Dressing, Toileting     Advanced  ADL's: OT Simple Meal Preparation     Transfers: PT Bed Mobility, Bed to Chair, Car  OT Toilet, Tub/Shower     Locomotion: PT Ambulation     Additional Impairments: OT Fuctional Use of Upper Extremity  SLP        TR      Anticipated Outcomes Item Anticipated Outcome  Self Feeding Mod I  Swallowing      Basic self-care  Mod I  Toileting  Mod I   Bathroom Transfers Mod I  Bowel/Bladder  manage bowel w mod I assist  Transfers  Mod I  Locomotion  Mod I  Communication     Cognition     Pain  < 4 with prns  Safety/Judgment  manage w cues   Therapy Plan: PT Intensity: Minimum of 1-2 x/day ,45 to 90 minutes PT Frequency: 5 out of 7 days PT Duration Estimated Length of Stay: 3-5 days OT Intensity: Minimum of 1-2 x/day, 45 to 90 minutes OT Frequency: 5 out of 7 days OT Duration/Estimated Length of Stay: 3-5 days     Team Interventions: Nursing Interventions Disease Management/Prevention, Medication Management, Discharge Planning, Bowel Management, Patient/Family Education, Pain Management  PT interventions Ambulation/gait training, Warden/ranger, Community reintegration, Neuromuscular re-education, Equities trader education, Museum/gallery curator, Therapeutic Exercise, UE/LE Coordination activities, UE/LE Strength taining/ROM, Therapeutic Activities, Pain management, Functional mobility training, DME/adaptive equipment instruction  OT Interventions Balance/vestibular training, Discharge  planning, Pain management, Therapeutic Activities, UE/LE Coordination activities, Self Care/advanced ADL retraining, Functional mobility training, Therapeutic Exercise, Community reintegration, DME/adaptive equipment instruction, Neuromuscular re-education, UE/LE Strength taining/ROM  SLP Interventions    TR Interventions    SW/CM Interventions Discharge Planning, Psychosocial Support, Patient/Family Education   Barriers to Discharge MD  Medical stability, Home enviroment access/loayout, Wound care, Lack of/limited family support, Weight, and Weight bearing restrictions  Nursing Decreased caregiver support, Home environment access/layout 1 level 3 ste bil rails; taking care of spouse PTA  PT Other (comments) pt to discharge to hotel 2/2 home renovations  OT      SLP      SW Decreased caregiver support, Lack of/limited family support, Community education officer for SNF coverage     Team Discharge Planning: Destination: PT-Home ,OT- Home , SLP-  Projected Follow-up: PT-Home health PT, OT-  Home health OT, SLP-  Projected Equipment Needs: PT-To be determined, OT- To be determined, SLP-  Equipment Details: PT-owns single point cane for community mobility, OT-Pt reports ordering all equipment through husband's [employer] benefits (3-1 commode, bed rails, shower seat) Patient/family involved in discharge planning: PT- Patient,  OT-Patient, SLP-   MD ELOS: 5-7 days Medical Rehab Prognosis:  Excellent Assessment: The patient has been admitted for CIR therapies with the diagnosis of central cord syndrome. The team will be addressing functional mobility, strength, stamina, balance, safety, adaptive techniques and equipment, self-care, bowel and bladder mgt, patient and caregiver education, . Goals have been set at mod I. Anticipated discharge destination is home with family. .        See Team Conference Notes  for weekly updates to the plan of care

## 2023-01-20 NOTE — Progress Notes (Signed)
Met with patient. Reports to be ready for discharge and feels she has everything she needs. AVS will be provided at discharge by PA. Will include medications and follow up appointments. Was checking blood sugars at home, encouraged to continue to be active. All needs met, call bell in reach.

## 2023-01-20 NOTE — Progress Notes (Signed)
PROGRESS NOTE   Subjective/Complaints:  Pt reports spasms/jerking is "softer" since started Baclofen, but not gone- still bothersome.   Nerve pain back to where is was-but rubbing arms helps some.   Trace nausea this AM- avoided eggs as a result- but is resolved already once eggs not "in face'.   Doesn't know if due to duloxetine or eggs.   Frustrated with self "not in step" when dancing yesterday.       ROS:   Pt denies SOB, abd pain, CP, N/V/C/D, and vision changes  Except for HPI Objective:   No results found. Recent Labs    01/18/23 0736  WBC 15.3*  HGB 11.8*  HCT 38.2  PLT 396   Recent Labs    01/18/23 0736 01/18/23 2214  NA 133*  --   K 3.9  --   CL 94*  --   CO2 31  --   GLUCOSE 293* 384*  BUN 34*  --   CREATININE 1.00  --   CALCIUM 9.4  --     Intake/Output Summary (Last 24 hours) at 01/20/2023 1021 Last data filed at 01/20/2023 0758 Gross per 24 hour  Intake 480 ml  Output --  Net 480 ml        Physical Exam: Vital Signs Blood pressure 133/72, pulse 78, temperature 97.9 F (36.6 C), temperature source Oral, resp. rate 16, weight 57.3 kg, last menstrual period 08/11/2011, SpO2 100%.      General: awake, alert, appropriate, sitting EOB, finished breakfast; NAD HENT: conjugate gaze; oropharynx moist CV: regular rate and rhythm; no JVD Pulmonary: CTA B/L; no W/R/R- good air movement GI: soft, NT, ND, (+)BS- normoactive Psychiatric: appropriate- joking some Neurological: Ox3 Didn't see any spasms, but pt reports never occurs when someone in room, but jerks her "around"- softer with baclofen.  Musculoskeletal:     Comments: RUE- biceps 3+/5; Triceps 4-/5; WE 4-/5; Grip 3+/5 and FA 2-/5- is much better than NSU notes 2 days ago LUE- 4+/5  except FA 4-/5 LE"s 5-/5 in HF/KE/DF/PF and EHL B/L  Impingement signs with empty can test (-) on exam- pain with touching LUE and RUE, however  it's more skin/muscle TTP- c/o tingling and burning/nerve pain  Skin:    General: Skin is warm and dry.     Comments: R hand swelling almost gone  Neurological:     Mental Status: She is alert and oriented to person, place, and time.     Comments: Decreased to light touch slightly in RUE- on exam, LUE and LE's intact to light touch Hofmfan's brisk in RUE; not in LUE No clonus or increased tone found on exam   Assessment/Plan: 1. Functional deficits which require 3+ hours per day of interdisciplinary therapy in a comprehensive inpatient rehab setting. Physiatrist is providing close team supervision and 24 hour management of active medical problems listed below. Physiatrist and rehab team continue to assess barriers to discharge/monitor patient progress toward functional and medical goals  Care Tool:  Bathing    Body parts bathed by patient: Right arm, Right lower leg, Left lower leg, Left arm, Chest, Face, Abdomen, Front perineal area, Buttocks, Right upper leg, Left upper leg  Bathing assist Assist Level: Supervision/Verbal cueing     Upper Body Dressing/Undressing Upper body dressing   What is the patient wearing?: Pull over shirt    Upper body assist Assist Level: Independent    Lower Body Dressing/Undressing Lower body dressing      What is the patient wearing?: Underwear/pull up, Pants     Lower body assist Assist for lower body dressing: Independent with assitive device     Toileting Toileting    Toileting assist Assist for toileting: Independent with assistive device     Transfers Chair/bed transfer  Transfers assist     Chair/bed transfer assist level: Supervision/Verbal cueing     Locomotion Ambulation   Ambulation assist      Assist level: Supervision/Verbal cueing Assistive device: No Device Max distance: 350 ft   Walk 10 feet activity   Assist     Assist level: Supervision/Verbal cueing Assistive device: No Device    Walk 50 feet activity   Assist    Assist level: Supervision/Verbal cueing Assistive device: No Device    Walk 150 feet activity   Assist    Assist level: Supervision/Verbal cueing Assistive device: No Device    Walk 10 feet on uneven surface  activity   Assist     Assist level: Supervision/Verbal cueing     Wheelchair     Assist Is the patient using a wheelchair?: No             Wheelchair 50 feet with 2 turns activity    Assist            Wheelchair 150 feet activity     Assist          Blood pressure 133/72, pulse 78, temperature 97.9 F (36.6 C), temperature source Oral, resp. rate 16, weight 57.3 kg, last menstrual period 08/11/2011, SpO2 100%.  Medical Problem List and Plan: 1. 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  Antithrombotics: -DVT/anticoagulation:  Mechanical:  Antiembolism stockings, knee (TED hose) Bilateral lower extremities             -antiplatelet therapy: Aspirin    3. Pain Management: Tylenol as needed             -continue gabapentin 300 mg TID             -continue Oxycontin 15 mg BID             -continue oxycodone prn             Will add lidoderm patches at night             7/10- will increase Gabapentin to 600 mg BID- wants to wait on Lyrica/Cymbalta. 7/11- wean gabapentin to 300 mg TID since got too sedated yesterday and start Cymbalta 20 mg at bedtime- went over risks of nausea.   7/12- will increase duloxetine to 40 mg at bedtime on Monday- will written Rx to increase to 60 mg after 1 week.  4. Mood/Behavior/Sleep: LCSW to evaluate and provide emotional support             -antipsychotic agents: n/a   5. Neuropsych/cognition: This  patient is  capable of making decisions on her own behalf.   6. Skin/Wound Care: Routine skin care checks             -monitor surgical incision   7. Fluids/Electrolytes/Nutrition: Routine Is and Os and follow-up chemistries             -continue vitamin D3, B12, zinc   8: Hypertension: monitor TID and prn             -continue hydrochlorothiazide 25 mg daily             -continue irbesartan 150 mg daily   7/11- BP controlled- actually a little soft- will monitor trend- con't regimen  7/12- BP controlled- con't regimen 9: Hyperlipidemia: continue statin   10: DM-2: CBGs QID; A1c = 7.2%             -continue SSI             -continue Jardiance 10 mg daily             -continue metformin 500 mg BID   7/10- Cbgs 200-280's- due to dexamethasone- don't have an end date- so wil d/w pt about starting scheduled insulin for now  7/11- Cbgs 200s-400s- started insulin 10 units Semglee- will reduce Cbgs as steroids wean.   7/12- CBG's 170's to low 300's- will con't Semglee until d/c Monday.  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  12: Moderate left rotator cuff tendinosis, mild intra-articular biceps tendinosis. moderate AC joint osteoarthritis and mild subacromial-subdeltoid bursitis   13. Nerve pain- most likely the cause of pain- also has Trigger points- con't Robaxin prn- but likely can do trigger point injections if pt wants them Thursday   7/10- will increase Gabapentin to 600 mg BID- if tolerated, will increase to TID after a few days.   7/12- trace nausea this Am, but "could have been the eggs". Feeling good now- will not increase gabapentin- didn't tolerate- too sedated. Will increase cymbalta to 40 mg at bedtime on Monday.  14. Spasticity?  7/10- has Hoffman's RUE- will change Robaxin to Baclofen 5 mg TID  7/11- having what sounds like muscle spasms/spasticity  related- don't want to stop Baclofen as a result.   7/12- will try increasing baclofen to 10 mg TID- if doesn't help muscle jerking- which it's not clear if spasticity spasms or something else, then would reduce back to 5 mg TID  I spent a total of 51   minutes on total care today- >50% coordination of care- due to  D/w pt and pharmacy about spasticity as well as prolonged time in room- 30 minutes, just in room this AM-also IPOC  LOS: 3 days A FACE TO FACE EVALUATION WAS PERFORMED  Karn Derk 01/20/2023, 10:21 AM

## 2023-01-20 NOTE — Progress Notes (Signed)
Occupational Therapy Session Note  Patient Details  Name: Robin [name] MRN: [id number] Date of Birth: 13-Feb-1956  Today's Date: 01/20/2023 OT Individual Time: 8469-6295 OT Individual Time Calculation (min): 43 min    Short Term Goals: Week 1:  OT Short Term Goal 1 (Week 1): STG=LTG d/t ELOS  Skilled Therapeutic Interventions/Progress Updates:    OT intervention with focus on dressing, functional amb without AD, family educaiton, discharge planning, shower transfers, and safety awareness. Pt amb in room to gather clothing prior to entering bathroom to get dressed. Supervision/mod I. Pt returned to room and stood at sink to brush teeth. [name] enetered room for education. Reviewed pt's progress and current function-supervision/mod I. Reviewed kitchen safety and home safety recommendations. Pt and daughter verbalized understanding of recommendations. Practiced walk in shower transfer. Pt has all necessary DME. Pt pleased with progress and looking forward to retuning home on 7/15.   Therapy Documentation Precautions:  Precautions Precautions: Fall, Cervical Precaution Comments: no brace per MD Restrictions Weight Bearing Restrictions: No   Pain:  Pt reports posterior neck and upper traps pain; meds admin during session   Therapy/Group: Individual Therapy  Rich Brave 01/20/2023, 9:02 AM

## 2023-01-21 DIAGNOSIS — R739 Hyperglycemia, unspecified: Secondary | ICD-10-CM

## 2023-01-21 DIAGNOSIS — R252 Cramp and spasm: Secondary | ICD-10-CM

## 2023-01-21 LAB — GLUCOSE, CAPILLARY
Glucose-Capillary: 188 mg/dL — ABNORMAL HIGH (ref 70–99)
Glucose-Capillary: 182 mg/dL — ABNORMAL HIGH (ref 70–99)
Glucose-Capillary: 212 mg/dL — ABNORMAL HIGH (ref 70–99)
Glucose-Capillary: 165 mg/dL — ABNORMAL HIGH (ref 70–99)

## 2023-01-21 MED ORDER — MELATONIN 5 MG PO TABS
5.0000 mg | ORAL_TABLET | Freq: Every day | ORAL | Status: DC
  Administered 2023-01-21 – 2023-01-23 (×2): 5 mg via ORAL
  Filled 2023-01-21 (×3): qty 1

## 2023-01-21 NOTE — Progress Notes (Signed)
PROGRESS NOTE   Subjective/Complaints:  Pt slept alright, wakes up frequently. Having cat naps during the day. Had a fall this morning, states her leg jerked/spasmed and she slid down to the ground, tried to use sink to help. No injuries and nothing hurting from this.  Pain well controlled. LBM yesterday. Urinating fine. Denies any other complaints or concerns today.      ROS:   Pt denies SOB, abd pain, CP, N/V/C/D, and vision changes  Except for HPI   Objective:   No results found. No results for input(s): "WBC", "HGB", "HCT", "PLT" in the last 72 hours.  Recent Labs    01/18/23 2214  GLUCOSE 384*    Intake/Output Summary (Last 24 hours) at 01/21/2023 1216 Last data filed at 01/21/2023 0708 Gross per 24 hour  Intake 1076 ml  Output 0 ml  Net 1076 ml        Physical Exam: Vital Signs Blood pressure (!) 98/51, pulse 68, temperature 97.6 F (36.4 C), temperature source Oral, resp. rate 20, weight 57.3 kg, last menstrual period 08/11/2011, SpO2 100%.   General: awake, alert, appropriate, sitting up in bed; NAD HENT: conjugate gaze; oropharynx moist CV: regular rate and rhythm; no JVD Pulmonary: CTA B/L; no W/R/R- good air movement GI: soft, NT, ND, (+)BS- normoactive Psychiatric: appropriate Neurological: Ox3 MsK: no spasms noted. No tenderness to palpation of spine, hips, extremities. No acute deformities or areas of crepitus. No obvious bruises or signs of injury.   PRIOR EXAMS: Musculoskeletal:     Comments: RUE- biceps 3+/5; Triceps 4-/5; WE 4-/5; Grip 3+/5 and FA 2-/5- is much better than NSU notes 2 days ago LUE- 4+/5  except FA 4-/5 LE"s 5-/5 in HF/KE/DF/PF and EHL B/L  Impingement signs with empty can test (-) on exam- pain with touching LUE and RUE, however it's more skin/muscle TTP- c/o tingling and burning/nerve pain  Skin:    General: Skin is warm and dry.     Comments: R hand swelling almost  gone  Neurological:     Mental Status: She is alert and oriented to person, place, and time.     Comments: Decreased to light touch slightly in RUE- on exam, LUE and LE's intact to light touch Hofmfan's brisk in RUE; not in LUE No clonus or increased tone found on exam   Assessment/Plan: 1. Functional deficits which require 3+ hours per day of interdisciplinary therapy in a comprehensive inpatient rehab setting. Physiatrist is providing close team supervision and 24 hour management of active medical problems listed below. Physiatrist and rehab team continue to assess barriers to discharge/monitor patient progress toward functional and medical goals  Care Tool:  Bathing    Body parts bathed by patient: Right arm, Right lower leg, Left lower leg, Left arm, Chest, Face, Abdomen, Front perineal area, Buttocks, Right upper leg, Left upper leg         Bathing assist Assist Level: Supervision/Verbal cueing     Upper Body Dressing/Undressing Upper body dressing   What is the patient wearing?: Pull over shirt    Upper body assist Assist Level: Independent    Lower Body Dressing/Undressing Lower body dressing  What is the patient wearing?: Underwear/pull up, Pants     Lower body assist Assist for lower body dressing: Independent with assitive device     Toileting Toileting    Toileting assist Assist for toileting: Independent with assistive device     Transfers Chair/bed transfer  Transfers assist     Chair/bed transfer assist level: Supervision/Verbal cueing     Locomotion Ambulation   Ambulation assist      Assist level: Supervision/Verbal cueing Assistive device: No Device Max distance: 350 ft   Walk 10 feet activity   Assist     Assist level: Supervision/Verbal cueing Assistive device: No Device   Walk 50 feet activity   Assist    Assist level: Supervision/Verbal cueing Assistive device: No Device    Walk 150 feet  activity   Assist    Assist level: Supervision/Verbal cueing Assistive device: No Device    Walk 10 feet on uneven surface  activity   Assist     Assist level: Supervision/Verbal cueing     Wheelchair     Assist Is the patient using a wheelchair?: No             Wheelchair 50 feet with 2 turns activity    Assist            Wheelchair 150 feet activity     Assist          Blood pressure (!) 98/51, pulse 68, temperature 97.6 F (36.4 C), temperature source Oral, resp. rate 20, weight 57.3 kg, last menstrual period 08/11/2011, SpO2 100%.  Medical Problem List and Plan: 1. Functional deficits secondary to traumatic incomplete ASIA D quadriplegia due to ground level fall/severe cervical stenosis             -patient may  shower- cover incision             -ELOS/Goals: 7-10 days supervision to mod I   Con't CIR PT and OT- doing great with therapy D/c date Monday 01/23/23 -01/21/23 had a fall this morning, no injuries noted, advised to call for assistance 2.  Antithrombotics: -DVT/anticoagulation:  Mechanical:  Antiembolism stockings, knee (TED hose) Bilateral lower extremities             -antiplatelet therapy: Aspirin 81mg  QD   3. Pain Management: Tylenol as needed             -continue gabapentin 300 mg TID             -continue Oxycontin 15 mg BID             -continue oxycodone prn             Will add lidoderm patches at night             7/10- will increase Gabapentin to 600 mg BID- wants to wait on Lyrica/Cymbalta. 7/11- wean gabapentin to 300 mg TID since got too sedated yesterday and start Cymbalta 20 mg at bedtime- went over risks of nausea.   7/12- will increase duloxetine to 40 mg at bedtime on Monday- will written Rx to increase to 60 mg after 1 week.  4. Mood/Behavior/Sleep: LCSW to evaluate and provide emotional support             -antipsychotic agents: n/a  -01/21/23 added melatonin 5mg  nightly for sleep, has PRN trazodone also    5. Neuropsych/cognition: This patient is capable of making decisions on her own behalf.   6. Skin/Wound Care: Routine skin care checks             -  monitor surgical incision   7. Fluids/Electrolytes/Nutrition: Routine Is and Os and follow-up chemistries             -continue vitamin D3, B12, zinc   8: Hypertension: monitor TID and prn             -continue hydrochlorothiazide 25 mg daily             -continue irbesartan 150 mg daily   7/11- BP controlled- actually a little soft- will monitor trend- con't regimen  7/12- BP controlled- con't regimen 9: Hyperlipidemia: continue lipitor 80mg  QD   10: DM-2: CBGs QID; A1c = 7.2%             -continue SSI             -continue Jardiance 10 mg daily             -continue metformin 500 mg BID   7/10- Cbgs 200-280's- due to dexamethasone- don't have an end date- so wil d/w pt about starting scheduled insulin for now  7/11- Cbgs 200s-400s- started insulin 10 units Semglee- will reduce Cbgs as steroids wean.   7/12- CBG's 170's to low 300's- will con't Semglee until d/c Monday.   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  12: Moderate left rotator cuff tendinosis, mild intra-articular biceps tendinosis. moderate AC joint osteoarthritis and mild subacromial-subdeltoid bursitis   13. Nerve pain- most likely the cause of pain- also has Trigger points- con't Robaxin prn- but likely can do trigger point injections if pt wants them Thursday   7/10- will increase Gabapentin to 600 mg BID- if tolerated, will increase to TID after a few days.   7/12- trace nausea this Am, but "could have been the eggs". Feeling good now- will not increase gabapentin- didn't tolerate- too  sedated. Will increase cymbalta to 40 mg at bedtime on Monday.  14. Spasticity?  7/10- has Hoffman's RUE- will change Robaxin to Baclofen 5 mg TID  7/11- having what sounds like muscle spasms/spasticity related- don't want to stop Baclofen as a result.   7/12- will try increasing baclofen to 10 mg TID- if doesn't help muscle jerking- which it's not clear if spasticity spasms or something else, then would reduce back to 5 mg TID  -01/21/23 had fall this morning from "jerking", not witnessed, no injuries; continue baclofen for now, hopefully it'll help.     LOS: 4 days A FACE TO FACE EVALUATION WAS PERFORMED  [id number] Sonam Huelsmann 01/21/2023, 12:16 PM

## 2023-01-22 DIAGNOSIS — K5901 Slow transit constipation: Secondary | ICD-10-CM

## 2023-01-22 LAB — CBC WITH DIFFERENTIAL/PLATELET
Abs Immature Granulocytes: 0.09 10*3/uL — ABNORMAL HIGH (ref 0.00–0.07)
Basophils Absolute: 0 10*3/uL (ref 0.0–0.1)
Basophils Relative: 0 %
Eosinophils Absolute: 0 10*3/uL (ref 0.0–0.5)
Eosinophils Relative: 0 %
HCT: 39.6 % (ref 36.0–46.0)
Hemoglobin: 12.6 g/dL (ref 12.0–15.0)
Immature Granulocytes: 1 %
Lymphocytes Relative: 22 %
Lymphs Abs: 2.9 10*3/uL (ref 0.7–4.0)
MCH: 28.8 pg (ref 26.0–34.0)
MCHC: 31.8 g/dL (ref 30.0–36.0)
MCV: 90.4 fL (ref 80.0–100.0)
Monocytes Absolute: 1 10*3/uL (ref 0.1–1.0)
Monocytes Relative: 8 %
Neutro Abs: 9.4 10*3/uL — ABNORMAL HIGH (ref 1.7–7.7)
Neutrophils Relative %: 69 %
Platelets: 460 10*3/uL — ABNORMAL HIGH (ref 150–400)
RBC: 4.38 MIL/uL (ref 3.87–5.11)
RDW: 14.5 % (ref 11.5–15.5)
WBC: 13.4 10*3/uL — ABNORMAL HIGH (ref 4.0–10.5)
nRBC: 0 % (ref 0.0–0.2)

## 2023-01-22 LAB — MAGNESIUM: Magnesium: 1.8 mg/dL (ref 1.7–2.4)

## 2023-01-22 LAB — BASIC METABOLIC PANEL
Anion gap: 9 (ref 5–15)
BUN: 32 mg/dL — ABNORMAL HIGH (ref 8–23)
CO2: 29 mmol/L (ref 22–32)
Calcium: 9.1 mg/dL (ref 8.9–10.3)
Chloride: 96 mmol/L — ABNORMAL LOW (ref 98–111)
Creatinine, Ser: 1.01 mg/dL — ABNORMAL HIGH (ref 0.44–1.00)
GFR, Estimated: >60 mL/min (ref >60)
Glucose, Bld: 205 mg/dL — ABNORMAL HIGH (ref 70–99)
Potassium: 3.8 mmol/L (ref 3.5–5.1)
Sodium: 134 mmol/L — ABNORMAL LOW (ref 135–145)

## 2023-01-22 LAB — GLUCOSE, CAPILLARY
Glucose-Capillary: 145 mg/dL — ABNORMAL HIGH (ref 70–99)
Glucose-Capillary: 307 mg/dL — ABNORMAL HIGH (ref 70–99)
Glucose-Capillary: 171 mg/dL — ABNORMAL HIGH (ref 70–99)
Glucose-Capillary: 134 mg/dL — ABNORMAL HIGH (ref 70–99)
Glucose-Capillary: 281 mg/dL — ABNORMAL HIGH (ref 70–99)

## 2023-01-22 MED ORDER — POLYETHYLENE GLYCOL 3350 17 G PO PACK
17.0000 g | PACK | Freq: Every day | ORAL | Status: DC
  Administered 2023-01-22 – 2023-01-24 (×3): 17 g via ORAL
  Filled 2023-01-22 (×3): qty 1

## 2023-01-22 MED ORDER — BACLOFEN 5 MG HALF TABLET
5.0000 mg | ORAL_TABLET | Freq: Three times a day (TID) | ORAL | Status: DC
  Administered 2023-01-22: 5 mg via ORAL
  Filled 2023-01-22: qty 1

## 2023-01-22 MED ORDER — IRBESARTAN 75 MG PO TABS
75.0000 mg | ORAL_TABLET | Freq: Every day | ORAL | Status: DC
  Administered 2023-01-23 – 2023-01-24 (×2): 75 mg via ORAL
  Filled 2023-01-22 (×2): qty 1

## 2023-01-22 NOTE — Plan of Care (Signed)
  Problem: RH Eating Goal: LTG Patient will perform eating w/assist, cues/equip (OT) Description: LTG: Patient will perform eating with assist, with/without cues using equipment (OT) Outcome: Completed/Met   Problem: RH Grooming Goal: LTG Patient will perform grooming w/assist,cues/equip (OT) Description: LTG: Patient will perform grooming with assist, with/without cues using equipment (OT) Outcome: Completed/Met   Problem: RH Bathing Goal: LTG Patient will bathe all body parts with assist levels (OT) Description: LTG: Patient will bathe all body parts with assist levels (OT) Outcome: Completed/Met   Problem: RH Dressing Goal: LTG Patient will perform upper body dressing (OT) Description: LTG Patient will perform upper body dressing with assist, with/without cues (OT). Outcome: Completed/Met Goal: LTG Patient will perform lower body dressing w/assist (OT) Description: LTG: Patient will perform lower body dressing with assist, with/without cues in positioning using equipment (OT) Outcome: Completed/Met   Problem: RH Toileting Goal: LTG Patient will perform toileting task (3/3 steps) with assistance level (OT) Description: LTG: Patient will perform toileting task (3/3 steps) with assistance level (OT)  Outcome: Completed/Met   Problem: RH Functional Use of Upper Extremity Goal: LTG Patient will use RT/LT upper extremity as a (OT) Description: LTG: Patient will use right/left upper extremity as a stabilizer/gross assist/diminished/nondominant/dominant level with assist, with/without cues during functional activity (OT) Outcome: Completed/Met   Problem: RH Simple Meal Prep Goal: LTG Patient will perform simple meal prep w/assist (OT) Description: LTG: Patient will perform simple meal prep with assistance, with/without cues (OT). Outcome: Completed/Met   Problem: RH Toilet Transfers Goal: LTG Patient will perform toilet transfers w/assist (OT) Description: LTG: Patient will perform  toilet transfers with assist, with/without cues using equipment (OT) Outcome: Completed/Met   Problem: RH Tub/Shower Transfers Goal: LTG Patient will perform tub/shower transfers w/assist (OT) Description: LTG: Patient will perform tub/shower transfers with assist, with/without cues using equipment (OT) Outcome: Completed/Met
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
## 2023-01-22 NOTE — Progress Notes (Signed)
Physical Therapy Session Note  Patient Details  Name: Robin [name] MRN: [id number] Date of Birth: Jul 12, 1955  Today's Date: 01/22/2023 PT Individual Time: 2440-1027 and 2536-6440  PT Individual Time Calculation (min): 40 min and 70 min  Short Term Goals: Week 1:  PT Short Term Goal 1 (Week 1): STG's = LTG's due to ELOS  Skilled Therapeutic Interventions/Progress Updates: Pt presented in bed agreeable to therapy. Pt on facetime with dgt and both expressed concern regarding d/c as pt states has been more "shaky" than previously. Pt did drop phone x 2 and also indicated that she fell yesterday. Pt completed supine to sit mod I with increased time. Pt performed Sit to stand with CGA but had several "jerking" movements once in standing that made pt fearful to initiate stand pivot. Pt returned to sitting and was able to complete a step pivot transfer to w/c with CGA and without AD. Pt transported to day room and set up on Cybex Kinetron  at 40cm/sec x 5 min. Pt required frequent redirection as pt would complete 4-3 cycles before falling asleep. Pt transported back to room at end of session and completed stand pivot transfer HHA to bed. Pt transferred to bed mod I and transferred to supine mod I. Pt left in bed at end of session with bed alarm on, call bell within reach and needs met.   Tx2: Pt presented in bed agreeable to therapy. Pt continues to demonstrate significant lethargy and required increased time to complete lunch and perform all activities this session. Nursing student arrived to take vitals prior to OOB mobility. Pt continues to voice concern that she does not feel ready for d/c, reinforced that we will advise MD and ensure that she has a safe d/c. Performed bed mobility mod I and ambulated with SPC to bathroom. Pt with continent urinary void and was able to stand and pull up pants with supervision. Pt began walking with SPC and movements became more unstedy and delayed. Pt lost grasp  of SPC and stood still for a moment with pt stating needing to sit. PTA provided HHA to back step to toilet and sat with CGA. After 1-2 min pt states felt better and was able to complete stand pivot transfer to w/c. Pt then transported to day room and discussed current status. Reviewed d/c information with pt frequently nodding off. PTA moved pt to NuStep and performed HHA stand pivot transfer to NuStep. Participated in L3 x 11:30 for general conditioning and cardiovascular activity. Pt then ambulated with SPC 71ft and 28ft with pt CGA due to increased lateral sway. Pt transported remaining distance to room and from doorway was able to ambulate with CGA and SPC. At bedside pt transferred to supine mod I and was able to reposition to comfort. Pt left in bed at end of session with bed alarm on, call bell within reach and needs met.      Therapy Documentation Precautions:  Precautions Precautions: Fall, Cervical Precaution Comments: no brace per MD Restrictions Weight Bearing Restrictions: No General:   Vital Signs: Therapy Vitals Temp: 98.6 F (37 C) Pulse Rate: 63 BP: 98/72 Patient Position (if appropriate): Sitting Oxygen Therapy SpO2: 100 % O2 Device: Room Air Pain: Pain Assessment Pain Scale: 0-10 Pain Score: 5  Pain Location: Shoulder Pain Orientation: Other (Comment) (Bilateral)    Therapy/Group: Individual Therapy  Boubacar Lerette 01/22/2023, 4:32 PM

## 2023-01-22 NOTE — Progress Notes (Cosign Needed)
Patient very lethargic during physical assessment.  Was very alert prior to.  Lung sounds normal and clear on all fields.  Skin dry, intact.  Surgical wound dry and intact.  BS hypoactive in upper quadrants and lower right quadrant, absent in lower left quadrant.  Respirations even and unlabored, RR 10. Nurse was advised.  Pulses equal bilaterally, slow bpm 58. Pain score 5

## 2023-01-22 NOTE — Progress Notes (Signed)
PROGRESS NOTE   Subjective/Complaints:  Pt slept well.  Pain well controlled. LBM 2 days ago. Urinating fine.  [name] concerned with pt being discharged tomorrow, pt stating that she gets "jerky" and then feels unsteady. Rosita PTA stating that today she is not at the level she was previously, and noted a jerking episode when she stood up this morning.  Pt also feels woozy from baclofen.  Otherwise, denies any other complaints or concerns today.      ROS: +spasms/jerkiness  Pt denies SOB, abd pain, CP, N/V/C/D, and vision changes  Except for HPI   Objective:   No results found. No results for input(s): "WBC", "HGB", "HCT", "PLT" in the last 72 hours.  No results for input(s): "NA", "K", "CL", "CO2", "GLUCOSE", "BUN", "CREATININE", "CALCIUM" in the last 72 hours.   Intake/Output Summary (Last 24 hours) at 01/22/2023 1128 Last data filed at 01/22/2023 0745 Gross per 24 hour  Intake 420 ml  Output --  Net 420 ml        Physical Exam: Vital Signs Blood pressure (!) 101/47, pulse 63, temperature 97.6 F (36.4 C), temperature source Oral, resp. rate 18, weight 57.3 kg, last menstrual period 08/11/2011, SpO2 99%.   General: awake, alert, appropriate, sitting up in bed then seen transferring into w/c; NAD HENT: conjugate gaze; oropharynx moist CV: regular rate and rhythm; no JVD Pulmonary: CTA B/L; no W/R/R- good air movement GI: soft, NT, ND, (+)BS- normoactive Psychiatric: appropriate Neurological: Ox3 MsK: no spasms noted but pt slow to initiate movement.   PRIOR EXAMS: Musculoskeletal:     Comments: RUE- biceps 3+/5; Triceps 4-/5; WE 4-/5; Grip 3+/5 and FA 2-/5- is much better than [facility] notes 2 days ago LUE- 4+/5  except FA 4-/5 LE"s 5-/5 in HF/KE/DF/PF and EHL B/L  Impingement signs with empty can test (-) on exam- pain with touching LUE and RUE, however it's more skin/muscle TTP- c/o tingling and  burning/nerve pain  Skin:    General: Skin is warm and dry.     Comments: R hand swelling almost gone  Neurological:     Mental Status: She is alert and oriented to person, place, and time.     Comments: Decreased to light touch slightly in RUE- on exam, LUE and LE's intact to light touch Hofmfan's brisk in RUE; not in LUE No clonus or increased tone found on exam   Assessment/Plan: 1. Functional deficits which require 3+ hours per day of interdisciplinary therapy in a comprehensive inpatient rehab setting. Physiatrist is providing close team supervision and 24 hour management of active medical problems listed below. Physiatrist and rehab team continue to assess barriers to discharge/monitor patient progress toward functional and medical goals  Care Tool:  Bathing    Body parts bathed by patient: Right arm, Right lower leg, Left lower leg, Left arm, Chest, Face, Abdomen, Front perineal area, Buttocks, Right upper leg, Left upper leg         Bathing assist Assist Level: Supervision/Verbal cueing     Upper Body Dressing/Undressing Upper body dressing   What is the patient wearing?: Pull over shirt    Upper body assist Assist Level: Independent  Lower Body Dressing/Undressing Lower body dressing      What is the patient wearing?: Underwear/pull up, Pants     Lower body assist Assist for lower body dressing: Independent with assitive device     Toileting Toileting    Toileting assist Assist for toileting: Independent with assistive device     Transfers Chair/bed transfer  Transfers assist     Chair/bed transfer assist level: Independent with assistive device Chair/bed transfer assistive device: Theatre manager   Ambulation assist      Assist level: Independent with assistive device Assistive device: Cane-straight Max distance: 359ft   Walk 10 feet activity   Assist     Assist level: Independent with assistive device Assistive  device: Cane-straight   Walk 50 feet activity   Assist    Assist level: Independent with assistive device Assistive device: Cane-straight    Walk 150 feet activity   Assist    Assist level: Independent with assistive device Assistive device: Cane-straight    Walk 10 feet on uneven surface  activity   Assist     Assist level: Independent with assistive device Assistive device: Cane-straight   Wheelchair     Assist Is the patient using a wheelchair?: No             Wheelchair 50 feet with 2 turns activity    Assist    Wheelchair 50 feet with 2 turns activity did not occur: N/A       Wheelchair 150 feet activity     Assist  Wheelchair 150 feet activity did not occur: N/A       Blood pressure (!) 101/47, pulse 63, temperature 97.6 F (36.4 C), temperature source Oral, resp. rate 18, weight 57.3 kg, last menstrual period 08/11/2011, SpO2 99%.  Medical Problem List and Plan: 1. 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 Berline Chough would be by to see them in the morning and could further discuss d/c concerns.  2.  Antithrombotics: -DVT/anticoagulation:  Mechanical:  Antiembolism stockings, knee (TED hose) Bilateral lower extremities             -antiplatelet therapy: Aspirin 81mg  QD   3. Pain Management: Tylenol as needed             -continue gabapentin 300 mg TID             -continue Oxycontin 15 mg BID              -continue oxycodone prn             -Will add lidoderm patches at night -7/10- will increase Gabapentin to 600 mg BID- wants to wait on Lyrica/Cymbalta. -7/11- wean gabapentin to 300 mg TID since got too sedated yesterday and start Cymbalta 20 mg at bedtime- went over risks of nausea.   -7/12- will increase duloxetine to 40 mg at bedtime on  Monday- will written Rx to increase to 60 mg after 1 week.  4. Mood/Behavior/Sleep: LCSW to evaluate and provide emotional support             -antipsychotic agents: n/a  -01/21/23 added melatonin 5mg  nightly for sleep, has PRN trazodone also  -01/22/23 slept better   5. Neuropsych/cognition: This patient is capable of making decisions on her own behalf.   6. Skin/Wound Care: Routine skin care checks             -monitor surgical incision   7. Fluids/Electrolytes/Nutrition: Routine Is and Os and follow-up chemistries             -continue vitamin D3, B12, zinc   8: Hypertension: monitor TID and prn             -continue hydrochlorothiazide 25 mg daily             -continue irbesartan 150 mg daily   7/11- BP controlled- actually a little soft- will monitor trend- con't regimen  7/12- BP controlled- con't regimen -01/22/23 BPs a little soft, will decrease irbesartan to 75mg  QD starting tomorrow Vitals:   01/19/23 0343 01/19/23 1416 01/19/23 1944 01/20/23 0459  BP: (!) 106/93 107/88 (!) 126/55 133/72   01/20/23 1437 01/20/23 2005 01/21/23 0357 01/21/23 0944  BP: 120/64 (!) 105/50 125/80 (!) 98/51   01/21/23 0954 01/21/23 1447 01/21/23 1920 01/22/23 0413  BP: (!) 98/51 (!) 98/55 (!) 111/49 (!) 101/47    9: Hyperlipidemia: continue lipitor 80mg  QD   10: DM-2: CBGs QID; A1c = 7.2%             -continue SSI             -continue Jardiance 10 mg daily             -continue metformin 500 mg BID -7/10- Cbgs 200-280's- due to dexamethasone- don't have an end date- so wil d/w pt about starting scheduled insulin for now -7/11- Cbgs 200s-400s- started  insulin 10 units Semglee- will reduce Cbgs as steroids wean.   -7/12- CBG's 170's to low 300's- will con't Semglee until d/c Monday.   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 -01/22/23 more unsteady as mentioned above; could be from weaning steroid? See below for plan regarding spasticity/jerking  12: Moderate left rotator cuff tendinosis, mild intra-articular biceps tendinosis. moderate AC joint osteoarthritis and mild subacromial-subdeltoid bursitis   13. Nerve pain- most likely the cause of pain- also has Trigger points- con't Robaxin prn- but likely can do trigger point injections if pt wants them Thursday -7/10- will increase Gabapentin to 600 mg BID- if tolerated, will increase to TID after a few days.  -7/12- trace nausea this Am, but "could have been the eggs". Feeling good now- will not increase gabapentin- didn't tolerate- too sedated. Will increase cymbalta to 40 mg at bedtime on Monday.   14. Spasticity?  -7/10- has Hoffman's RUE- will change Robaxin to Baclofen 5 mg TID -7/11- having what  sounds like muscle spasms/spasticity related- don't want to stop Baclofen as a result.  -7/12- will try increasing baclofen to 10 mg TID- if doesn't help muscle jerking- which it's not clear if spasticity spasms or something else, then would reduce back to 5 mg TID -01/21/23 had fall this morning from "jerking", not witnessed, no injuries; continue baclofen for now, hopefully it'll help.  -01/22/23 continues to have jerkiness, will decrease baclofen back down to 5mg  TID since she's having side effects from it but  hasn't noticed much improvement, will also get CBC/BMP/Mg level to verify no other etiology of her symptoms-- pending.  15. Constipation: -01/22/23 no BM in 2 days, will add on Miralax QD in addition to PRNs, monitor  I spent >4mins performing patient care related activities, including face to face time, documentation time, med management, discussion of meds with patient and nursing staff, discussion with PTA and pt regarding discharge concerns, and overall coordination of care.     LOS: 5 days A FACE TO FACE EVALUATION WAS PERFORMED  [address] 01/22/2023, 11:28 AM

## 2023-01-22 NOTE — Progress Notes (Signed)
Patient very lethargic during physical assessment.  Was very alert prior to.  Lung sounds normal and clear on all fields.  Skin dry, intact.  Surgical wound dry and intact.  BS hypoactive in upper quadrants and lower right quadrant, absent in lower left quadrant.  Respirations even and unlabored, RR 10. Nurse was advised.  Pulses equal bilaterally Pain score 5  I agree with this student's documentation.

## 2023-01-22 NOTE — Progress Notes (Cosign Needed)
Assessed patient's vital signs at about 1315 BP 92/60, HR 70, SpO2 100%, respirations 10. Pt's nurse was advised.  Pain 5-6, tolerable per patient.

## 2023-01-23 LAB — URINALYSIS, ROUTINE W REFLEX MICROSCOPIC
Bacteria, UA: NONE SEEN
Bilirubin Urine: NEGATIVE
Glucose, UA: >=500 mg/dL — AB
Hgb urine dipstick: NEGATIVE
Ketones, ur: NEGATIVE mg/dL
Leukocytes,Ua: NEGATIVE
Nitrite: NEGATIVE
Protein, ur: NEGATIVE mg/dL
Specific Gravity, Urine: 1.022 (ref 1.005–1.030)
pH: 6 (ref 5.0–8.0)

## 2023-01-23 LAB — BASIC METABOLIC PANEL
Anion gap: 7 (ref 5–15)
BUN: 35 mg/dL — ABNORMAL HIGH (ref 8–23)
CO2: 30 mmol/L (ref 22–32)
Calcium: 9 mg/dL (ref 8.9–10.3)
Chloride: 96 mmol/L — ABNORMAL LOW (ref 98–111)
Creatinine, Ser: 1.19 mg/dL — ABNORMAL HIGH (ref 0.44–1.00)
GFR, Estimated: 50 mL/min — ABNORMAL LOW (ref >60)
Glucose, Bld: 186 mg/dL — ABNORMAL HIGH (ref 70–99)
Potassium: 4.4 mmol/L (ref 3.5–5.1)
Sodium: 133 mmol/L — ABNORMAL LOW (ref 135–145)

## 2023-01-23 LAB — CBC
HCT: 35.1 % — ABNORMAL LOW (ref 36.0–46.0)
Hemoglobin: 11.2 g/dL — ABNORMAL LOW (ref 12.0–15.0)
MCH: 29.5 pg (ref 26.0–34.0)
MCHC: 31.9 g/dL (ref 30.0–36.0)
MCV: 92.4 fL (ref 80.0–100.0)
Platelets: 419 10*3/uL — ABNORMAL HIGH (ref 150–400)
RBC: 3.8 MIL/uL — ABNORMAL LOW (ref 3.87–5.11)
RDW: 14.6 % (ref 11.5–15.5)
WBC: 13.7 10*3/uL — ABNORMAL HIGH (ref 4.0–10.5)
nRBC: 0 % (ref 0.0–0.2)

## 2023-01-23 LAB — GLUCOSE, CAPILLARY
Glucose-Capillary: 214 mg/dL — ABNORMAL HIGH (ref 70–99)
Glucose-Capillary: 144 mg/dL — ABNORMAL HIGH (ref 70–99)
Glucose-Capillary: 254 mg/dL — ABNORMAL HIGH (ref 70–99)
Glucose-Capillary: 201 mg/dL — ABNORMAL HIGH (ref 70–99)

## 2023-01-23 NOTE — Progress Notes (Signed)
PROGRESS NOTE   Subjective/Complaints:  Pt a little more dry Last night, so sedated, couldn't talk to nursing, but heard them.   Larey Seat Saturday Said shaking was real bad- Thinks baclofen causing her to "doze off"-   U/A (-) when checked today.   ROS: +spasms/jerkiness- slightly less this AM per pt  Pt denies SOB, abd pain, CP, N/V/C/D, and vision changes  Except for HPI   Objective:   No results found. Recent Labs    01/22/23 1556 01/23/23 0642  WBC 13.4* 13.7*  HGB 12.6 11.2*  HCT 39.6 35.1*  PLT 460* 419*    Recent Labs    01/22/23 1127 01/23/23 0642  NA 134* 133*  K 3.8 4.4  CL 96* 96*  CO2 29 30  GLUCOSE 205* 186*  BUN 32* 35*  CREATININE 1.01* 1.19*  CALCIUM 9.1 9.0     Intake/Output Summary (Last 24 hours) at 01/23/2023 1802 Last data filed at 01/23/2023 1300 Gross per 24 hour  Intake 840 ml  Output --  Net 840 ml        Physical Exam: Vital Signs Blood pressure (!) 109/53, pulse 77, temperature 98.1 F (36.7 C), temperature source Oral, resp. rate 16, weight 57.3 kg, last menstrual period 08/11/2011, SpO2 100%.    General: awake, alert, appropriate,  sitting EOB; very slightly delayed responses; also spoke with daughter on facetime; NAD HENT: conjugate gaze; oropharynx moist CV: regular rate and rhythm; no JVD Pulmonary: CTA B/L; no W/R/R- good air movement GI: soft, NT, ND, (+)BS- normoactive Psychiatric: appropriate but very slightly delayed Neurological: Ox3 No spasms/jerking of hers seen, but she describes it well  PRIOR EXAMS: Musculoskeletal:     Comments: RUE- biceps 3+/5; Triceps 4-/5; WE 4-/5; Grip 3+/5 and FA 2-/5- is much better than [facility] notes 2 days ago LUE- 4+/5  except FA 4-/5 LE"s 5-/5 in HF/KE/DF/PF and EHL B/L  Impingement signs with empty can test (-) on exam- pain with touching LUE and RUE, however it's more skin/muscle TTP- c/o tingling and burning/nerve  pain  Skin:    General: Skin is warm and dry.     Comments: R hand swelling almost gone  Neurological:     Mental Status: She is alert and oriented to person, place, and time.     Comments: Decreased to light touch slightly in RUE- on exam, LUE and LE's intact to light touch Hofmfan's brisk in RUE; not in LUE No clonus or increased tone found on exam   Assessment/Plan: 1. Functional deficits which require 3+ hours per day of interdisciplinary therapy in a comprehensive inpatient rehab setting. Physiatrist is providing close team supervision and 24 hour management of active medical problems listed below. Physiatrist and rehab team continue to assess barriers to discharge/monitor patient progress toward functional and medical goals  Care Tool:  Bathing    Body parts bathed by patient: Right arm, Right lower leg, Left lower leg, Left arm, Chest, Face, Abdomen, Front perineal area, Buttocks, Right upper leg, Left upper leg         Bathing assist Assist Level: Independent with assistive device     Upper Body Dressing/Undressing Upper body dressing  What is the patient wearing?: Pull over shirt    Upper body assist Assist Level: Independent    Lower Body Dressing/Undressing Lower body dressing      What is the patient wearing?: Underwear/pull up, Pants     Lower body assist Assist for lower body dressing: Independent with assitive device     Toileting Toileting    Toileting assist Assist for toileting: Independent with assistive device     Transfers Chair/bed transfer  Transfers assist     Chair/bed transfer assist level: Independent with assistive device Chair/bed transfer assistive device: Theatre manager   Ambulation assist      Assist level: Independent with assistive device Assistive device: Cane-straight Max distance: 339ft   Walk 10 feet activity   Assist     Assist level: Independent with assistive device Assistive device:  Cane-straight   Walk 50 feet activity   Assist    Assist level: Independent with assistive device Assistive device: Cane-straight    Walk 150 feet activity   Assist    Assist level: Independent with assistive device Assistive device: Cane-straight    Walk 10 feet on uneven surface  activity   Assist     Assist level: Independent with assistive device Assistive device: Cane-straight   Wheelchair     Assist Is the patient using a wheelchair?: No             Wheelchair 50 feet with 2 turns activity    Assist    Wheelchair 50 feet with 2 turns activity did not occur: N/A       Wheelchair 150 feet activity     Assist  Wheelchair 150 feet activity did not occur: N/A       Blood pressure (!) 109/53, pulse 77, temperature 98.1 F (36.7 C), temperature source Oral, resp. rate 16, weight 57.3 kg, last menstrual period 08/11/2011, SpO2 100%.  Medical Problem List and Plan: 1. 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   2.  Antithrombotics: -DVT/anticoagulation:  Mechanical:  Antiembolism stockings, knee (TED hose) Bilateral lower extremities             -antiplatelet therapy: Aspirin 81mg  QD   3. Pain Management: Tylenol as needed             -continue gabapentin 300 mg TID             -continue Oxycontin 15 mg BID             -continue oxycodone prn             -Will add lidoderm patches at night -7/10- will increase Gabapentin to 600 mg BID- wants to wait on Lyrica/Cymbalta. -7/11- wean  gabapentin to 300 mg TID since got too sedated yesterday and start Cymbalta 20 mg at bedtime- went over risks of nausea.   -7/12- will increase duloxetine to 40 mg at bedtime on Monday- will written Rx to  increase to 60 mg after 1 week.  7/15- will wait to increase Duloxetine 4. Mood/Behavior/Sleep: LCSW to evaluate and provide emotional support             -antipsychotic agents: n/a  -01/21/23 added melatonin 5mg  nightly for sleep, has PRN trazodone also  -01/22/23 slept better   7/15- too sedated overnight- baclofen? 5. Neuropsych/cognition: This patient is capable of making decisions on her own behalf.   6. Skin/Wound Care: Routine skin care checks             -monitor surgical incision   7. Fluids/Electrolytes/Nutrition: Routine Is and Os and follow-up chemistries             -continue vitamin D3, B12, zinc   8: Hypertension: monitor TID and prn             -continue hydrochlorothiazide 25 mg daily             -continue irbesartan 150 mg daily   7/11- BP controlled- actually a little soft- will monitor trend- con't regimen  7/12- BP controlled- con't regimen -01/22/23 BPs a little soft, will decrease irbesartan to 75mg  QD starting tomorrow 7/15- BP still low but first day of lower losartan dose Vitals:   01/20/23 2005 01/21/23 0357 01/21/23 0944 01/21/23 0954  BP: (!) 105/50 125/80 (!) 98/51 (!) 98/51   01/21/23 1447 01/21/23 1920 01/22/23 0413 01/22/23 1300  BP: (!) 98/55 (!) 111/49 (!) 101/47 92/60   01/22/23 1335 01/22/23 1933 01/23/23 0438 01/23/23 1320  BP: 98/72 (!) 103/55 90/64 (!) 109/53    9: Hyperlipidemia: continue lipitor 80mg  QD   10: DM-2: CBGs QID; A1c = 7.2%             -continue SSI             -continue Jardiance 10 mg daily             -continue metformin 500 mg BID -7/10- Cbgs 200-280's- due to dexamethasone- don't have an end date- so wil d/w pt about starting scheduled insulin for now -7/11- Cbgs 200s-400s- started insulin 10 units Semglee- will reduce  Cbgs as steroids wean.   -7/12- CBG's 170's to low 300's- will con't Semglee until d/c Monday.   -01/21/23 CBGs ok, improving overall, monitor -01/22/23 CBGs variable, but mostly improving (307 was in between meals, so not as great of significance) 7/15-134 to 254- due to decadron- am reducing it. Don't stop Semglee until leaves.   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 -01/22/23 more unsteady as mentioned above; could be from weaning steroid? See below for plan regarding spasticity/jerking  12: Moderate left rotator cuff tendinosis, mild intra-articular biceps tendinosis. moderate AC joint osteoarthritis and mild subacromial-subdeltoid bursitis   13. Nerve pain- most likely the cause of pain- also has Trigger points- con't Robaxin prn- but likely can do trigger point injections if pt wants them Thursday -7/10- will increase Gabapentin to 600 mg BID- if tolerated, will increase to TID after a few days.  -7/12- trace nausea this Am, but "could have been the eggs". Feeling good now- will not increase gabapentin- didn't tolerate-  too sedated. Will increase cymbalta to 40 mg at bedtime on Monday.   7/15- will wait to increase til we know what's causing her jerking.  14. Spasticity?  -7/10- has Hoffman's RUE- will change Robaxin to Baclofen 5 mg TID -7/11- having what sounds like muscle spasms/spasticity related- don't want to stop Baclofen as a result.  -7/12- will try increasing baclofen to 10 mg TID- if doesn't help muscle jerking- which it's not clear if spasticity spasms or something else, then would reduce back to 5 mg TID -01/21/23 had fall this morning from "jerking", not witnessed, no injuries; continue baclofen for now, hopefully it'll help.  -01/22/23  continues to have jerkiness, will decrease baclofen back down to 5mg  TID since she's having side effects from it but hasn't noticed much improvement, will also get CBC/BMP/Mg level to verify no other etiology of her symptoms-- pending. 7/15- stopped Baclofen to see if was cause of her jerking- if doesn't help- will increase decadron? U/A negative for UTI.   15. Constipation: -01/22/23 no BM in 2 days, will add on Miralax QD in addition to PRNs, monitor 7/15- LBM x2 overnight  I spent a total of 53    minutes on total care today- >50% coordination of care- due to  D/w pt about jerking- d/w tam about focus on how to treat- could be UTI, could be baclofen or could be reduction in decadron- will stop baclofen and see if things get better or worse.    I spent a total of  52  minutes on total care today- >50% coordination of care- due to  Spoke with pt and daughter at length- held d/c- also spoke with nursing and PA about plan; and head of therapy about possibly restarting if Sx's dont improve.   LOS: 6 days A FACE TO FACE EVALUATION WAS PERFORMED  Robin [name] 01/23/2023, 6:02 PM

## 2023-01-23 NOTE — Progress Notes (Signed)
Pt has been very lethargic throughout the night. Pt is not able to stay awake long enough to finish complete sentences with staff. Presenting very unstable to ambulate to the bathroom. Pt stated "I can hear yall talking, but I can't stay awake long enough to talk back" scheduled melatonin not given. No other concerns at this time, bed alarm on and call bell in reach.

## 2023-01-24 ENCOUNTER — Other Ambulatory Visit (HOSPITAL_COMMUNITY): Payer: Self-pay

## 2023-01-24 LAB — URINE CULTURE

## 2023-01-24 LAB — GLUCOSE, CAPILLARY: Glucose-Capillary: 70 mg/dL (ref 70–99)

## 2023-01-24 MED ORDER — ACETAMINOPHEN 325 MG PO TABS: 325.0000 mg | ORAL_TABLET | ORAL | Status: DC | PRN

## 2023-01-24 MED ORDER — DEXAMETHASONE 2 MG PO TABS
2.0000 mg | ORAL_TABLET | Freq: Every day | ORAL | 0 refills | Status: DC
  Filled 2023-01-24: qty 1, 1d supply, fill #0

## 2023-01-24 MED ORDER — METFORMIN HCL 500 MG PO TABS
500.0000 mg | ORAL_TABLET | Freq: Two times a day (BID) | ORAL | 0 refills | Status: DC
  Filled 2023-01-24: qty 60, 30d supply, fill #0

## 2023-01-24 MED ORDER — POLYVINYL ALCOHOL 1.4 % OP SOLN: 1.0000 [drp] | Freq: Every day | OPHTHALMIC | Status: DC | PRN

## 2023-01-24 MED ORDER — MORPHINE SULFATE ER 15 MG PO TBCR
15.0000 mg | EXTENDED_RELEASE_TABLET | Freq: Two times a day (BID) | ORAL | 0 refills | Status: DC
Start: 2023-01-24 — End: 2023-08-07
  Filled 2023-01-24: qty 14, 7d supply, fill #0

## 2023-01-24 MED ORDER — MELATONIN 5 MG PO TABS
5.0000 mg | ORAL_TABLET | Freq: Every day | ORAL | 0 refills | Status: DC
  Filled 2023-01-24: qty 30, 30d supply, fill #0

## 2023-01-24 MED ORDER — SENNA 8.6 MG PO TABS
1.0000 | ORAL_TABLET | Freq: Two times a day (BID) | ORAL | 0 refills | Status: DC
  Filled 2023-01-24: qty 60, 30d supply, fill #0

## 2023-01-24 MED ORDER — EMPAGLIFLOZIN 10 MG PO TABS
10.0000 mg | ORAL_TABLET | Freq: Every day | ORAL | 0 refills | Status: DC
Start: 2023-01-24 — End: 2023-03-01
  Filled 2023-01-24: qty 30, 30d supply, fill #0

## 2023-01-24 MED ORDER — GABAPENTIN 300 MG PO CAPS
300.0000 mg | ORAL_CAPSULE | Freq: Three times a day (TID) | ORAL | 0 refills | Status: DC
  Filled 2023-01-24: qty 90, 30d supply, fill #0

## 2023-01-24 MED ORDER — POLYETHYLENE GLYCOL 3350 17 G PO PACK: 17.0000 g | PACK | Freq: Every day | ORAL | Status: AC | PRN

## 2023-01-24 MED ORDER — DULOXETINE HCL 20 MG PO CPEP
20.0000 mg | ORAL_CAPSULE | Freq: Every day | ORAL | 0 refills | Status: DC
  Filled 2023-01-24: qty 30, 30d supply, fill #0

## 2023-01-24 NOTE — Progress Notes (Signed)
Inpatient Rehabilitation Discharge Medication Review by a Pharmacist  A complete drug regimen review was completed for this patient to identify any potential clinically significant medication issues.  High Risk Drug Classes Is patient taking? Indication by Medication  Antipsychotic No   Anticoagulant No   Antibiotic No   Opioid Yes MS contin - pain  Antiplatelet No   Hypoglycemics/insulin Yes Metformin, Jardiance - DM  Vasoactive Medication Yes Hydrochlorothiazide, Benicar - HTN  Chemotherapy No   Other Yes Dexamethasone - inflammation Duloxetine - mood, pain Gabapentin - pain Atorvastatin - HLD     Type of Medication Issue Identified Description of Issue Recommendation(s)  Drug Interaction(s) (clinically significant)     Duplicate Therapy     Allergy     No Medication Administration End Date     Incorrect Dose     Additional Drug Therapy Needed     Significant med changes from prior encounter (inform family/care partners about these prior to discharge).    Other       Clinically significant medication issues were identified that warrant physician communication and completion of prescribed/recommended actions by midnight of the next day:  No  Pharmacist comments: None  Time spent performing this drug regimen review (minutes): 20 minutes  Thank you Okey Regal, PharmD

## 2023-01-24 NOTE — Progress Notes (Signed)
Inpatient Rehabilitation Care Coordinator Discharge Note   Patient Details  Name: Robin [name] MRN: [id number] Date of Birth: 12/07/1955   Discharge location: D/c to home with support from her daughter  Length of Stay: 6 days  Discharge activity level: Mod I  Home/community participation: Limited  Patient response QM:VHQION Literacy - How often do you need to have someone help you when you read instructions, pamphlets, or other written material from your doctor or pharmacy?: Never  Patient response GE:XBMWUX Isolation - How often do you feel lonely or isolated from those around you?: Never  Services provided included: MD, RD, OT, PT, RN, CM, Neuropsych, SW, Pharmacy, TR  Financial Services:  Field seismologist Utilized: Private Insurance [employer]  Choices offered to/list presented to: patient dtr  Follow-up services arranged:  Outpatient    Outpatient Servicies: [facility] for outpatient  PT/OT      Patient response to transportation need: Is the patient able to respond to transportation needs?: Yes In the past 12 months, has lack of transportation kept you from medical appointments or from getting medications?: No In the past 12 months, has lack of transportation kept you from meetings, work, or from getting things needed for daily living?: No   Patient/Family verbalized understanding of follow-up arrangements:  Yes  Individual responsible for coordination of the follow-up plan: contact pt dtr Robin [name] [phone]  Confirmed correct DME delivered: Robin [name] 01/24/2023    Comments (or additional information): fam edu completed  Summary of Stay    Date/Time Discharge Planning CSW  01/18/23 1611 Pt will d/c to home with her dtr who will provide 24/7 care. Fam edu scheduled for Friday (7/12) 8am-10am. Outpatient PT/OT referral will be sent to [facility]. SW will confirm there are no barriers to discharge. AAC       Robin [name] A  Robin [name]

## 2023-01-24 NOTE — Progress Notes (Signed)
PROGRESS NOTE   Subjective/Complaints:  Shaking is SO much better- she has a few small spasms (from spasticity) but she isn't having big muscle jerking anymore- no tremor anymore and feels stable when walks to bathroom with RW and staff.    ROS:  Pt denies SOB, abd pain, CP, N/V/C/D, and vision changes  Except for HPI   Objective:   No results found. Recent Labs    01/22/23 1556 01/23/23 0642  WBC 13.4* 13.7*  HGB 12.6 11.2*  HCT 39.6 35.1*  PLT 460* 419*    Recent Labs    01/22/23 1127 01/23/23 0642  NA 134* 133*  K 3.8 4.4  CL 96* 96*  CO2 29 30  GLUCOSE 205* 186*  BUN 32* 35*  CREATININE 1.01* 1.19*  CALCIUM 9.1 9.0     Intake/Output Summary (Last 24 hours) at 01/24/2023 0956 Last data filed at 01/24/2023 0726 Gross per 24 hour  Intake 600 ml  Output --  Net 600 ml        Physical Exam: Vital Signs Blood pressure (!) 101/59, pulse 71, temperature 98.2 F (36.8 C), resp. rate 18, weight 57.3 kg, last menstrual period 08/11/2011, SpO2 100%.    General: awake, alert, appropriate, sitting up I bed; ate 100% tray; NAD HENT: conjugate gaze; oropharynx moist CV: regular rate; and rhythm no JVD Pulmonary: CTA B/L; no W/R/R- good air movement GI: soft, NT, ND, (+)BS Psychiatric: appropriate Neurological: Ox3 Mild spasms seen in LE's- but no actual muscle jerking that she described.  PRIOR EXAMS: Musculoskeletal:     Comments: RUE- biceps 3+/5; Triceps 4-/5; WE 4-/5; Grip 3+/5 and FA 2-/5- is much better than NSU notes 2 days ago LUE- 4+/5  except FA 4-/5 LE"s 5-/5 in HF/KE/DF/PF and EHL B/L  Impingement signs with empty can test (-) on exam- pain with touching LUE and RUE, however it's more skin/muscle TTP- c/o tingling and burning/nerve pain  Skin:    General: Skin is warm and dry.     Comments: R hand swelling almost gone  Neurological:     Mental Status: She is alert and oriented to  person, place, and time.     Comments: Decreased to light touch slightly in RUE- on exam, LUE and LE's intact to light touch Hofmfan's brisk in RUE; not in LUE No clonus or increased tone found on exam   Assessment/Plan: 1. Functional deficits which require 3+ hours per day of interdisciplinary therapy in a comprehensive inpatient rehab setting. Physiatrist is providing close team supervision and 24 hour management of active medical problems listed below. Physiatrist and rehab team continue to assess barriers to discharge/monitor patient progress toward functional and medical goals  Care Tool:  Bathing    Body parts bathed by patient: Right arm, Right lower leg, Left lower leg, Left arm, Chest, Face, Abdomen, Front perineal area, Buttocks, Right upper leg, Left upper leg         Bathing assist Assist Level: Independent with assistive device     Upper Body Dressing/Undressing Upper body dressing   What is the patient wearing?: Pull over shirt    Upper body assist Assist Level: Independent    Lower  Body Dressing/Undressing Lower body dressing      What is the patient wearing?: Underwear/pull up, Pants     Lower body assist Assist for lower body dressing: Independent with assitive device     Toileting Toileting    Toileting assist Assist for toileting: Independent with assistive device     Transfers Chair/bed transfer  Transfers assist     Chair/bed transfer assist level: Independent with assistive device Chair/bed transfer assistive device: Theatre manager   Ambulation assist      Assist level: Independent with assistive device Assistive device: Cane-straight Max distance: 358ft   Walk 10 feet activity   Assist     Assist level: Independent with assistive device Assistive device: Cane-straight   Walk 50 feet activity   Assist    Assist level: Independent with assistive device Assistive device: Cane-straight    Walk 150  feet activity   Assist    Assist level: Independent with assistive device Assistive device: Cane-straight    Walk 10 feet on uneven surface  activity   Assist     Assist level: Independent with assistive device Assistive device: Cane-straight   Wheelchair     Assist Is the patient using a wheelchair?: No             Wheelchair 50 feet with 2 turns activity    Assist    Wheelchair 50 feet with 2 turns activity did not occur: N/A       Wheelchair 150 feet activity     Assist  Wheelchair 150 feet activity did not occur: N/A       Blood pressure (!) 101/59, pulse 71, temperature 98.2 F (36.8 C), resp. rate 18, weight 57.3 kg, last menstrual period 08/11/2011, SpO2 100%.  Medical Problem List and Plan: 1. 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   D/c today- d/w daughter for 29 minutes on phone 2.  Antithrombotics: -DVT/anticoagulation:  Mechanical:  Antiembolism stockings, knee (TED hose) Bilateral lower extremities             -antiplatelet therapy: Aspirin 81mg  QD   3. Pain Management: Tylenol as needed             -continue gabapentin 300 mg TID             -continue Oxycontin 15 mg BID             -continue oxycodone prn             -Will add lidoderm patches at night -7/10- will increase Gabapentin to 600 mg BID- wants to wait on Lyrica/Cymbalta. -7/11- wean gabapentin to 300 mg TID since got too sedated yesterday and start Cymbalta 20 mg at bedtime- went over risks of nausea.   -7/12-  will increase duloxetine to 40 mg at bedtime on Monday- will written Rx to increase to 60 mg after 1 week.  7/15- will wait to increase Duloxetine  7/16- wait ot increase Duloxetine at f/u.  4. Mood/Behavior/Sleep: LCSW to evaluate and provide emotional support             -antipsychotic agents: n/a  -01/21/23 added melatonin 5mg  nightly for sleep, has PRN trazodone also  -01/22/23 slept better   7/15- too sedated overnight- baclofen? 5. Neuropsych/cognition: This patient is capable of making decisions on her own behalf.   6. Skin/Wound Care: Routine skin care checks             -monitor surgical incision   7. Fluids/Electrolytes/Nutrition: Routine Is and Os and follow-up chemistries             -continue vitamin D3, B12, zinc   8: Hypertension: monitor TID and prn             -continue hydrochlorothiazide 25 mg daily             -continue irbesartan 150 mg daily   7/11- BP controlled- actually a little soft- will monitor trend- con't regimen  7/12- BP controlled- con't regimen -01/22/23 BPs a little soft, will decrease irbesartan to 75mg  QD starting tomorrow 7/15- BP still low but first day of lower losartan dose 7/16- BP on lower side, but getting better- con't lower losartan dose.  Vitals:   01/21/23 0944 01/21/23 0954 01/21/23 1447 01/21/23 1920  BP: (!) 98/51 (!) 98/51 (!) 98/55 (!) 111/49   01/22/23 0413 01/22/23 1300 01/22/23 1335 01/22/23 1933  BP: (!) 101/47 92/60 98/72  (!) 103/55   01/23/23 0438 01/23/23 1320 01/23/23 2013 01/24/23 0516  BP: 90/64 (!) 109/53 116/63 (!) 101/59    9: Hyperlipidemia: continue lipitor 80mg  QD   10: DM-2: CBGs QID; A1c = 7.2%             -continue SSI             -continue Jardiance 10 mg daily             -continue metformin 500 mg BID -7/10- Cbgs 200-280's- due to dexamethasone- don't have an end date- so wil d/w pt about starting scheduled insulin for now -7/11- Cbgs 200s-400s- started insulin 10 units Semglee- will reduce Cbgs as  steroids wean.   -7/12- CBG's 170's to low 300's- will con't Semglee until d/c Monday.   -01/21/23 CBGs ok, improving overall, monitor -01/22/23 CBGs variable, but mostly improving (307 was in between meals, so not as great of significance) 7/15-134 to 254- due to decadron- am reducing it. Don't stop Semglee until leaves.   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 -01/22/23 more unsteady as mentioned above; could be from weaning steroid? See below for plan regarding spasticity/jerking  12: Moderate left rotator cuff tendinosis, mild intra-articular biceps tendinosis. moderate AC joint osteoarthritis and mild subacromial-subdeltoid bursitis   13. Nerve pain- most likely the cause of pain- also has Trigger points- con't Robaxin prn- but likely can do trigger point injections if pt wants them Thursday -7/10- will increase Gabapentin to 600 mg BID- if tolerated, will increase to TID after a few days.  -  7/12- trace nausea this Am, but "could have been the eggs". Feeling good now- will not increase gabapentin- didn't tolerate- too sedated. Will increase cymbalta to 40 mg at bedtime on Monday.   7/15- will wait to increase til we know what's causing her jerking.  14. Spasticity?  -7/10- has Hoffman's RUE- will change Robaxin to Baclofen 5 mg TID -7/11- having what sounds like muscle spasms/spasticity related- don't want to stop Baclofen as a result.  -7/12- will try increasing baclofen to 10 mg TID- if doesn't help muscle jerking- which it's not clear if spasticity spasms or something else, then would reduce back to 5 mg TID -01/21/23 had fall this morning from "jerking", not witnessed, no  injuries; continue baclofen for now, hopefully it'll help.  -01/22/23 continues to have jerkiness, will decrease baclofen back down to 5mg  TID since she's having side effects from it but hasn't noticed much improvement, will also get CBC/BMP/Mg level to verify no other etiology of her symptoms-- pending. 7/15- stopped Baclofen to see if was cause of her jerking- if doesn't help- will increase decadron? U/A negative for UTI.   7/16- appears ot be Baclofen- muscle jerking has stopped- made an allergy- still has spasticity, but since she has borderline low BP, don't think it's appropriate to start Zanaflex. Went over with family and pt- they agree 15. Constipation: -01/22/23 no BM in 2 days, will add on Miralax QD in addition to PRNs, monitor 7/15- LBM x2 overnight   I spent a total of 47   minutes on total care today- >50% coordination of care- due to  D/w pt and daughter as well as nursing- will have pt walk with nursing in front of family to make sure she does well/doing better- if she does well, she can go home today.  LOS: 7 days A FACE TO FACE EVALUATION WAS PERFORMED  Arietta Eisenstein 01/24/2023, 9:56 AM

## 2023-01-25 ENCOUNTER — Telehealth: Payer: Self-pay

## 2023-01-25 NOTE — Transitions of Care (Post Inpatient/ED Visit) (Signed)
01/25/2023  Name: Robin [name] MRN: [id number] DOB: 1956-05-11  Today's TOC FU Call Status: Today's TOC FU Call Status:: Successful TOC FU Call Competed TOC FU Call Complete Date: 01/25/23  Transition Care Management Follow-up Telephone Call Date of Discharge: 01/24/23 Discharge Facility: Other [facility]) Name of Other ([facility]) [facility] Facility: [facility] Type of Discharge: Inpatient Admission Primary Inpatient Discharge Diagnosis:: spinal cord disease How have you been since you were released from the hospital?: Better Any questions or concerns?: No  Items Reviewed: Did you receive and understand the discharge instructions provided?: Yes Medications obtained,verified, and reconciled?: Yes (Medications Reviewed) Any new allergies since your discharge?: No Dietary orders reviewed?: Yes Do you have support at home?: Yes People in Home: spouse  Medications Reviewed Today: Medications Reviewed Today     Reviewed by Karena Addison, LPN (Licensed Practical Nurse) on 01/25/23 at 1018  Med List Status: <None>   Medication Order Taking? Sig Documenting Provider Last Dose Status Informant  acetaminophen (TYLENOL) 325 MG tablet [id number]  Take 1-2 tablets (325-650 mg total) by mouth every 4 (four) hours as needed for mild pain. Carlos Levering  Active   aspirin EC 81 MG tablet [id number] No Take 1 tablet (81 mg total) by mouth daily. Swallow whole. Celine Mans, MD Past Week Active Self, Pharmacy Records  atorvastatin (LIPITOR) 80 MG tablet [id number] No Take 1 tablet (80 mg total) by mouth daily. Celine Mans, MD Past Week Active Self, Pharmacy Records  cholecalciferol (VITAMIN D3) 25 MCG (1000 UNIT) tablet [id number] No Take 1,000 Units by mouth daily. [provider] Past Week Active Self, Pharmacy Records  dexamethasone (DECADRON) 2 MG tablet [id number]  Take 1 tablet (2 mg total) by mouth daily. Setzer, Lynnell Jude, PA-C  Active    DULoxetine (CYMBALTA) 20 MG capsule [id number]  Take 1 capsule (20 mg total) by mouth at bedtime. Setzer, Lynnell Jude, PA-C  Active   empagliflozin (JARDIANCE) 10 MG TABS tablet [id number]  Take 1 tablet (10 mg total) by mouth daily. Setzer, Lynnell Jude, PA-C  Active   fluticasone (FLONASE) 50 MCG/ACT nasal spray [id number] No USE 2 SPRAYS IN EACH NOSTRIL EVERY DAY  Patient taking differently: Place 2 sprays into both nostrils daily as needed for allergies or rhinitis.   Celine Mans, MD Past Week Active Self, Pharmacy Records  gabapentin (NEURONTIN) 300 MG capsule [id number]  Take 1 capsule (300 mg total) by mouth 3 (three) times daily. Setzer, Lynnell Jude, PA-C  Active   hydrochlorothiazide (HYDRODIURIL) 25 MG tablet [id number] No TAKE 1 TABLET EVERY DAY (NEED MD APPOINTMENT FOR REFILLS)  Patient taking differently: Take 25 mg by mouth daily.   Celine Mans, MD Past Week Active Self, Pharmacy Records  melatonin 5 MG TABS [id number]  Take 1 tablet (5 mg total) by mouth at bedtime. Setzer, Lynnell Jude, PA-C  Active   metFORMIN (GLUCOPHAGE) 500 MG tablet [id number]  Take 1 tablet (500 mg total) by mouth 2 (two) times daily with a meal. Setzer, Lynnell Jude, PA-C  Active   morphine (MS CONTIN) 15 MG 12 hr tablet [id number]  Take 1 tablet (15 mg total) by mouth every 12 (twelve) hours. Setzer, Lynnell Jude, PA-C  Active   olmesartan (BENICAR) 20 MG tablet [id number] No Take 1 tablet (20 mg total) by mouth at bedtime. Celine Mans, MD Past Week Active Self, Pharmacy Records  polyethylene glycol (MIRALAX / GLYCOLAX) 17 g packet [id number]  Take 17 g by mouth daily as needed for mild constipation. Wendi Maya  J, PA-C  Active   polyvinyl alcohol (LIQUIFILM TEARS) 1.4 % ophthalmic solution [id number]  Place 1 drop into both eyes daily as needed (dry eyes). Setzer, Lynnell Jude, PA-C  Active   senna (SENOKOT) 8.6 MG TABS tablet [id number]  Take 1 tablet (8.6 mg total) by mouth 2 (two) times daily. Setzer, Lynnell Jude, PA-C  Active    vitamin B-12 (CYANOCOBALAMIN) 1000 MCG tablet [id number] No Take 1,000 mcg by mouth daily. [provider] Past Week Active Self, Pharmacy Records  zinc sulfate 220 (50 Zn) MG capsule [id number] No Take 220 mg by mouth daily. [provider] Past Week Active Self, Pharmacy Records            Home Care and Equipment/Supplies: Were Home Health Services Ordered?: NA Any new equipment or medical supplies ordered?: NA  Functional Questionnaire: Do you need assistance with bathing/showering or dressing?: No Do you need assistance with meal preparation?: No Do you need assistance with eating?: No Do you have difficulty maintaining continence: No Do you need assistance with getting out of bed/getting out of a chair/moving?: No Do you have difficulty managing or taking your medications?: No  Follow up appointments reviewed: PCP Follow-up appointment confirmed?: Yes Date of PCP follow-up appointment?: 01/31/23 Follow-up Provider: [facility] Follow-up appointment confirmed?: NA Do you need transportation to your follow-up appointment?: No Do you understand care options if your condition(s) worsen?: Yes-patient verbalized understanding    SIGNATURE Karena Addison, LPN [facility] Nurse Health Advisor Direct Dial [phone]

## 2023-01-30 NOTE — Progress Notes (Signed)
    SUBJECTIVE:   CHIEF COMPLAINT / HPI: hospital follow-up  Admitted to [facility] after fall, had surgery for spinal cord decompression. Here for follow-up. Next Tuesday appointment with Neurosurgery. Pain at 6/10 in neck and down arms. Prior to fall was 5/10. Has follow-up with PMR Dr. Berline Chough 08/14. Patient unsure of what cause her fall initially. States pain was helped by Lidocaine patches in hospital.  Chronic pain - Not taking home percocet, because rehab doctors told her not to take that and morphine she was prescribed at discharge. Has now finished morphine and not taken anything for several days.  HTN - Out of blood pressure medications.    PERTINENT  PMH / PSH: PAD, HTN, T2DM, Chronic pain  OBJECTIVE:   BP 129/69   Pulse 98   Ht 5\' 2"  (1.575 m)   Wt 128 lb 9.6 oz (58.3 kg)   LMP 08/11/2011   SpO2 99%   BMI 23.52 kg/m   General: NAD  Neuro: A&O, strength 5/5 left arm, strength 4/5 right arm, tenderness along cervical spine Cardiovascular: RRR, no murmurs, no peripheral edema Respiratory: normal WOB on RA, CTAB, no wheezes, ronchi or rales Extremities: Moving all 4 extremities equally, 2+ radial pulses   ASSESSMENT/PLAN:   Cervical cord compression with myelopathy (HCC) Assessment & Plan: Here for hospital follow-up. Pain mildly controlled, but improving with physical therapy. Patient feels this benefits her greatly from a pain perspective. Recommended continued physical therapy. Ordered Lidocaine patches for patient. Discussed proper use x1 daily, and remove before 12hrs. Patient on home percocet prior to hospitalization for chronic pain. Discussed reducing dose to once daily prn, to which patient is agreeable. -Continue follow-up with Neurosurgery and PMR -Consider referral to pain medicine -Follow-up with me 1 month  Orders: -     Lidocaine; Place 1 patch onto the skin daily. Remove & Discard patch within 12 hours or as directed by MD  Dispense: 30 patch; Refill:  0  Essential hypertension, benign Assessment & Plan: On recheck, patient normotensive. Discussed with patient. Recommended checking BP at home and bring in log while holding current medications. Patient agreeable to plan.  Orders: -     Olmesartan Medoxomil; Take 1 tablet (20 mg total) by mouth at bedtime.  Dispense: 90 tablet; Refill: 0 -     hydroCHLOROthiazide; Take 1 tablet (25 mg total) by mouth daily.  Dispense: 90 tablet; Refill: 0   Return in about 1 month (around 03/03/2023) for BP f/u.  Celine Mans, MD [facility]

## 2023-01-31 ENCOUNTER — Ambulatory Visit: Payer: Medicare HMO | Admitting: Family Medicine

## 2023-01-31 ENCOUNTER — Encounter: Payer: Self-pay | Admitting: Family Medicine

## 2023-01-31 VITALS — BP 129/69 | HR 98 | Ht 62.0 in | Wt 128.6 lb

## 2023-01-31 DIAGNOSIS — G9520 Unspecified cord compression: Secondary | ICD-10-CM | POA: Diagnosis not present

## 2023-01-31 DIAGNOSIS — I10 Essential (primary) hypertension: Secondary | ICD-10-CM | POA: Diagnosis not present

## 2023-01-31 MED ORDER — HYDROCHLOROTHIAZIDE 25 MG PO TABS
25.0000 mg | ORAL_TABLET | Freq: Every day | ORAL | 0 refills | Status: DC
Start: 2023-01-31 — End: 2023-05-31

## 2023-01-31 MED ORDER — LIDOCAINE 5 % EX PTCH
1.0000 | MEDICATED_PATCH | CUTANEOUS | 0 refills | Status: DC
Start: 2023-01-31 — End: 2023-02-16

## 2023-01-31 MED ORDER — OLMESARTAN MEDOXOMIL 20 MG PO TABS
20.0000 mg | ORAL_TABLET | Freq: Every day | ORAL | 0 refills | Status: DC
Start: 2023-01-31 — End: 2023-05-31

## 2023-01-31 MED ORDER — LIDOCAINE 5 % EX PTCH
1.0000 | MEDICATED_PATCH | CUTANEOUS | 0 refills | Status: DC
Start: 2023-01-31 — End: 2023-01-31

## 2023-01-31 NOTE — Patient Instructions (Addendum)
It was great to see you! Thank you for allowing me to participate in your care!  Our plans for today:  - Please STOP taking your blood pressure medication. Please measure at home and bring in a log at your next visit. - Please take your home Percocet as needed for pain, I have the lidoderm patches to your pharmacy.   Please arrive 15 minutes PRIOR to your next scheduled appointment time! If you do not, this affects OTHER patients' care.  Take care and seek immediate care sooner if you develop any concerns.   Celine Mans, MD, PGY-2 [facility] 2:04 PM 01/31/2023  [facility]

## 2023-01-31 NOTE — Assessment & Plan Note (Addendum)
Here for hospital follow-up. Pain mildly controlled, but improving with physical therapy. Patient feels this benefits her greatly from a pain perspective. Recommended continued physical therapy. Ordered Lidocaine patches for patient. Discussed proper use x1 daily, and remove before 12hrs. Patient on home percocet prior to hospitalization for chronic pain. Discussed reducing dose to once daily prn, to which patient is agreeable. -Continue follow-up with Neurosurgery and PMR -Consider referral to pain medicine -Follow-up with me 1 month

## 2023-01-31 NOTE — Assessment & Plan Note (Signed)
On recheck, patient normotensive. Discussed with patient. Recommended checking BP at home and bring in log while holding current medications. Patient agreeable to plan.

## 2023-02-01 ENCOUNTER — Ambulatory Visit: Payer: Medicare HMO | Admitting: Occupational Therapy

## 2023-02-04 ENCOUNTER — Encounter: Payer: Self-pay | Admitting: Family Medicine

## 2023-02-05 ENCOUNTER — Emergency Department (HOSPITAL_COMMUNITY): Payer: Medicare HMO

## 2023-02-05 ENCOUNTER — Emergency Department (HOSPITAL_COMMUNITY)
Admission: EM | Admit: 2023-02-05 | Discharge: 2023-02-06 | Disposition: A | Discharge status: Home / Self Care | Payer: Medicare HMO | Attending: Emergency Medicine | Admitting: Emergency Medicine

## 2023-02-05 ENCOUNTER — Other Ambulatory Visit: Payer: Self-pay

## 2023-02-05 ENCOUNTER — Encounter (HOSPITAL_COMMUNITY): Payer: Self-pay

## 2023-02-05 DIAGNOSIS — Z20822 Contact with and (suspected) exposure to covid-19: Secondary | ICD-10-CM | POA: Diagnosis not present

## 2023-02-05 DIAGNOSIS — R600 Localized edema: Secondary | ICD-10-CM | POA: Insufficient documentation

## 2023-02-05 DIAGNOSIS — R0789 Other chest pain: Secondary | ICD-10-CM | POA: Diagnosis present

## 2023-02-05 DIAGNOSIS — I10 Essential (primary) hypertension: Secondary | ICD-10-CM | POA: Insufficient documentation

## 2023-02-05 DIAGNOSIS — Z7982 Long term (current) use of aspirin: Secondary | ICD-10-CM | POA: Diagnosis not present

## 2023-02-05 DIAGNOSIS — Z79899 Other long term (current) drug therapy: Secondary | ICD-10-CM | POA: Diagnosis not present

## 2023-02-05 DIAGNOSIS — R079 Chest pain, unspecified: Secondary | ICD-10-CM

## 2023-02-05 LAB — BASIC METABOLIC PANEL
Anion gap: 12 (ref 5–15)
BUN: 13 mg/dL (ref 8–23)
CO2: 22 mmol/L (ref 22–32)
Calcium: 9.1 mg/dL (ref 8.9–10.3)
Chloride: 104 mmol/L (ref 98–111)
Creatinine, Ser: 0.74 mg/dL (ref 0.44–1.00)
GFR, Estimated: >60 mL/min (ref >60)
Glucose, Bld: 163 mg/dL — ABNORMAL HIGH (ref 70–99)
Potassium: 4.1 mmol/L (ref 3.5–5.1)
Sodium: 138 mmol/L (ref 135–145)

## 2023-02-05 LAB — CBC
HCT: 34.4 % — ABNORMAL LOW (ref 36.0–46.0)
Hemoglobin: 10.6 g/dL — ABNORMAL LOW (ref 12.0–15.0)
MCH: 29.4 pg (ref 26.0–34.0)
MCHC: 30.8 g/dL (ref 30.0–36.0)
MCV: 95.6 fL (ref 80.0–100.0)
Platelets: 316 10*3/uL (ref 150–400)
RBC: 3.6 MIL/uL — ABNORMAL LOW (ref 3.87–5.11)
RDW: 14.5 % (ref 11.5–15.5)
WBC: 6.9 10*3/uL (ref 4.0–10.5)
nRBC: 0 % (ref 0.0–0.2)

## 2023-02-05 LAB — TROPONIN I (HIGH SENSITIVITY): Troponin I (High Sensitivity): 5 ng/L (ref <18)

## 2023-02-05 LAB — SARS CORONAVIRUS 2 BY RT PCR: SARS Coronavirus 2 by RT PCR: NEGATIVE

## 2023-02-05 LAB — CBG MONITORING, ED: Glucose-Capillary: 102 mg/dL — ABNORMAL HIGH (ref 70–99)

## 2023-02-05 MED ORDER — LABETALOL HCL 5 MG/ML IV SOLN
10.0000 mg | Freq: Once | INTRAVENOUS | Status: AC
  Administered 2023-02-06: 10 mg via INTRAVENOUS
  Filled 2023-02-05: qty 4

## 2023-02-05 NOTE — ED Provider Triage Note (Signed)
Emergency Medicine Provider Triage Evaluation Note  Robin [name] , a 67 y.o. female  was evaluated in triage.  Pt complains of chest pressure and discomfort.  Onset last night.  Blood pressure elevated.  Recently discontinued HCTZ due to falls.  No nausea or vomiting.  She is coughing up phlegm..  Review of Systems  Positive: Chest pain Negative: EVAR  Physical Exam  BP (!) 165/89 (BP Location: Right Arm)   Pulse 79   Temp 98.8 F (37.1 C) (Oral)   Resp 17   Ht 5\' 2"  (1.575 m)   Wt 58.1 kg   LMP 08/11/2011   SpO2 100%   BMI 23.41 kg/m  Gen:   Awake, no distress   Resp:  Normal effort  MSK:   Moves extremities without difficulty  Other:    Medical Decision Making  Medically screening exam initiated at 7:05 PM.  Appropriate orders placed.  Robin [name] was informed that the remainder of the evaluation will be completed by another provider, this initial triage assessment does not replace that evaluation, and the importance of remaining in the ED until their evaluation is complete.     Robin Captain, PA-C 02/05/23 1906

## 2023-02-05 NOTE — ED Triage Notes (Addendum)
Pt arrives with her daughter, pt reports her BP was 193/92 and 172/81 today associated with chest pain that started last night. She has not taken her hydrochlorothiazide since 7/16 due to fall from possible low blood pressure. She had spine surgery on 7/3.

## 2023-02-05 NOTE — ED Provider Notes (Signed)
[name] EMERGENCY DEPARTMENT AT [facility] Provider Note   CSN: [id number] Arrival date & time: 02/05/23  1802     History  Chief Complaint  Patient presents with   Hypertension   Chest Pain    Robin [name] is a 67 y.o. female.  Presents to the emergency department with concern over intermittent chest tightness and elevated blood pressure since yesterday.  Patient has a history of mild hypertension, previously on hydrochlorothiazide.  She was taken off of this because she was experiencing some low blood pressures.  Patient underwent spinal surgery on July 3.  Since then she has been having some high blood pressures and over the last 24 hours pressures have been as high as 190s systolic.  Has been having some swelling of her lower extremities.       Home Medications Prior to Admission medications   Medication Sig Start Date End Date Taking? Authorizing Provider  aspirin EC 81 MG tablet Take 1 tablet (81 mg total) by mouth daily. Swallow whole. 03/17/22   Celine Mans, MD  atorvastatin (LIPITOR) 80 MG tablet Take 1 tablet (80 mg total) by mouth daily. 08/29/22   Celine Mans, MD  cholecalciferol (VITAMIN D3) 25 MCG (1000 UNIT) tablet Take 1,000 Units by mouth daily.    [provider]  dexamethasone (DECADRON) 2 MG tablet Take 1 tablet (2 mg total) by mouth daily. 01/25/23   Setzer, Lynnell Jude, PA-C  DULoxetine (CYMBALTA) 20 MG capsule Take 1 capsule (20 mg total) by mouth at bedtime. 01/24/23   Setzer, Lynnell Jude, PA-C  empagliflozin (JARDIANCE) 10 MG TABS tablet Take 1 tablet (10 mg total) by mouth daily. 01/24/23   Setzer, Lynnell Jude, PA-C  fluticasone (FLONASE) 50 MCG/ACT nasal spray USE 2 SPRAYS IN EACH NOSTRIL EVERY DAY Patient taking differently: Place 2 sprays into both nostrils daily as needed for allergies or rhinitis. 12/07/22   Celine Mans, MD  gabapentin (NEURONTIN) 300 MG capsule Take 1 capsule (300 mg total) by mouth 3 (three) times  daily. 01/24/23   Setzer, Lynnell Jude, PA-C  hydrochlorothiazide (HYDRODIURIL) 25 MG tablet Take 1 tablet (25 mg total) by mouth daily. 01/31/23   Celine Mans, MD  lidocaine (LIDODERM) 5 % Place 1 patch onto the skin daily. Remove & Discard patch within 12 hours or as directed by MD 01/31/23   Celine Mans, MD  melatonin 5 MG TABS Take 1 tablet (5 mg total) by mouth at bedtime. 01/24/23   Setzer, Lynnell Jude, PA-C  metFORMIN (GLUCOPHAGE) 500 MG tablet Take 1 tablet (500 mg total) by mouth 2 (two) times daily with a meal. 01/24/23   Setzer, Lynnell Jude, PA-C  morphine (MS CONTIN) 15 MG 12 hr tablet Take 1 tablet (15 mg total) by mouth every 12 (twelve) hours. 01/24/23   Setzer, Lynnell Jude, PA-C  olmesartan (BENICAR) 20 MG tablet Take 1 tablet (20 mg total) by mouth at bedtime. 01/31/23   Celine Mans, MD  oxyCODONE-acetaminophen (PERCOCET/ROXICET) 5-325 MG tablet Take 1 tablet by mouth every 8 (eight) hours as needed for severe pain.    [provider]  polyethylene glycol (MIRALAX / GLYCOLAX) 17 g packet Take 17 g by mouth daily as needed for mild constipation. 01/24/23   Setzer, Lynnell Jude, PA-C  polyvinyl alcohol (LIQUIFILM TEARS) 1.4 % ophthalmic solution Place 1 drop into both eyes daily as needed (dry eyes). 01/24/23   Setzer, Lynnell Jude, PA-C  senna (SENOKOT) 8.6 MG TABS tablet Take 1 tablet (8.6 mg total)  by mouth 2 (two) times daily. 01/24/23   Setzer, Lynnell Jude, PA-C  vitamin B-12 (CYANOCOBALAMIN) 1000 MCG tablet Take 1,000 mcg by mouth daily.    [provider]  zinc sulfate 220 (50 Zn) MG capsule Take 220 mg by mouth daily.    [provider]      Allergies    Baclofen, Augmentin [amoxicillin-pot clavulanate], and Tape    Review of Systems   Review of Systems  Physical Exam Updated Vital Signs BP (!) 174/80   Pulse 70   Temp 98.7 F (37.1 C)   Resp 17   Ht 5\' 2"  (1.575 m)   Wt 58.1 kg   LMP 08/11/2011   SpO2 99%   BMI 23.41 kg/m  Physical Exam Vitals and  nursing note reviewed.  Constitutional:      General: She is not in acute distress.    Appearance: She is well-developed.  HENT:     Head: Normocephalic and atraumatic.     Mouth/Throat:     Mouth: Mucous membranes are moist.  Eyes:     General: Vision grossly intact. Gaze aligned appropriately.     Extraocular Movements: Extraocular movements intact.     Conjunctiva/sclera: Conjunctivae normal.  Cardiovascular:     Rate and Rhythm: Normal rate and regular rhythm.     Pulses: Normal pulses.     Heart sounds: Normal heart sounds, S1 normal and S2 normal. No murmur heard.    No friction rub. No gallop.  Pulmonary:     Effort: Pulmonary effort is normal. No respiratory distress.     Breath sounds: Normal breath sounds.  Abdominal:     General: Bowel sounds are normal.     Palpations: Abdomen is soft.     Tenderness: There is no abdominal tenderness. There is no guarding or rebound.     Hernia: No hernia is present.  Musculoskeletal:        General: No swelling.     Cervical back: Full passive range of motion without pain, normal range of motion and neck supple. No spinous process tenderness or muscular tenderness. Normal range of motion.     Right lower leg: No edema.     Left lower leg: No edema.  Skin:    General: Skin is warm and dry.     Capillary Refill: Capillary refill takes less than 2 seconds.     Findings: No ecchymosis, erythema, rash or wound.  Neurological:     General: No focal deficit present.     Mental Status: She is alert and oriented to person, place, and time.     GCS: GCS eye subscore is 4. GCS verbal subscore is 5. GCS motor subscore is 6.     Cranial Nerves: Cranial nerves 2-12 are intact.     Sensory: Sensation is intact.     Motor: Motor function is intact.     Coordination: Coordination is intact.  Psychiatric:        Attention and Perception: Attention normal.        Mood and Affect: Mood normal.        Speech: Speech normal.        Behavior:  Behavior normal.     ED Results / Procedures / Treatments   Labs (all labs ordered are listed, but only abnormal results are displayed) Labs Reviewed  BASIC METABOLIC PANEL - Abnormal; Notable for the following components:      Result Value   Glucose, Bld 163 (*)  All other components within normal limits  CBC - Abnormal; Notable for the following components:   RBC 3.60 (*)    Hemoglobin 10.6 (*)    HCT 34.4 (*)    All other components within normal limits  BRAIN NATRIURETIC PEPTIDE - Abnormal; Notable for the following components:   B Natriuretic Peptide 143.1 (*)    All other components within normal limits  CBG MONITORING, ED - Abnormal; Notable for the following components:   Glucose-Capillary 102 (*)    All other components within normal limits  SARS CORONAVIRUS 2 BY RT PCR  TROPONIN I (HIGH SENSITIVITY)  TROPONIN I (HIGH SENSITIVITY)    EKG EKG Interpretation Date/Time:  Sunday February 05 2023 19:00:37 EDT Ventricular Rate:  74 PR Interval:  166 QRS Duration:  78 QT Interval:  350 QTC Calculation: 388 R Axis:   36  Text Interpretation: Normal sinus rhythm Nonspecific ST and T wave abnormality Abnormal ECG When compared with ECG of 11-Jan-2023 08:12, no change Confirmed by Gilda Crease [phone]) on 02/05/2023 10:59:44 PM  Radiology CT Angio Chest Pulmonary Embolism (PE) W or WO Contrast  Result Date: 02/06/2023 CLINICAL DATA:  Pulmonary embolism suspected, high probability EXAM: CT ANGIOGRAPHY CHEST WITH CONTRAST TECHNIQUE: Multidetector CT imaging of the chest was performed using the standard protocol during bolus administration of intravenous contrast. Multiplanar CT image reconstructions and MIPs were obtained to evaluate the vascular anatomy. RADIATION DOSE REDUCTION: This exam was performed according to the departmental dose-optimization program which includes automated exposure control, adjustment of the mA and/or kV according to patient size and/or use of  iterative reconstruction technique. CONTRAST:  75mL OMNIPAQUE IOHEXOL 350 MG/ML SOLN COMPARISON:  Chest radiograph 02/05/2023 FINDINGS: Cardiovascular: Negative for acute pulmonary embolism. Normal caliber thoracic aorta. No pericardial effusion. Coronary artery and aortic atherosclerotic calcification. Mediastinum/Nodes: Trachea and esophagus are unremarkable. No thoracic adenopathy Lungs/Pleura: No focal consolidation, pleural effusion, or pneumothorax. Mild emphysema in the upper lungs. 9 mm ground-glass nodule with associated bronchiolectasis in the medial right lower lobe (6/115) Upper Abdomen: No acute abnormality. Musculoskeletal: No acute fracture. Review of the MIP images confirms the above findings. IMPRESSION: Negative for acute pulmonary embolism. No acute abnormality in the chest. 9 mm ground-glass nodule in the medial right lower lobe is likely due to scarring however follow-up is recommended to ensure stability. Consider one of the following in 3 months for both low-risk and high-risk individuals: (a) repeat chest CT, (b) follow-up PET-CT, or (c) tissue sampling. This recommendation follows the consensus statement: Guidelines for Management of Incidental Pulmonary Nodules Detected on CT Images: From the Fleischner Society 2017; Radiology 2017; 284:228-243. Aortic Atherosclerosis (ICD10-I70.0) and Emphysema (ICD10-J43.9). Electronically Signed   By: Minerva Fester M.D.   On: 02/06/2023 00:24   DG Chest 1 View  Result Date: 02/05/2023 CLINICAL DATA:  Chest pain EXAM: CHEST  1 VIEW COMPARISON:  X-ray 01/11/2023 FINDINGS: The heart size and mediastinal contours are within normal limits. Both lungs are clear. No consolidation, pneumothorax or effusion. No edema. Mild curvature of the spine. Calcified aorta. Fixation hardware along the lower cervical spine. IMPRESSION: No acute cardiopulmonary disease. Electronically Signed   By: Karen Kays M.D.   On: 02/05/2023 19:46    Procedures Procedures     Medications Ordered in ED Medications  amLODipine (NORVASC) tablet 5 mg (has no administration in time range)  labetalol (NORMODYNE) injection 10 mg (10 mg Intravenous Given 02/06/23 0101)  iohexol (OMNIPAQUE) 350 MG/ML injection 75 mL (75 mLs Intravenous Contrast Given  02/06/23 0006)    ED Course/ Medical Decision Making/ A&P                             Medical Decision Making Amount and/or Complexity of Data Reviewed External Data Reviewed: ECG and notes. Labs: ordered. Decision-making details documented in ED Course. Radiology: ordered and independent interpretation performed. Decision-making details documented in ED Course. ECG/medicine tests: ordered and independent interpretation performed. Decision-making details documented in ED Course.  Risk Prescription drug management.   Differential Diagnosis considered includes, but not limited to: STEMI; NSTEMI; myocarditis; pericarditis; pulmonary embolism; aortic dissection; pneumothorax; pneumonia; gastritis; musculoskeletal pain  Presents to the emergency department for evaluation of elevated blood pressure.  Patient does have a history of hypertension, previously on hydrochlorothiazide.  This was stopped at some point in the recent past because of low blood pressures.  Blood pressures now have been persistently very elevated.  Cardiac evaluation has been reassuring.  No EKG changes.  Troponin negative x 2.  Symptoms do not seem to be consistent with an acute coronary syndrome at this time.  She reports an intermittent pain that does not seem to be brought on by anything.  Patient did recently have surgery.  PE was therefore felt to be in the differential diagnosis.  Patient underwent CT angiography to further evaluate.  No evidence of PE.  Additionally, patient has had cough, productive of mostly clear sputum.  No evidence of pneumonia on x-ray or CT.  Blood pressure was very elevated here in the department.  She was given  labetalol with some improvement.  I suspect she will require more than hydrochlorothiazide.  Given Norvasc here with further improvement.  Will be discharged with a prescription.  She can follow-up with her primary care doctor for repeat blood pressure checks.        Final Clinical Impression(s) / ED Diagnoses Final diagnoses:  Nonspecific chest pain  Primary hypertension    Rx / DC Orders ED Discharge Orders     None         Aftan Vint, Canary Brim, MD 02/06/23 0230

## 2023-02-06 ENCOUNTER — Emergency Department (HOSPITAL_COMMUNITY): Payer: Medicare HMO

## 2023-02-06 MED ORDER — AMLODIPINE BESYLATE 5 MG PO TABS
5.0000 mg | ORAL_TABLET | Freq: Once | ORAL | Status: AC
  Administered 2023-02-06: 5 mg via ORAL
  Filled 2023-02-06: qty 1

## 2023-02-06 MED ORDER — AMLODIPINE BESYLATE 5 MG PO TABS: 5.0000 mg | ORAL_TABLET | Freq: Every day | ORAL | 3 refills | Status: DC

## 2023-02-06 MED ORDER — LABETALOL HCL 5 MG/ML IV SOLN
20.0000 mg | Freq: Once | INTRAVENOUS | Status: AC
  Administered 2023-02-06: 20 mg via INTRAVENOUS
  Filled 2023-02-06: qty 4

## 2023-02-06 MED ORDER — IOHEXOL 350 MG/ML SOLN
75.0000 mL | Freq: Once | INTRAVENOUS | Status: AC | PRN
  Administered 2023-02-06: 75 mL via INTRAVENOUS

## 2023-02-07 ENCOUNTER — Telehealth: Payer: Self-pay | Admitting: Family Medicine

## 2023-02-07 NOTE — Telephone Encounter (Signed)
Called patient to schedule TOPC appointment next Thursday August 8th. Patient agreeable to appointment. Will schedule.

## 2023-02-09 ENCOUNTER — Telehealth: Payer: Self-pay

## 2023-02-09 NOTE — Telephone Encounter (Signed)
Pharmacy Patient Advocate Encounter   Received notification from CoverMyMeds that prior authorization for LIDOCAINE PATCHES is required/requested.    PA required; PA submitted to [facility] via CoverMyMeds Key/confirmation #/EOC [id number]. Status is pending

## 2023-02-10 NOTE — Telephone Encounter (Signed)
Pharmacy Patient Advocate Encounter  Received notification from [facility] that Prior Authorization for LIDOCAINE 5% PATCHES has been  APPROVED 02/09/23 TO 07/11/23  PA #/Case ID/Reference #: [id number]

## 2023-02-13 ENCOUNTER — Ambulatory Visit: Payer: Medicare HMO | Admitting: Occupational Therapy

## 2023-02-13 ENCOUNTER — Other Ambulatory Visit: Payer: Self-pay | Admitting: Family Medicine

## 2023-02-13 ENCOUNTER — Encounter: Payer: Self-pay | Admitting: Occupational Therapy

## 2023-02-13 ENCOUNTER — Ambulatory Visit: Payer: Medicare HMO | Attending: Physician Assistant | Admitting: Physical Therapy

## 2023-02-13 VITALS — BP 158/82 | HR 70

## 2023-02-13 DIAGNOSIS — R2689 Other abnormalities of gait and mobility: Secondary | ICD-10-CM | POA: Insufficient documentation

## 2023-02-13 DIAGNOSIS — R278 Other lack of coordination: Secondary | ICD-10-CM | POA: Diagnosis present

## 2023-02-13 DIAGNOSIS — R293 Abnormal posture: Secondary | ICD-10-CM | POA: Diagnosis present

## 2023-02-13 DIAGNOSIS — R2681 Unsteadiness on feet: Secondary | ICD-10-CM

## 2023-02-13 DIAGNOSIS — M542 Cervicalgia: Secondary | ICD-10-CM | POA: Insufficient documentation

## 2023-02-13 DIAGNOSIS — R208 Other disturbances of skin sensation: Secondary | ICD-10-CM | POA: Insufficient documentation

## 2023-02-13 DIAGNOSIS — M25512 Pain in left shoulder: Secondary | ICD-10-CM | POA: Insufficient documentation

## 2023-02-13 DIAGNOSIS — M25511 Pain in right shoulder: Secondary | ICD-10-CM

## 2023-02-13 DIAGNOSIS — M6281 Muscle weakness (generalized): Secondary | ICD-10-CM | POA: Insufficient documentation

## 2023-02-13 DIAGNOSIS — G959 Disease of spinal cord, unspecified: Secondary | ICD-10-CM | POA: Insufficient documentation
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
## 2023-02-14 ENCOUNTER — Other Ambulatory Visit: Payer: Self-pay

## 2023-02-14 DIAGNOSIS — G894 Chronic pain syndrome: Secondary | ICD-10-CM

## 2023-02-14 MED ORDER — OXYCODONE-ACETAMINOPHEN 5-325 MG PO TABS: 1.0000 | ORAL_TABLET | Freq: Two times a day (BID) | ORAL | 0 refills | Status: DC | PRN

## 2023-02-16 ENCOUNTER — Ambulatory Visit: Payer: Medicare HMO | Admitting: Family Medicine

## 2023-02-16 ENCOUNTER — Encounter: Payer: Self-pay | Admitting: Family Medicine

## 2023-02-16 VITALS — BP 156/80 | HR 85 | Wt 125.0 lb

## 2023-02-16 DIAGNOSIS — G9520 Unspecified cord compression: Secondary | ICD-10-CM | POA: Diagnosis not present

## 2023-02-16 DIAGNOSIS — E782 Mixed hyperlipidemia: Secondary | ICD-10-CM

## 2023-02-16 DIAGNOSIS — G894 Chronic pain syndrome: Secondary | ICD-10-CM | POA: Diagnosis not present

## 2023-02-16 DIAGNOSIS — I10 Essential (primary) hypertension: Secondary | ICD-10-CM | POA: Diagnosis not present

## 2023-02-16 DIAGNOSIS — Z720 Tobacco use: Secondary | ICD-10-CM

## 2023-02-16 DIAGNOSIS — E042 Nontoxic multinodular goiter: Secondary | ICD-10-CM

## 2023-02-16 MED ORDER — DULOXETINE HCL 20 MG PO CPEP
20.0000 mg | ORAL_CAPSULE | Freq: Every day | ORAL | 0 refills | Status: DC
Start: 2023-02-16 — End: 2023-02-22

## 2023-02-16 MED ORDER — MELATONIN 5 MG PO TABS: 5.0000 mg | ORAL_TABLET | Freq: Every evening | ORAL | 0 refills | Status: AC | PRN

## 2023-02-16 MED ORDER — LIDOCAINE 5 % EX PTCH
1.0000 | MEDICATED_PATCH | CUTANEOUS | 0 refills | Status: DC
Start: 2023-02-16 — End: 2023-02-22

## 2023-02-16 NOTE — Assessment & Plan Note (Signed)
Patient open to discuss options for quitting.  Will have more in-depth discussion at follow-up in 2 weeks.  Had CTA during hospitalization that has pulmonary nodule with need for imaging follow-up in 3 months.

## 2023-02-16 NOTE — Assessment & Plan Note (Signed)
Bilateral upper extremity strength improved since last visit.  Per patient report doing well after surgery.  Continue follow-up as directed with neurosurgeon.  Continue duloxetine and gabapentin started during rehab hospitalization.  Has follow-up with Dr. Berline Chough next week.

## 2023-02-16 NOTE — Progress Notes (Signed)
SUBJECTIVE:   CHIEF COMPLAINT / HPI: [facility]  Accompanied by daughter who is LPN and helps manage her medications.  BP - BP really elevated to home before going to ED. was started on amlodipine by ED provider.  Previously discussed stopping hydrochlorothiazide at a clinic visit as her blood pressure was controlled and borderline low, the setting of her recent hospitalization due to fall of unknown cause.  Blood pressure now at home has been ranging 140s to 50s over 80s.  Pain - x2 Oxycodone per day is helping do functional activities around the house.  Enjoys crafting which she has been unable to do still due to her weakness after cervical neck surgery.  Reports seeing her surgeon Dr. Franky Macho who says she is doing well.  Does still have numbness, tingling and burning pain that radiates down both of her arms.  She is unsure if gabapentin and duloxetine have been helping.  Thyroid - Has endocrinology appointment scheduled in September.  Smoking - 5-6 cigarettes per day.  Would like to quit.  This is decreased from what she used to smoke.  Has intermittently quit for up to 18 months.  Previously on Chantix.  Patient taking multiple vitamins including vitamin D, vitamin C, zinc, vitamin B12.  States she was not directed to take these by a doctor but does it for her health.  PERTINENT  PMH / PSH: Cervical myelopathy, Thyroid Nodules, Chronic pain syndrome, HTN, T2DM  OBJECTIVE:   BP (!) 156/80   Pulse 85   Wt 125 lb (56.7 kg)   LMP 08/11/2011   SpO2 98%   BMI 22.86 kg/m   General: NAD  Neuro: A&O, upper extremity strength mildly decreased in right side comparison to left side, no tenderness to palpation along cervical spinous processes, neck ROM full Cardiovascular: RRR, no murmurs, no peripheral edema Respiratory: normal WOB on RA, CTAB, no wheezes, ronchi or rales Extremities: Moving all 4 extremities equally   ASSESSMENT/PLAN:   HYPERLIPIDEMIA Assessment & Plan: Continue  atorvastatin 80 mg daily.  Orders: -     Lipid panel  Essential hypertension, benign Assessment & Plan: Uncontrolled, recently worsened after attempt at de-escalating antihypertensive.  Restart patient's previous home hydrochlorothiazide 25 mg daily, stop amlodipine 5 mg daily, continue olmesartan 20 mg daily.  Follow-up 2 weeks, given instructions to record bring in blood pressure diary and bring in blood pressure cuff.   Chronic pain syndrome Assessment & Plan: Patient continues to meet functional needs with the use of oxycodone 2 times twice daily.  Continue bowel movement prophylaxis with senna MiraLAX.  Discussed long-term goals of eventually decreasing to once daily as needed.  At this time given recent traumatic cervical injury and surgery continue oxycodone 2 times daily.   Cervical cord compression with myelopathy (HCC) Assessment & Plan: Bilateral upper extremity strength improved since last visit.  Per patient report doing well after surgery.  Continue follow-up as directed with neurosurgeon.  Continue duloxetine and gabapentin started during rehab hospitalization.  Has follow-up with Dr. Berline Chough next week.  Orders: -     DULoxetine HCl; Take 1 capsule (20 mg total) by mouth at bedtime for 7 days.  Dispense: 7 capsule; Refill: 0 -     Vitamin B12 -     Lidocaine; Place 1 patch onto the skin daily. Remove & Discard patch within 12 hours or as directed by MD  Dispense: 30 patch; Refill: 0  Tobacco abuse Assessment & Plan: Patient open to discuss options for  quitting.  Will have more in-depth discussion at follow-up in 2 weeks.  Had CTA during hospitalization that has pulmonary nodule with need for imaging follow-up in 3 months.   Multiple thyroid nodules Assessment & Plan: Endocrinology follow-up scheduled for September 11.  Discussed with patient to go to that appointment.  Orders: -     VITAMIN D 25 Hydroxy (Vit-D Deficiency, Fractures)  Other orders -     Melatonin;  Take 1 tablet (5 mg total) by mouth at bedtime as needed.  Dispense: 30 tablet; Refill: 0   Return in about 2 weeks (around 03/02/2023) for BP f/u.  Celine Mans, MD [facility]

## 2023-02-16 NOTE — Assessment & Plan Note (Signed)
Endocrinology follow-up scheduled for September 11.  Discussed with patient to go to that appointment.

## 2023-02-16 NOTE — Assessment & Plan Note (Signed)
Patient continues to meet functional needs with the use of oxycodone 2 times twice daily.  Continue bowel movement prophylaxis with senna MiraLAX.  Discussed long-term goals of eventually decreasing to once daily as needed.  At this time given recent traumatic cervical injury and surgery continue oxycodone 2 times daily.

## 2023-02-16 NOTE — Assessment & Plan Note (Signed)
Continue atorvastatin 80 mg daily. 

## 2023-02-16 NOTE — Assessment & Plan Note (Signed)
Uncontrolled, recently worsened after attempt at de-escalating antihypertensive.  Restart patient's previous home hydrochlorothiazide 25 mg daily, stop amlodipine 5 mg daily, continue olmesartan 20 mg daily.  Follow-up 2 weeks, given instructions to record bring in blood pressure diary and bring in blood pressure cuff.

## 2023-02-16 NOTE — Patient Instructions (Addendum)
It was great to see you! Thank you for allowing me to participate in your care!  Our plans for today:  - Please measure you blood pressure over the next 2 weeks and bring in for review over the next 2 weeks. Please continue taking Olmesartan and Hydrochlorothiazide. STOP taking Amlodipine. - Please make sure to reevaluate your pain regimen at your visit with your rehab doctor next week.  - We are check some labs today, I will let you know if any of your results are abnormal. - You may stop taking Vitamin D and Vitamin C if you would like. - You may use Melatonin as needed.    Please arrive 15 minutes PRIOR to your next scheduled appointment time! If you do not, this affects OTHER patients' care.  Take care and seek immediate care sooner if you develop any concerns.   Celine Mans, MD, PGY-2 [facility] 9:48 AM 02/16/2023  [facility]

## 2023-02-22 ENCOUNTER — Encounter: Payer: Medicare HMO | Attending: Physical Medicine and Rehabilitation | Admitting: Physical Medicine and Rehabilitation

## 2023-02-22 ENCOUNTER — Encounter: Payer: Self-pay | Admitting: Physical Medicine and Rehabilitation

## 2023-02-22 VITALS — BP 148/82 | HR 75 | Ht 62.0 in | Wt 123.4 lb

## 2023-02-22 DIAGNOSIS — G894 Chronic pain syndrome: Secondary | ICD-10-CM | POA: Diagnosis present

## 2023-02-22 DIAGNOSIS — G9520 Unspecified cord compression: Secondary | ICD-10-CM | POA: Diagnosis present

## 2023-02-22 DIAGNOSIS — Z79891 Long term (current) use of opiate analgesic: Secondary | ICD-10-CM | POA: Insufficient documentation

## 2023-02-22 DIAGNOSIS — M7918 Myalgia, other site: Secondary | ICD-10-CM | POA: Insufficient documentation

## 2023-02-22 DIAGNOSIS — Z5181 Encounter for therapeutic drug level monitoring: Secondary | ICD-10-CM | POA: Diagnosis not present

## 2023-02-22 DIAGNOSIS — G8250 Quadriplegia, unspecified: Secondary | ICD-10-CM | POA: Insufficient documentation

## 2023-02-22 MED ORDER — LIDOCAINE 5 % EX PTCH
1.0000 | MEDICATED_PATCH | CUTANEOUS | 1 refills | Status: AC
Start: 2023-02-22 — End: ?

## 2023-02-22 MED ORDER — TIZANIDINE HCL 4 MG PO TABS: 2.0000 mg | ORAL_TABLET | Freq: Two times a day (BID) | ORAL | 1 refills | Status: DC | PRN

## 2023-02-22 MED ORDER — GABAPENTIN 300 MG PO CAPS: 300.0000 mg | ORAL_CAPSULE | Freq: Three times a day (TID) | ORAL | 5 refills | Status: DC

## 2023-02-22 MED ORDER — DULOXETINE HCL 20 MG PO CPEP
40.0000 mg | ORAL_CAPSULE | Freq: Every day | ORAL | 1 refills | Status: AC
Start: 2023-02-22 — End: ?
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
## 2023-02-22 NOTE — Patient Instructions (Signed)
Pt is a 67 yr old female with hx of Traumatic incomplete ASIA D quadriplegia  (surgery late June 2024) due to ground level fall and severe cervical stenosis- with associated nerve and post fall pain; also has spasticity- and also has DM and HTN Here for hospital f/u for SCI   Pain uncontrolled can make BP uncontrolled.    2. Will get back for trigger point injections- worried about injections- but will get on wait list.     3. I don't think spasticity is an issue right now- I think muscle pain and nerve pain the main problem- which is easier ot treat   4. Increase Duloxetine to 40 mg nightly for nerve pain- to help burning- can increase to 60 mg if need be in future?  5. Con't Gabapentin for nerve pain- 300 mg 3x/day-    6. Zanaflex/tizanidine- 2 mg 2x/day as needed for muscle spasms/neck/shoulder pain- cna increase to 4 mg 2x/day as needed day 3-7 if required.   7. Renew Percocet 5/325 mg- once UDS is back. Will refill percocet 5/325 mg up to 3x/day as needed #90   8. Opiate contract- and urine drug screen today.    9.  Con't Lidoderm 3 patches 12 hours on; 12 hours off- #270 with 1 refill- sent to [facility] mail order  10. F/U in 3 months double visit- SCI- and wait list for trigger point injections.

## 2023-02-24 ENCOUNTER — Other Ambulatory Visit: Payer: Self-pay

## 2023-02-24 ENCOUNTER — Other Ambulatory Visit: Payer: Self-pay | Admitting: Physical Medicine and Rehabilitation

## 2023-02-24 NOTE — Telephone Encounter (Signed)
Robin [name] (Key: [id number]) - [id number] Gabapentin 300MG  capsules

## 2023-02-26 LAB — TOXASSURE SELECT,+ANTIDEPR,UR

## 2023-02-27 ENCOUNTER — Telehealth: Payer: Self-pay | Admitting: *Deleted

## 2023-02-27 MED ORDER — METFORMIN HCL 500 MG PO TABS: 500.0000 mg | ORAL_TABLET | Freq: Two times a day (BID) | ORAL | 3 refills | Status: DC

## 2023-02-27 NOTE — Telephone Encounter (Signed)
  Robin [name] (Key: [id number]) - [id number] Gabapentin 300MG  capsules Status: PA Response - Approved

## 2023-02-28 ENCOUNTER — Telehealth: Payer: Self-pay | Admitting: *Deleted

## 2023-02-28 NOTE — Progress Notes (Signed)
    SUBJECTIVE:   CHIEF COMPLAINT / HPI: BP, pain management  Restarted hydrochlorothiazide 25mg  and continued Olmesartan 20mg  2 weeks ago with me.  No hypotension at home.  Brought in blood pressure log systolically 140s.  Diastolic in the 70s.  Reviewed note from Dr. Berline Chough. Opioid management now through Dr. Berline Chough.  Pain is well-controlled on current regimen.  Discussed for further refills regarding pain medicines please reach out to her  PERTINENT  PMH / PSH: PAD, HTN, T2DM, Cervical cord compression.  OBJECTIVE:   BP 127/67   Pulse 67   Ht 5\' 2"  (1.575 m)   Wt 123 lb 4 oz (55.9 kg)   LMP 08/11/2011   SpO2 100%   BMI 22.54 kg/m   General: NAD, well appearing Neuro: A&O Respiratory: normal WOB on RA Extremities: Moving all 4 extremities equally    ASSESSMENT/PLAN:   Essential hypertension, benign Assessment & Plan: Blood pressure mildly elevated at home 140s systolically.  Patient's home blood pressure cuff possibly possibly inaccurate, however today on recheck in comparison to clinic machine relatively similar.  Will continue olmesartan and HCTZ at current doses for now.  Continue to monitor, if persistently systolically 140s at home will add amlodipine at next visit.  Discussed verifying with either new blood pressure cuff machine or stationary machine at [facility] or [facility].  Orders: -     Basic metabolic panel  Type 2 diabetes mellitus with hyperglycemia, without long-term current use of insulin (HCC) -     Empagliflozin; Take 1 tablet (10 mg total) by mouth daily.  Dispense: 30 tablet; Refill: 0  Controlled type 2 diabetes mellitus with complication, without long-term current use of insulin (HCC) Assessment & Plan: Patient requesting refill of Jardiance.   Other orders -     Senna; Take 1 tablet (8.6 mg total) by mouth 2 (two) times daily.  Dispense: 60 tablet; Refill: 0   Return in about 1 month (around 04/01/2023) for BP f/u.  Celine Mans, MD [facility]  [facility]

## 2023-02-28 NOTE — Telephone Encounter (Signed)
Urine drug screen is positive for prescribed medication oxycodone but is also positive for alcohol and THC (low level). A formal warning letter will be sent through [employer] and [employer].

## 2023-03-01 ENCOUNTER — Encounter: Payer: Self-pay | Admitting: Family Medicine

## 2023-03-01 ENCOUNTER — Ambulatory Visit (INDEPENDENT_AMBULATORY_CARE_PROVIDER_SITE_OTHER): Payer: Medicare HMO | Admitting: Family Medicine

## 2023-03-01 VITALS — BP 127/67 | HR 67 | Ht 62.0 in | Wt 123.2 lb

## 2023-03-01 DIAGNOSIS — E1165 Type 2 diabetes mellitus with hyperglycemia: Secondary | ICD-10-CM | POA: Diagnosis not present

## 2023-03-01 DIAGNOSIS — I10 Essential (primary) hypertension: Secondary | ICD-10-CM | POA: Diagnosis not present

## 2023-03-01 DIAGNOSIS — Z7984 Long term (current) use of oral hypoglycemic drugs: Secondary | ICD-10-CM

## 2023-03-01 DIAGNOSIS — E118 Type 2 diabetes mellitus with unspecified complications: Secondary | ICD-10-CM

## 2023-03-01 MED ORDER — SENNA 8.6 MG PO TABS: 1.0000 | ORAL_TABLET | Freq: Two times a day (BID) | ORAL | 0 refills | Status: DC

## 2023-03-01 MED ORDER — EMPAGLIFLOZIN 10 MG PO TABS
10.0000 mg | ORAL_TABLET | Freq: Every day | ORAL | 0 refills | Status: AC
Start: 2023-03-01 — End: ?

## 2023-03-01 NOTE — Assessment & Plan Note (Signed)
Blood pressure mildly elevated at home 140s systolically.  Patient's home blood pressure cuff possibly possibly inaccurate, however today on recheck in comparison to clinic machine relatively similar.  Will continue olmesartan and HCTZ at current doses for now.  Continue to monitor, if persistently systolically 140s at home will add amlodipine at next visit.  Discussed verifying with either new blood pressure cuff machine or stationary machine at [facility] or [facility].

## 2023-03-01 NOTE — Assessment & Plan Note (Signed)
Patient requesting refill of Jardiance.

## 2023-03-01 NOTE — Patient Instructions (Signed)
It was great to see you! Thank you for allowing me to participate in your care!  Our plans for today:  - Please continue taking your hydrochlorothiazide and olmesartan, and monitoring you blood pressure.   Please arrive 15 minutes PRIOR to your next scheduled appointment time! If you do not, this affects OTHER patients' care.  Take care and seek immediate care sooner if you develop any concerns.   Celine Mans, MD, PGY-2 [facility] 11:59 AM 03/01/2023  [facility]

## 2023-03-02 ENCOUNTER — Other Ambulatory Visit: Payer: Self-pay

## 2023-03-02 DIAGNOSIS — G894 Chronic pain syndrome: Secondary | ICD-10-CM

## 2023-03-02 LAB — BASIC METABOLIC PANEL
BUN/Creatinine Ratio: 32 — ABNORMAL HIGH (ref 12–28)
BUN: 33 mg/dL — ABNORMAL HIGH (ref 8–27)
CO2: 23 mmol/L (ref 20–29)
Calcium: 9.8 mg/dL (ref 8.7–10.3)
Chloride: 103 mmol/L (ref 96–106)
Creatinine, Ser: 1.02 mg/dL — ABNORMAL HIGH (ref 0.57–1.00)
Glucose: 191 mg/dL — ABNORMAL HIGH (ref 70–99)
Potassium: 4.7 mmol/L (ref 3.5–5.2)
Sodium: 140 mmol/L (ref 134–144)
eGFR: 61 mL/min/{1.73_m2} (ref >59)

## 2023-03-02 MED ORDER — OXYCODONE-ACETAMINOPHEN 5-325 MG PO TABS
1.0000 | ORAL_TABLET | Freq: Two times a day (BID) | ORAL | 0 refills | Status: AC | PRN
Start: 2023-03-02 — End: 2023-04-01

## 2023-03-02 NOTE — Telephone Encounter (Signed)
Pt needs refill. Next appt is in Nov. 2024

## 2023-03-08 ENCOUNTER — Ambulatory Visit: Payer: Medicare HMO | Admitting: Physical Therapy

## 2023-03-08 ENCOUNTER — Encounter: Payer: Self-pay | Admitting: Occupational Therapy

## 2023-03-08 ENCOUNTER — Ambulatory Visit: Payer: Medicare HMO | Admitting: Occupational Therapy

## 2023-03-08 DIAGNOSIS — R2681 Unsteadiness on feet: Secondary | ICD-10-CM

## 2023-03-08 DIAGNOSIS — M542 Cervicalgia: Secondary | ICD-10-CM | POA: Diagnosis not present

## 2023-03-08 DIAGNOSIS — M6281 Muscle weakness (generalized): Secondary | ICD-10-CM

## 2023-03-08 DIAGNOSIS — M25512 Pain in left shoulder: Secondary | ICD-10-CM

## 2023-03-08 DIAGNOSIS — M25511 Pain in right shoulder: Secondary | ICD-10-CM

## 2023-03-08 DIAGNOSIS — R278 Other lack of coordination: Secondary | ICD-10-CM
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
## 2023-03-08 NOTE — Patient Instructions (Signed)
1. Grip Strengthening (Resistive Putty)   Squeeze putty using thumb and all fingers. Repeat _20___ times. Do __2__ sessions per day.   2. Roll putty into tube on table and pinch between first two fingers and thumb x 10 reps. Do 2 sessions per day Do NOT hyperextend thumb   Coordination Activities  Perform the following activities for 10 minutes 1-2 times per day with right hand(s).  Rotate ball in fingertips (clockwise and counter-clockwise). Flip cards 1 at a time as fast as you can. Deal cards with your thumb (Hold deck in hand and push card off top with thumb). Rotate one card in hand (clockwise and counter-clockwise). Pick up coins one at a time until you get 5 in your hand, then move coins from palm to fingertips to stack one at a time. Practice writing and/or typing.     Copyright  VHI. All rights reserved.

## 2023-03-17 ENCOUNTER — Ambulatory Visit: Payer: Medicare HMO | Admitting: Occupational Therapy

## 2023-03-17 ENCOUNTER — Ambulatory Visit: Payer: Medicare HMO | Attending: Physician Assistant | Admitting: Physical Therapy

## 2023-03-17 DIAGNOSIS — M25511 Pain in right shoulder: Secondary | ICD-10-CM | POA: Insufficient documentation

## 2023-03-17 DIAGNOSIS — R278 Other lack of coordination: Secondary | ICD-10-CM | POA: Diagnosis present

## 2023-03-17 DIAGNOSIS — M25512 Pain in left shoulder: Secondary | ICD-10-CM

## 2023-03-17 DIAGNOSIS — M542 Cervicalgia: Secondary | ICD-10-CM

## 2023-03-17 DIAGNOSIS — G959 Disease of spinal cord, unspecified: Secondary | ICD-10-CM

## 2023-03-17 DIAGNOSIS — R208 Other disturbances of skin sensation: Secondary | ICD-10-CM

## 2023-03-17 DIAGNOSIS — R293 Abnormal posture: Secondary | ICD-10-CM | POA: Diagnosis present

## 2023-03-17 DIAGNOSIS — M6281 Muscle weakness (generalized): Secondary | ICD-10-CM | POA: Insufficient documentation

## 2023-03-17 DIAGNOSIS — R2681 Unsteadiness on feet: Secondary | ICD-10-CM | POA: Diagnosis present

## 2023-03-17 NOTE — Patient Instructions (Addendum)
Upper Body Strengthening Exercises  Comments  Sit upright, away from the back of your chair. Keep abdominal muscles engaged i.e. pull belly button to spine.  FOR ALL EXERCISES: Repeat 10 Times  Hold 3 Seconds  Complete 1 Set  Perform 2 Times a Day              ELASTIC BAND TRICEPS EXTENSION  While seated, hold and fixate one end of an elastic band against your chest. Hold the other end with your opposite hand with your elbow bent and arm by your side.   Start by pulling the band downward so that the elbow goes from a bent position to a straightened position as shown. Return to starting position and repeat.   BICEPS CURL WITH BAND  While sitting in an upright position, put an elastic band underneath your feet (as pictured)  OR underneath your thighs. Grip each end of the band, keep the elbows tucked to the body, and bring hands to shoulders by bending at the elbows.    Heat Home Program  Heat is used as part of your therapy for several reasons.  Heat increases blood flow, which promotes healing. Heat also relaxes muscles and joints which makes exercising easier.  It can be applied for __10-15_ minutes,  _up to 3_ sessions per day  Use one of the following methods that is most convenient for you  Heating pad: Follow instructions given by manufacturer.  Use on low-medium setting.    Moist heated towel: Soak towel in hot water or place damp towel in microwave and heat for 30 sec. Check temperature to determine if further time needed.  Wring out towel and wrap around affected area, cover with a plastic bag.  Can also wrap a dry towel around the plastic to help retain the heat.    Microwave gel pack: Follow instructions given by manufacturer.  Check temperature before application to ensure not hot enough to cause a burn    Rice sock: This can be made using a sock with no synthetic material and 1.5 cups of rice.  Pour the rice into the sock, tie a knot at the top.  Place in  microwave for 1 minute, remove to check temperature to determine if further heating time is required prior to application  Be sure to check the skin periodically to ensure no excessive redness or blisters.  Use with caution in areas with decreased sensation
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
## 2023-03-20 ENCOUNTER — Ambulatory Visit: Payer: Medicare HMO | Admitting: Occupational Therapy

## 2023-03-20 ENCOUNTER — Ambulatory Visit: Payer: Medicare HMO | Admitting: Physical Therapy

## 2023-03-22 ENCOUNTER — Encounter: Payer: Self-pay | Admitting: Internal Medicine

## 2023-03-22 ENCOUNTER — Ambulatory Visit: Payer: Medicare HMO | Admitting: Internal Medicine

## 2023-03-22 VITALS — BP 120/80 | HR 79 | Ht 62.0 in | Wt 125.0 lb

## 2023-03-22 DIAGNOSIS — E042 Nontoxic multinodular goiter: Secondary | ICD-10-CM

## 2023-03-22 DIAGNOSIS — R490 Dysphonia: Secondary | ICD-10-CM

## 2023-03-22 DIAGNOSIS — E0590 Thyrotoxicosis, unspecified without thyrotoxic crisis or storm: Secondary | ICD-10-CM | POA: Insufficient documentation

## 2023-03-22 LAB — T3, FREE: T3, Free: 3.6 pg/mL (ref 2.3–4.2)

## 2023-03-22 LAB — T4, FREE: Free T4: 1.14 ng/dL (ref 0.60–1.60)

## 2023-03-22 LAB — TSH: TSH: 0.4 u[IU]/mL (ref 0.35–5.50)
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
## 2023-03-25 ENCOUNTER — Other Ambulatory Visit: Payer: Self-pay | Admitting: Family Medicine

## 2023-03-26 LAB — TRAB (TSH RECEPTOR BINDING ANTIBODY): TRAB: <1 IU/L (ref <=2.00)

## 2023-03-27 ENCOUNTER — Ambulatory Visit: Payer: Medicare HMO | Admitting: Occupational Therapy

## 2023-03-27 ENCOUNTER — Ambulatory Visit: Payer: Medicare HMO | Admitting: Physical Therapy

## 2023-03-29 ENCOUNTER — Other Ambulatory Visit: Payer: Self-pay | Admitting: Family Medicine

## 2023-03-29 DIAGNOSIS — E1165 Type 2 diabetes mellitus with hyperglycemia: Secondary | ICD-10-CM

## 2023-04-03 ENCOUNTER — Encounter: Payer: Self-pay | Admitting: Occupational Therapy

## 2023-04-03 ENCOUNTER — Ambulatory Visit: Payer: Medicare HMO | Admitting: Physical Therapy

## 2023-04-03 ENCOUNTER — Ambulatory Visit: Payer: Medicare HMO | Admitting: Occupational Therapy

## 2023-04-03 DIAGNOSIS — G959 Disease of spinal cord, unspecified: Secondary | ICD-10-CM

## 2023-04-03 DIAGNOSIS — M6281 Muscle weakness (generalized): Secondary | ICD-10-CM

## 2023-04-03 DIAGNOSIS — R293 Abnormal posture: Secondary | ICD-10-CM

## 2023-04-03 DIAGNOSIS — M25512 Pain in left shoulder: Secondary | ICD-10-CM

## 2023-04-03 DIAGNOSIS — R278 Other lack of coordination: Secondary | ICD-10-CM | POA: Diagnosis not present

## 2023-04-03 DIAGNOSIS — R2681 Unsteadiness on feet: Secondary | ICD-10-CM

## 2023-04-03 DIAGNOSIS — M25511 Pain in right shoulder: Secondary | ICD-10-CM

## 2023-04-03 DIAGNOSIS — M542 Cervicalgia: Secondary | ICD-10-CM
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
## 2023-04-03 NOTE — Patient Instructions (Signed)
  Standing over table with feet apart and elbows straight - rock slowly forward over hands, then back x 10 reps.  Then come forward (hips towards table), elbows straight - move slowly side to side x 10 reps. 1-2x/day  Move feet further away from table, and rock back to get stretch in shoulders, then come forward and do a "mini push up" x 5 reps. 1-2x/day   Scapular Retraction: Bilateral    Facing anchor, pull arms back, bringing shoulder blades together. Do NOT hike shoulders Repeat __10__ times per set.  Do _1___ sessions per day.   Strengthening: Resisted Extension   Hold tubing in __BOTH___ hand(s), arm forward. Pull arm back, elbow straight. Repeat _10___ times per set. Do _1___ sessions per day.

## 2023-04-10 ENCOUNTER — Ambulatory Visit: Payer: Medicare HMO | Admitting: Occupational Therapy

## 2023-04-10 ENCOUNTER — Ambulatory Visit: Payer: Medicare HMO | Admitting: Physical Therapy

## 2023-04-10 ENCOUNTER — Other Ambulatory Visit: Payer: Self-pay

## 2023-04-10 DIAGNOSIS — G9520 Unspecified cord compression: Secondary | ICD-10-CM

## 2023-04-10 DIAGNOSIS — E1165 Type 2 diabetes mellitus with hyperglycemia: Secondary | ICD-10-CM

## 2023-04-10 MED ORDER — EMPAGLIFLOZIN 10 MG PO TABS
10.0000 mg | ORAL_TABLET | Freq: Every day | ORAL | 1 refills | Status: DC
  Filled 2023-07-21: qty 90, 90d supply, fill #0

## 2023-04-10 MED ORDER — SENNA 8.6 MG PO TABS: 1.0000 | ORAL_TABLET | Freq: Two times a day (BID) | ORAL | 5 refills | Status: DC

## 2023-04-10 MED ORDER — DULOXETINE HCL 20 MG PO CPEP
40.0000 mg | ORAL_CAPSULE | Freq: Every day | ORAL | 5 refills | Status: DC
Start: 2023-04-10 — End: 2023-09-29

## 2023-04-10 MED ORDER — OXYCODONE-ACETAMINOPHEN 5-325 MG PO TABS
1.0000 | ORAL_TABLET | Freq: Two times a day (BID) | ORAL | 0 refills | Status: DC | PRN
Start: 2023-04-10 — End: 2023-05-11

## 2023-04-10 MED ORDER — TIZANIDINE HCL 4 MG PO TABS: 2.0000 mg | ORAL_TABLET | Freq: Two times a day (BID) | ORAL | 5 refills | Status: DC | PRN

## 2023-04-10 NOTE — Telephone Encounter (Signed)
Patient calls nurse line regarding refill on Jardiance. Per note on 9/18, refill was denied.   Please advise if patient is no longer supposed to be on medication or if an appointment is needed prior to refill.   Veronda Prude, RN

## 2023-04-10 NOTE — Telephone Encounter (Signed)
Filled  Written  ID  Drug  QTY  Days  Prescriber  RX #  Dispenser  Refill  Daily Dose*  Pymt Type  PMP  03/26/2023 02/24/2023 1  Gabapentin 300 Mg Capsule 90.00 30 Me Lov [id number] [facility] ([facility]123) 1/5  Medicare [place] 03/02/2023 03/02/2023 1  Oxycodone-Acetaminophen 5-325 60.00 30 Me Lov [id number] [facility] (1123) 0/0 15.00 MME Medicare [place] 02/27/2023 02/24/2023 1  Gabapentin 300 Mg Capsule 90.00 30 Me Lov [id number] [facility] (1123) 0/5  Medicare [place]  Please send refill to [facility] in [place].

## 2023-04-15 ENCOUNTER — Other Ambulatory Visit: Payer: Self-pay | Admitting: Family Medicine

## 2023-04-15 DIAGNOSIS — I10 Essential (primary) hypertension: Secondary | ICD-10-CM

## 2023-04-17 ENCOUNTER — Encounter: Payer: Medicare HMO | Admitting: Occupational Therapy

## 2023-04-17 ENCOUNTER — Encounter: Payer: Self-pay | Admitting: Occupational Therapy

## 2023-04-17 NOTE — Therapy (Signed)
[facility] [address] [place], [place], [address] Phone: [phone]   Fax:  [phone]  Patient Details  Name: Emory Leaver MRN: [id number] Date of Birth: 10-07-1955 Referring Provider:  No ref. provider found  Encounter Date: 04/17/2023  OCCUPATIONAL THERAPY DISCHARGE SUMMARY  Visits from Start of Care: 4  Current functional level related to goals / functional outcomes: Pt has met all STG's and all but 1 LTG (Unable to assess d/t not returning for last visit)    Remaining deficits: Same as initial evaluation   Education / Equipment: HEP's, sensory and pain management strategies   Patient agrees to discharge. Patient goals were met. Patient is being discharged due to the patient's request..      Sheran Lawless, OT 04/17/2023, 1:31 PM  [facility] [address] [place], [place], [address] Phone: [phone]   Fax:  [phone]

## 2023-04-24 ENCOUNTER — Encounter: Payer: Medicare HMO | Admitting: Occupational Therapy

## 2023-05-08 ENCOUNTER — Ambulatory Visit (HOSPITAL_COMMUNITY): Admission: RE | Admit: 2023-05-08 | Payer: Medicare HMO | Source: Ambulatory Visit

## 2023-05-11 ENCOUNTER — Telehealth: Payer: Self-pay

## 2023-05-11 ENCOUNTER — Other Ambulatory Visit: Payer: Self-pay | Admitting: Family Medicine

## 2023-05-11 MED ORDER — SENNA 8.6 MG PO TABS: 1.0000 | ORAL_TABLET | Freq: Two times a day (BID) | ORAL | 5 refills | Status: AC

## 2023-05-11 MED ORDER — OXYCODONE-ACETAMINOPHEN 5-325 MG PO TABS: 1.0000 | ORAL_TABLET | Freq: Two times a day (BID) | ORAL | 0 refills | Status: DC | PRN

## 2023-05-11 NOTE — Telephone Encounter (Signed)
done

## 2023-05-31 ENCOUNTER — Other Ambulatory Visit: Payer: Self-pay

## 2023-05-31 DIAGNOSIS — I10 Essential (primary) hypertension: Secondary | ICD-10-CM

## 2023-05-31 MED ORDER — HYDROCHLOROTHIAZIDE 25 MG PO TABS: 25.0000 mg | ORAL_TABLET | Freq: Every day | ORAL | 0 refills | Status: DC

## 2023-05-31 MED ORDER — OLMESARTAN MEDOXOMIL 20 MG PO TABS: 20.0000 mg | ORAL_TABLET | Freq: Every day | ORAL | 0 refills | Status: AC

## 2023-06-02 ENCOUNTER — Encounter: Payer: Self-pay | Admitting: Physical Medicine and Rehabilitation

## 2023-06-02 ENCOUNTER — Encounter: Payer: Medicare HMO | Attending: Physical Medicine and Rehabilitation | Admitting: Physical Medicine and Rehabilitation

## 2023-06-02 VITALS — Ht 62.0 in | Wt 131.0 lb

## 2023-06-02 DIAGNOSIS — R252 Cramp and spasm: Secondary | ICD-10-CM | POA: Insufficient documentation

## 2023-06-02 DIAGNOSIS — M21372 Foot drop, left foot: Secondary | ICD-10-CM | POA: Diagnosis present

## 2023-06-02 DIAGNOSIS — G8250 Quadriplegia, unspecified: Secondary | ICD-10-CM | POA: Insufficient documentation

## 2023-06-02 MED ORDER — OXYCODONE-ACETAMINOPHEN 5-325 MG PO TABS: 1.0000 | ORAL_TABLET | Freq: Two times a day (BID) | ORAL | 0 refills | Status: DC | PRN

## 2023-06-02 NOTE — Progress Notes (Signed)
Pt is a 67 yr old female with hx of Traumatic incomplete ASIA D quadriplegia  (surgery July 4th 2024) due to ground level fall and severe cervical stenosis- with associated nerve and post fall pain; also has spasticity- and also has DM and HTN Here for  f/u for SCI  Still having  muscle jerks-  has spasms.    Balance is not good- can sit and hands fall to side or weaves torso- esp when standing.    Now has a  new walk-  10/31- stomping L leg-  a slap- of LLE-  but also  cannot even lift toes on L foot.    Body still getting stiff.  Doing Zanaflex 2 mg BID- did for a couple of weeks- went to 4 mg- tried 5 days-  had a big jerk- and had to sit down.  Went back to 2 mg BID- because of big jerk-    Most of pain from deltoid to mid tricep/lateral upper arm B/L And neck will hurt when leans down for a long period of time.    Percocet- BID- - and aleve taken when really hurting- it does the trick.  Also asking about sensory deficits in hands.    Exam: RLE- HF 4+/5; otherwise 5/5 in RLE LLE_ HF 5-/5; KE/KF 4+/5 and DF 2/5 and PF 4-/5  Neuro:  No increased tone in Ue's and LE's but brisk hoffmans' B/L and 2-3 beats clonus LE's-   Gait- has L foot slap- severe noted with gait- and sway s a lot more than before.   Plan: L foot drop- is completely new- so need ot work up- MRI of thoracic and lumbar spine- due to completely new onset of LLE foot drop and LLE weakness- was 5/5 in LLE at last appointment- now is 4+/5 in KE/KF as well as DF 2/5 and PF 4-/5- major change, need MRI to determine cause.  Concerning! Dr Franky Macho did July surgery.    2.   Doesn't sound so much like needs Trp injections- will not do today.    3. Don't want her driving until sees MRI- and knows what treatment to improve spasticity.    4. Explained sensation decrease in hands is likely from nerve damage from SCI.    5. Spasticity can get worse for 1-2 years. Right now, not severe- no side effects with Zanaflex-  doesn't want to increase Zanaflex- but I think would benefit from 4 mg (1 tab) 2x/day.  Con't but doesn't need refills-    6.  Refill On Percocet-  5/325 mg 2x/day for pain from surgery.  - will send in 2 refills- today- explained my goal is to get it down to [place] in the long run- I don't have her on it for Leg pain- have it for NECK pain. .    7.  F/U with Riley Lam q2 months- and me in 4 months double appt- SCI- with new foot slap If your leg gets weaker- go to ER to get MRI/work up done faster and treatment as needed.  Will call you when MRI back.    I spent a total of 42   minutes on total care today- >50% coordination of care- due to d/w new L foot drop and concern for new SCI/myelopathy- education on spasticity and chronci pain- and new work up.

## 2023-06-02 NOTE — Patient Instructions (Addendum)
Plan: L foot drop- is completely new- so need ot work up- MRI of thoracic and lumbar spine- due to completely new onset of LLE foot drop and LLE weakness- was 5/5 in LLE at last appointment- now is 4+/5 in KE/KF as well as DF 2/5 and PF 4-/5- major change, need MRI to determine cause.  Concerning! Dr Franky Macho did July surgery.    2.   Doesn't sound so much like needs Trp injections- will not do today.    3. Don't want her driving until sees MRI- and knows what treatment to improve spasticity.    4. Explained sensation decrease in hands is likely from nerve damage from SCI.    5. Spasticity can get worse for 1-2 years. Right now, not severe- no side effects with Zanaflex- doesn't want to increase Zanaflex- but I think would benefit from 4 mg (1 tab) 2x/day.  Con't but doesn't need refills-    6.  Refill On Percocet-  5/325 mg 2x/day for pain from surgery.  - will send in 2 refills- today- explained my goal is to get it down to [place] in the long run- I don't have her on it for Leg pain- have it for NECK pain. .    7.  F/U with Riley Lam q2 months- and me in 4 months double appt- SCI- with new foot slap  If your leg gets weaker- go to ER to get MRI/work up done faster and treatment as needed.  Will call you when MRI back.

## 2023-06-14 ENCOUNTER — Telehealth: Payer: Self-pay | Admitting: Specialist

## 2023-06-14 NOTE — Telephone Encounter (Signed)
Patient wanted to let Dr. Berline Chough know that she has an MRI scheduled at [facility] on 06/22/23.

## 2023-06-15 ENCOUNTER — Other Ambulatory Visit: Payer: Self-pay | Admitting: Family Medicine

## 2023-06-15 DIAGNOSIS — I10 Essential (primary) hypertension: Secondary | ICD-10-CM

## 2023-06-22 ENCOUNTER — Ambulatory Visit (HOSPITAL_COMMUNITY)
Admission: RE | Admit: 2023-06-22 | Discharge: 2023-06-22 | Disposition: A | Discharge status: Home / Self Care | Payer: Medicare HMO | Source: Ambulatory Visit | Attending: Physical Medicine and Rehabilitation | Admitting: Physical Medicine and Rehabilitation

## 2023-06-22 DIAGNOSIS — G8250 Quadriplegia, unspecified: Secondary | ICD-10-CM | POA: Diagnosis present

## 2023-06-22 DIAGNOSIS — M21372 Foot drop, left foot: Secondary | ICD-10-CM | POA: Insufficient documentation

## 2023-07-14 ENCOUNTER — Telehealth: Payer: Self-pay | Admitting: *Deleted

## 2023-07-14 NOTE — Telephone Encounter (Signed)
 Robin [name] called for refill on her oxycodone to [facility] in [place].

## 2023-07-17 MED ORDER — OXYCODONE-ACETAMINOPHEN 5-325 MG PO TABS: 1.0000 | ORAL_TABLET | Freq: Two times a day (BID) | ORAL | 0 refills | Status: DC | PRN

## 2023-07-19 ENCOUNTER — Telehealth: Payer: Self-pay

## 2023-07-19 NOTE — Telephone Encounter (Signed)
 PA Submitted for oxycodone  Robin [name] (Key: [id number])

## 2023-07-20 ENCOUNTER — Other Ambulatory Visit (HOSPITAL_COMMUNITY): Payer: Self-pay

## 2023-07-20 NOTE — Telephone Encounter (Signed)
 Oxycodone 5-325 mg approved 07/19/2023 - 01/16/2024.

## 2023-07-21 ENCOUNTER — Other Ambulatory Visit (HOSPITAL_COMMUNITY): Payer: Self-pay

## 2023-07-21 MED ORDER — ONETOUCH VERIO VI STRP: 1.0000 | ORAL_STRIP | 12 refills | Status: AC

## 2023-07-21 MED ORDER — ONETOUCH DELICA PLUS LANCET33G MISC: 5 refills | Status: AC

## 2023-07-21 MED ORDER — LIDOCAINE 5 % EX PTCH: 1.0000 | MEDICATED_PATCH | CUTANEOUS | 0 refills | Status: AC

## 2023-07-21 MED FILL — Metformin HCl Tab 500 MG: ORAL | 90 days supply | Qty: 180 | Fill #0 | Status: AC

## 2023-07-22 ENCOUNTER — Other Ambulatory Visit (HOSPITAL_COMMUNITY): Payer: Self-pay

## 2023-07-24 ENCOUNTER — Other Ambulatory Visit: Payer: Self-pay

## 2023-07-26 ENCOUNTER — Other Ambulatory Visit (HOSPITAL_COMMUNITY): Payer: Self-pay

## 2023-08-02 ENCOUNTER — Encounter: Payer: PPO | Admitting: Registered Nurse

## 2023-08-04 ENCOUNTER — Encounter: Payer: PPO | Admitting: Registered Nurse

## 2023-08-07 ENCOUNTER — Encounter: Payer: PPO | Attending: Physical Medicine and Rehabilitation | Admitting: Registered Nurse

## 2023-08-07 ENCOUNTER — Encounter: Payer: Self-pay | Admitting: Registered Nurse

## 2023-08-07 VITALS — BP 101/66 | HR 92 | Ht 64.0 in | Wt 127.8 lb

## 2023-08-07 DIAGNOSIS — G894 Chronic pain syndrome: Secondary | ICD-10-CM | POA: Insufficient documentation

## 2023-08-07 DIAGNOSIS — Z79891 Long term (current) use of opiate analgesic: Secondary | ICD-10-CM | POA: Insufficient documentation

## 2023-08-07 DIAGNOSIS — Z5181 Encounter for therapeutic drug level monitoring: Secondary | ICD-10-CM | POA: Insufficient documentation

## 2023-08-07 DIAGNOSIS — M5412 Radiculopathy, cervical region: Secondary | ICD-10-CM | POA: Insufficient documentation

## 2023-08-07 DIAGNOSIS — M7918 Myalgia, other site: Secondary | ICD-10-CM | POA: Insufficient documentation

## 2023-08-07 DIAGNOSIS — M542 Cervicalgia: Secondary | ICD-10-CM | POA: Diagnosis not present

## 2023-08-07 DIAGNOSIS — G9520 Unspecified cord compression: Secondary | ICD-10-CM | POA: Diagnosis not present

## 2023-08-07 DIAGNOSIS — G8250 Quadriplegia, unspecified: Secondary | ICD-10-CM | POA: Diagnosis not present
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
## 2023-08-08 ENCOUNTER — Ambulatory Visit: Payer: PPO | Admitting: Registered Nurse

## 2023-08-10 ENCOUNTER — Other Ambulatory Visit (HOSPITAL_COMMUNITY): Payer: Self-pay

## 2023-08-10 ENCOUNTER — Telehealth: Payer: Self-pay

## 2023-08-10 ENCOUNTER — Encounter: Payer: Self-pay | Admitting: Student

## 2023-08-10 ENCOUNTER — Ambulatory Visit: Payer: PPO | Admitting: Student

## 2023-08-10 VITALS — BP 122/72 | HR 99 | Ht 64.0 in | Wt 126.4 lb

## 2023-08-10 DIAGNOSIS — H1033 Unspecified acute conjunctivitis, bilateral: Secondary | ICD-10-CM | POA: Diagnosis not present

## 2023-08-10 DIAGNOSIS — E118 Type 2 diabetes mellitus with unspecified complications: Secondary | ICD-10-CM | POA: Diagnosis not present

## 2023-08-10 LAB — TOXASSURE SELECT,+ANTIDEPR,UR

## 2023-08-10 MED ORDER — AZELASTINE HCL 0.05 % OP SOLN: 1.0000 [drp] | Freq: Two times a day (BID) | OPHTHALMIC | 12 refills | Status: DC

## 2023-08-10 MED ORDER — CETIRIZINE HCL 10 MG PO TABS: 10.0000 mg | ORAL_TABLET | Freq: Every day | ORAL | 11 refills | Status: DC | PRN

## 2023-08-10 MED ORDER — AZELASTINE HCL 0.05 % OP SOLN
1.0000 [drp] | Freq: Two times a day (BID) | OPHTHALMIC | 12 refills | Status: DC
  Filled 2023-08-10: qty 6, 30d supply, fill #0

## 2023-08-10 MED ORDER — OFLOXACIN 0.3 % OP SOLN
1.0000 [drp] | Freq: Four times a day (QID) | OPHTHALMIC | 0 refills | Status: AC
  Filled 2023-08-10: qty 5, 25d supply, fill #0

## 2023-08-10 MED ORDER — OFLOXACIN 0.3 % OP SOLN: 1.0000 [drp] | Freq: Four times a day (QID) | OPHTHALMIC | 0 refills | Status: DC

## 2023-08-10 MED ORDER — CETIRIZINE HCL 10 MG PO TABS
10.0000 mg | ORAL_TABLET | Freq: Every day | ORAL | 11 refills | Status: AC | PRN
  Filled 2023-08-10: qty 100, 100d supply, fill #0

## 2023-08-10 NOTE — Assessment & Plan Note (Addendum)
Patient comes in with 3 weeks of itchy eyes bilaterally, following visiting her husband in the hospital touching the blinds.  Patient has tried numerous over-the-counter eyedrops without success.  Patient tried olopatadine without success.  Patient appreciates some mucus/discharge in the corners of her eyes in the morning, and watery eyes throughout the day, but denies any matting of the eyes or mucous but so thick it keeps eyes shut.  Suspect this is viral versus allergic conjunctivitis, low suspicion for bacterial conjunctivitis.  Will treat with antihistamine eyedrops, and also provide antibiotic eyedrops if antihistamine not working. - Azelastine twice daily, both eyes x 3 to 4 days. - Ofloxacin every 6 hours x 5 days, if azelastine not working - Zyrtec 10 mg daily

## 2023-08-10 NOTE — Progress Notes (Signed)
  SUBJECTIVE:   CHIEF COMPLAINT / HPI:   Itchy Eyes Eyes have been itchy and red for 3 weeks. Happened when she was visiting husband in hospital, has been itching since. Nothing is working has tried Engineer, civil (consulting).   DM2 Taking metformin twice a day, no issues.  -wants A1c checked   PERTINENT  PMH / PSH:    OBJECTIVE:  BP 122/72   Pulse 99   Ht 5\' 4"  (1.626 m)   Wt 126 lb 6.4 oz (57.3 kg)   LMP 08/11/2011   SpO2 100%   BMI 21.70 kg/m  Physical Exam Eyes:     General: Lids are normal. Vision grossly intact. Allergic shiner present.        Right eye: Discharge present.        Left eye: Discharge present.    Extraocular Movements: Extraocular movements intact.     Right eye: Normal extraocular motion.     Left eye: Normal extraocular motion.     Conjunctiva/sclera:     Right eye: Right conjunctiva is injected.     Left eye: Left conjunctiva is injected.      ASSESSMENT/PLAN:   Assessment & Plan Controlled type 2 diabetes mellitus with complication, without long-term current use of insulin (HCC) Patient wanting checkup on her diabetes.  Patient reports good compliance with metformin daily, no symptoms. - A1c Acute conjunctivitis of both eyes, unspecified acute conjunctivitis type Patient comes in with 3 weeks of itchy eyes bilaterally, following visiting her husband in the hospital touching the blinds.  Patient has tried numerous over-the-counter eyedrops without success.  Patient tried olopatadine without success.  Patient appreciates some mucus/discharge in the corners of her eyes in the morning, and watery eyes throughout the day, but denies any matting of the eyes or mucous but so thick it keeps eyes shut.  Suspect this is viral versus allergic conjunctivitis, low suspicion for bacterial conjunctivitis.  Will treat with antihistamine eyedrops, and also provide antibiotic eyedrops if antihistamine not working. - Azelastine twice daily, both eyes x 3 to 4 days. - Ofloxacin  every 6 hours x 5 days, if azelastine not working - Zyrtec 10 mg daily No follow-ups on file. Bess Kinds, MD 08/10/2023, 4:52 PM PGY-3, [facility]

## 2023-08-10 NOTE — Telephone Encounter (Signed)
Patient calls nurse line regarding prescriptions from today's visit. She states that medications were supposed to be sent to [facility] pharmacy, however, they were sent to [facility].   Called and canceled prescriptions at [facility] and resent to [facility].   Veronda Prude, RN

## 2023-08-10 NOTE — Assessment & Plan Note (Addendum)
Patient wanting checkup on her diabetes.  Patient reports good compliance with metformin daily, no symptoms. - A1c

## 2023-08-10 NOTE — Patient Instructions (Addendum)
It was great to see you! Thank you for allowing me to participate in your care!  I recommend that you always bring your medications to each appointment as this makes it easy to ensure we are on the correct medications and helps [place] not miss when refills are needed.  Our plans for today:  - Itchy eyes  Azelastine eye drops twice a day, both eyes, for 4-5 days   If no improvement try:   Ofloxacin eye drops every 6 hours, for 5 days   Take Zyrtec daily   - Diabetes  Checking A1c  Continue Metformin twice a day  We are checking some labs today, I will call you if they are abnormal will send you a MyChart message or a letter if they are normal.  If you do not hear about your labs in the next 2 weeks please let us know.  Take care and seek immediate care sooner if you develop any concerns.   Dr. Bess Kinds, MD [facility]

## 2023-08-14 ENCOUNTER — Other Ambulatory Visit: Payer: Self-pay | Admitting: *Deleted

## 2023-08-14 ENCOUNTER — Telehealth: Payer: Self-pay | Admitting: *Deleted

## 2023-08-14 ENCOUNTER — Other Ambulatory Visit (HOSPITAL_COMMUNITY): Payer: Self-pay

## 2023-08-14 MED ORDER — GABAPENTIN 300 MG PO CAPS
300.0000 mg | ORAL_CAPSULE | Freq: Three times a day (TID) | ORAL | 5 refills | Status: DC
  Filled 2023-08-14: qty 90, 30d supply, fill #0
  Filled 2023-09-11 – 2023-09-12 (×2): qty 90, 30d supply, fill #1

## 2023-08-14 NOTE — Telephone Encounter (Signed)
Requested that her gabapentin be sent to the [facility]. It has been sent.

## 2023-08-25 ENCOUNTER — Telehealth: Payer: Self-pay | Admitting: Physical Medicine and Rehabilitation

## 2023-08-25 ENCOUNTER — Other Ambulatory Visit (HOSPITAL_COMMUNITY): Payer: Self-pay

## 2023-08-25 MED ORDER — OXYCODONE-ACETAMINOPHEN 5-325 MG PO TABS
1.0000 | ORAL_TABLET | Freq: Two times a day (BID) | ORAL | 0 refills | Status: DC | PRN
  Filled 2023-08-25: qty 60, 30d supply, fill #0

## 2023-08-25 NOTE — Telephone Encounter (Signed)
Patient called in and LVM 2/13 at 3:17pm requesting medication refill on oxyCODONE-acetaminophen (PERCOCET) 5-325 MG tablet  , she will run out Saturday , patient would like it sent to [facility] outpatient pharmacy

## 2023-08-28 ENCOUNTER — Other Ambulatory Visit (HOSPITAL_COMMUNITY): Payer: Self-pay

## 2023-09-11 ENCOUNTER — Other Ambulatory Visit (HOSPITAL_COMMUNITY): Payer: Self-pay

## 2023-09-18 ENCOUNTER — Other Ambulatory Visit (HOSPITAL_COMMUNITY): Payer: Self-pay

## 2023-09-19 ENCOUNTER — Ambulatory Visit: Payer: Medicare HMO | Admitting: Internal Medicine
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
## 2023-09-26 ENCOUNTER — Telehealth: Payer: Self-pay | Admitting: Physical Medicine and Rehabilitation

## 2023-09-26 MED ORDER — OXYCODONE-ACETAMINOPHEN 5-325 MG PO TABS
1.0000 | ORAL_TABLET | Freq: Two times a day (BID) | ORAL | 0 refills | Status: DC | PRN
Start: 2023-09-26 — End: 2023-09-28

## 2023-09-26 NOTE — Telephone Encounter (Signed)
 Requesting refill for Percocet please send to [facility]

## 2023-09-27 ENCOUNTER — Other Ambulatory Visit (HOSPITAL_COMMUNITY): Payer: Self-pay

## 2023-09-28 ENCOUNTER — Telehealth: Payer: Self-pay | Admitting: *Deleted

## 2023-09-28 ENCOUNTER — Other Ambulatory Visit (HOSPITAL_COMMUNITY): Payer: Self-pay

## 2023-09-28 ENCOUNTER — Encounter: Payer: Self-pay | Admitting: *Deleted

## 2023-09-28 MED ORDER — OXYCODONE-ACETAMINOPHEN 5-325 MG PO TABS
1.0000 | ORAL_TABLET | Freq: Two times a day (BID) | ORAL | 0 refills | Status: DC | PRN
  Filled 2023-09-28: qty 60, 30d supply, fill #0

## 2023-09-28 NOTE — Telephone Encounter (Signed)
 Robin [name] asked that we send her percocet to [facility] pharmacy. I have canceled the Rx at [facility].

## 2023-09-29 ENCOUNTER — Other Ambulatory Visit (HOSPITAL_COMMUNITY): Payer: Self-pay

## 2023-09-29 ENCOUNTER — Other Ambulatory Visit: Payer: Self-pay

## 2023-09-29 ENCOUNTER — Encounter: Payer: Self-pay | Admitting: Physical Medicine and Rehabilitation

## 2023-09-29 ENCOUNTER — Encounter: Payer: Medicare HMO | Attending: Physical Medicine and Rehabilitation | Admitting: Physical Medicine and Rehabilitation

## 2023-09-29 VITALS — BP 167/77 | HR 84 | Ht 64.0 in | Wt 133.0 lb

## 2023-09-29 DIAGNOSIS — M792 Neuralgia and neuritis, unspecified: Secondary | ICD-10-CM | POA: Diagnosis not present

## 2023-09-29 DIAGNOSIS — M21372 Foot drop, left foot: Secondary | ICD-10-CM

## 2023-09-29 DIAGNOSIS — G9520 Unspecified cord compression: Secondary | ICD-10-CM | POA: Diagnosis not present

## 2023-09-29 DIAGNOSIS — R269 Unspecified abnormalities of gait and mobility: Secondary | ICD-10-CM | POA: Diagnosis not present

## 2023-09-29 DIAGNOSIS — I10 Essential (primary) hypertension: Secondary | ICD-10-CM

## 2023-09-29 DIAGNOSIS — R252 Cramp and spasm: Secondary | ICD-10-CM

## 2023-09-29 MED ORDER — OXYCODONE-ACETAMINOPHEN 5-325 MG PO TABS
1.0000 | ORAL_TABLET | Freq: Two times a day (BID) | ORAL | 0 refills | Status: DC | PRN
  Filled 2023-09-29 – 2023-11-02 (×3): qty 60, 30d supply, fill #0

## 2023-09-29 MED ORDER — DULOXETINE HCL 20 MG PO CPEP
40.0000 mg | ORAL_CAPSULE | Freq: Every day | ORAL | 5 refills | Status: DC
  Filled 2023-09-29: qty 60, 30d supply, fill #0
  Filled 2023-11-02: qty 60, 30d supply, fill #1
  Filled 2023-12-11: qty 60, 30d supply, fill #2
  Filled 2024-01-19: qty 60, 30d supply, fill #3

## 2023-09-29 MED ORDER — TIZANIDINE HCL 4 MG PO TABS
4.0000 mg | ORAL_TABLET | Freq: Two times a day (BID) | ORAL | 5 refills | Status: DC
  Filled 2023-09-29: qty 60, 30d supply, fill #0
  Filled 2023-11-20: qty 60, 30d supply, fill #1
  Filled 2023-12-22: qty 60, 30d supply, fill #2
  Filled 2024-01-19: qty 60, 30d supply, fill #3

## 2023-09-29 MED ORDER — GABAPENTIN 600 MG PO TABS
ORAL_TABLET | ORAL | 5 refills | Status: DC
  Filled 2023-09-29: qty 90, 30d supply, fill #0
  Filled 2023-11-20: qty 90, 30d supply, fill #1
  Filled 2023-12-22: qty 90, 30d supply, fill #2
  Filled 2024-01-19: qty 90, 30d supply, fill #3

## 2023-09-29 MED ORDER — OXYCODONE-ACETAMINOPHEN 5-325 MG PO TABS
1.0000 | ORAL_TABLET | Freq: Two times a day (BID) | ORAL | 0 refills | Status: DC | PRN
  Filled 2023-09-29 – 2023-11-03 (×3): qty 60, 30d supply, fill #0
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
## 2023-09-29 NOTE — Patient Instructions (Signed)
 Pt is a 68 yr old female with hx of Traumatic incomplete ASIA D quadriplegia  (surgery July 4th 2024) due to ground level fall and severe cervical stenosis- with associated nerve and post fall pain; also has spasticity- and also has DM and HTN Here for  f/u for SCI        Will order ENG/NCS- LLE due to new foot drop/foot slap- by Dr Shearon Stalls   2. Ue's burning pain- will increase Gabapentin to 600 mg 2x/day x 1 week, then increase to 600 mg 3xday- for nerve pain. Can cause sleepiness/slowing.      3. Con't Duloxetine/Cymbalta 40 mg nightly for nerve pain-      4. Will refill Percocet 1 tab 2x/day- last refill yesterday- sent in 2months supply     5. Foot up brace - can get online- usually can help keep foot up- IF this doesn't work, you have to call me!!! Supposed to keep your toes up more.  Working means no falling, when wearing it- but call me if still stubbing toe.    6. If it doesn't work, then we need a traditional AFO- ankle foot orthotic- that would go into your shoe and goes up to back of your calf. Would send a Rx to [facility] for this is new Foot up brace doesn't work well enough.    7. No driving at this time.      8. Last got UDS 1/27- so not due at this time.      9. F/U in 2 months with Riley Lam and 4 months with me

## 2023-09-30 MED ORDER — ASPIRIN 81 MG PO TBEC
81.0000 mg | DELAYED_RELEASE_TABLET | Freq: Every day | ORAL | 3 refills | Status: AC
  Filled 2023-09-30: qty 90, 90d supply, fill #0
  Filled 2023-12-22: qty 90, 90d supply, fill #1
  Filled 2024-05-02: qty 90, 90d supply, fill #2
  Filled 2024-07-19: qty 90, 90d supply, fill #3

## 2023-09-30 MED ORDER — HYDROCHLOROTHIAZIDE 25 MG PO TABS
25.0000 mg | ORAL_TABLET | Freq: Every day | ORAL | 0 refills | Status: DC
  Filled 2023-09-30: qty 90, 90d supply, fill #0

## 2023-10-02 ENCOUNTER — Other Ambulatory Visit: Payer: Self-pay

## 2023-10-02 ENCOUNTER — Other Ambulatory Visit (HOSPITAL_COMMUNITY): Payer: Self-pay

## 2023-10-04 ENCOUNTER — Other Ambulatory Visit (HOSPITAL_COMMUNITY): Payer: Self-pay

## 2023-10-04 DIAGNOSIS — I10 Essential (primary) hypertension: Secondary | ICD-10-CM | POA: Diagnosis not present

## 2023-10-04 DIAGNOSIS — R32 Unspecified urinary incontinence: Secondary | ICD-10-CM | POA: Diagnosis not present

## 2023-10-04 DIAGNOSIS — E785 Hyperlipidemia, unspecified: Secondary | ICD-10-CM | POA: Diagnosis not present

## 2023-10-04 DIAGNOSIS — M1990 Unspecified osteoarthritis, unspecified site: Secondary | ICD-10-CM | POA: Diagnosis not present

## 2023-10-04 DIAGNOSIS — F17210 Nicotine dependence, cigarettes, uncomplicated: Secondary | ICD-10-CM | POA: Diagnosis not present

## 2023-10-04 DIAGNOSIS — Z7982 Long term (current) use of aspirin: Secondary | ICD-10-CM | POA: Diagnosis not present

## 2023-10-04 DIAGNOSIS — E1169 Type 2 diabetes mellitus with other specified complication: Secondary | ICD-10-CM | POA: Diagnosis not present

## 2023-10-04 DIAGNOSIS — G8929 Other chronic pain: Secondary | ICD-10-CM | POA: Diagnosis not present

## 2023-10-04 DIAGNOSIS — K5900 Constipation, unspecified: Secondary | ICD-10-CM | POA: Diagnosis not present

## 2023-10-05 ENCOUNTER — Ambulatory Visit: Admitting: Family Medicine

## 2023-10-05 ENCOUNTER — Other Ambulatory Visit: Payer: Self-pay

## 2023-10-05 ENCOUNTER — Other Ambulatory Visit (HOSPITAL_COMMUNITY): Payer: Self-pay

## 2023-10-05 VITALS — BP 136/69 | HR 89 | Ht 64.0 in | Wt 130.2 lb

## 2023-10-05 DIAGNOSIS — Z1231 Encounter for screening mammogram for malignant neoplasm of breast: Secondary | ICD-10-CM

## 2023-10-05 DIAGNOSIS — E042 Nontoxic multinodular goiter: Secondary | ICD-10-CM | POA: Diagnosis not present

## 2023-10-05 DIAGNOSIS — E1165 Type 2 diabetes mellitus with hyperglycemia: Secondary | ICD-10-CM

## 2023-10-05 DIAGNOSIS — Z720 Tobacco use: Secondary | ICD-10-CM | POA: Diagnosis not present

## 2023-10-05 DIAGNOSIS — F4321 Adjustment disorder with depressed mood: Secondary | ICD-10-CM

## 2023-10-05 LAB — POCT GLYCOSYLATED HEMOGLOBIN (HGB A1C): HbA1c, POC (controlled diabetic range): 12.1 % — AB (ref 0.0–7.0)

## 2023-10-05 MED ORDER — GLIPIZIDE 5 MG PO TABS: 5.0000 mg | ORAL_TABLET | Freq: Every day | ORAL | 3 refills | Status: DC

## 2023-10-05 MED ORDER — METFORMIN HCL 500 MG PO TABS
1000.0000 mg | ORAL_TABLET | Freq: Two times a day (BID) | ORAL | 3 refills | Status: AC
  Filled 2023-10-05 – 2023-12-22 (×2): qty 180, 45d supply, fill #0
  Filled 2024-03-12: qty 180, 45d supply, fill #1
  Filled 2024-05-02: qty 180, 45d supply, fill #2
  Filled 2024-06-05: qty 180, 45d supply, fill #3

## 2023-10-05 MED ORDER — GLIPIZIDE 5 MG PO TABS
5.0000 mg | ORAL_TABLET | Freq: Every day | ORAL | 3 refills | Status: DC
  Filled 2023-10-05: qty 90, 90d supply, fill #0

## 2023-10-05 MED ORDER — METFORMIN HCL 500 MG PO TABS: 1000.0000 mg | ORAL_TABLET | Freq: Two times a day (BID) | ORAL | 3 refills | Status: DC

## 2023-10-05 MED ORDER — VARENICLINE TARTRATE 0.5 MG PO TABS
0.5000 mg | ORAL_TABLET | Freq: Two times a day (BID) | ORAL | 1 refills | Status: AC
  Filled 2023-10-05: qty 180, 90d supply, fill #0

## 2023-10-05 MED ORDER — VARENICLINE TARTRATE 0.5 MG PO TABS: 0.5000 mg | ORAL_TABLET | Freq: Two times a day (BID) | ORAL | 1 refills | Status: DC

## 2023-10-05 NOTE — Progress Notes (Signed)
    SUBJECTIVE:   CHIEF COMPLAINT / HPI: T2DM f/u  T2DM Jardiance 10mg  Last A1c 7.7 Has been eating poorly. States she has been eating stuff she should not eat. Drinking juice. Taking Metformin. Has not noticed new increased urination, stomach pain, vomiting or thirst  Grief Husband passed away.  Going to grief counseling.  Started smoking again several months ago.  15 cigarettes per day. Wants to quit again. Had success previously with Chantix.   PERTINENT  PMH / PSH: T2DM, PAD, HTN  OBJECTIVE:   BP 136/69   Pulse 89   Ht 5\' 4"  (1.626 m)   Wt 130 lb 3.2 oz (59.1 kg)   LMP 08/11/2011   SpO2 97%   BMI 22.35 kg/m   General: NAD, well appearing, frail appearing, teary Neuro: A&O, walks with cane Respiratory: normal WOB on RA Extremities: Moving all 4 extremities equally, brace on left ankle   ASSESSMENT/PLAN:   Assessment & Plan Uncontrolled type 2 diabetes mellitus with hyperglycemia (HCC) Uncontrolled 12.1 today. No DKA changes. -Increase metformin to 1000 BID. -Continue Jardiance -Glipizide 5mg  daily -Follow-up 3 months. -Dietary changes discussed Encounter for screening mammogram for malignant neoplasm of breast -Screening mammagram ordered Multiple thyroid nodules -Repeat referral to endocrinology placed. Tobacco abuse Patient motivated quit now. -Restart Chantix -CT Lung Cancer screening Grief reaction PHQ-9 question 9 zero. Patient taking appropriate steps with counseling. Has family support.  Return in about 3 months (around 01/05/2024) for A1c.  Celine Mans, MD [facility]

## 2023-10-05 NOTE — Assessment & Plan Note (Addendum)
 Uncontrolled 12.1 today. No DKA changes. -Increase metformin to 1000 BID. -Continue Jardiance -Glipizide 5mg  daily -Follow-up 3 months. -Dietary changes discussed

## 2023-10-05 NOTE — Assessment & Plan Note (Signed)
 Patient motivated quit now. -Restart Chantix -CT Lung Cancer screening

## 2023-10-05 NOTE — Assessment & Plan Note (Signed)
-  Repeat referral to endocrinology placed.

## 2023-10-05 NOTE — Assessment & Plan Note (Deleted)
 Uncontrolled 12.1 today. -Increase metformin to 1000 BID.

## 2023-10-05 NOTE — Patient Instructions (Addendum)
 It was great to see you! Thank you for allowing me to participate in your care!  Our plans for today:  - I have sent Chantix to your pharmacy. - Please check you sugar daily in the morning before eating. - Please start taking Glipizde 5mg  daily. - Increase your Metformin to twice daily. - Please try to improve your diet.   Please arrive 15 minutes PRIOR to your next scheduled appointment time! If you do not, this affects OTHER patients' care.  Take care and seek immediate care sooner if you develop any concerns.   Celine Mans, MD, PGY-2 [facility] 2:54 PM 10/05/2023  [facility]

## 2023-10-06 LAB — MICROALBUMIN / CREATININE URINE RATIO
Creatinine, Urine: 59.1 mg/dL
Microalb/Creat Ratio: <5 mg/g{creat} (ref 0–29)
Microalbumin, Urine: <3 ug/mL

## 2023-10-08 ENCOUNTER — Encounter: Payer: Self-pay | Admitting: Family Medicine

## 2023-10-17 ENCOUNTER — Other Ambulatory Visit (HOSPITAL_COMMUNITY): Payer: Self-pay

## 2023-10-19 ENCOUNTER — Ambulatory Visit: Admitting: Internal Medicine
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
## 2023-10-31 NOTE — Telephone Encounter (Signed)
 error

## 2023-11-02 ENCOUNTER — Other Ambulatory Visit: Payer: Self-pay

## 2023-11-02 ENCOUNTER — Other Ambulatory Visit (HOSPITAL_COMMUNITY): Payer: Self-pay

## 2023-11-02 ENCOUNTER — Telehealth: Payer: Self-pay | Admitting: Registered Nurse

## 2023-11-02 NOTE — Telephone Encounter (Signed)
 Patient is out of percocet, needs sent to [facility]

## 2023-11-02 NOTE — Telephone Encounter (Signed)
 PMP was Reviewed.  [facility] was called.  [name] has a prescription of file, spoke with Pharmacist. [name] needs a PA.  Message routed to [facility].

## 2023-11-03 ENCOUNTER — Other Ambulatory Visit (HOSPITAL_COMMUNITY): Payer: Self-pay

## 2023-11-20 ENCOUNTER — Other Ambulatory Visit (HOSPITAL_COMMUNITY): Payer: Self-pay

## 2023-11-21 ENCOUNTER — Other Ambulatory Visit (HOSPITAL_COMMUNITY): Payer: Self-pay

## 2023-11-29 ENCOUNTER — Encounter: Attending: Physical Medicine and Rehabilitation | Admitting: Registered Nurse

## 2023-11-29 ENCOUNTER — Encounter: Payer: Self-pay | Admitting: Registered Nurse

## 2023-11-29 ENCOUNTER — Other Ambulatory Visit (HOSPITAL_COMMUNITY): Payer: Self-pay

## 2023-11-29 VITALS — BP 116/76 | HR 96 | Ht 64.0 in | Wt 123.0 lb

## 2023-11-29 DIAGNOSIS — Z79891 Long term (current) use of opiate analgesic: Secondary | ICD-10-CM | POA: Diagnosis not present

## 2023-11-29 DIAGNOSIS — Z5181 Encounter for therapeutic drug level monitoring: Secondary | ICD-10-CM

## 2023-11-29 DIAGNOSIS — M25512 Pain in left shoulder: Secondary | ICD-10-CM | POA: Diagnosis not present

## 2023-11-29 DIAGNOSIS — M21372 Foot drop, left foot: Secondary | ICD-10-CM | POA: Diagnosis not present

## 2023-11-29 DIAGNOSIS — G894 Chronic pain syndrome: Secondary | ICD-10-CM | POA: Diagnosis not present

## 2023-11-29 DIAGNOSIS — M542 Cervicalgia: Secondary | ICD-10-CM

## 2023-11-29 DIAGNOSIS — G9520 Unspecified cord compression: Secondary | ICD-10-CM | POA: Diagnosis not present

## 2023-11-29 DIAGNOSIS — M7062 Trochanteric bursitis, left hip: Secondary | ICD-10-CM | POA: Diagnosis not present

## 2023-11-29 DIAGNOSIS — G8929 Other chronic pain: Secondary | ICD-10-CM | POA: Diagnosis not present

## 2023-11-29 DIAGNOSIS — R42 Dizziness and giddiness: Secondary | ICD-10-CM

## 2023-11-29 DIAGNOSIS — M25511 Pain in right shoulder: Secondary | ICD-10-CM | POA: Insufficient documentation

## 2023-11-29 DIAGNOSIS — M7061 Trochanteric bursitis, right hip: Secondary | ICD-10-CM | POA: Diagnosis not present

## 2023-11-29 MED ORDER — OXYCODONE-ACETAMINOPHEN 5-325 MG PO TABS
1.0000 | ORAL_TABLET | Freq: Two times a day (BID) | ORAL | 0 refills | Status: DC | PRN
  Filled 2023-11-29 – 2023-12-01 (×2): qty 60, 30d supply, fill #0
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
## 2023-11-30 ENCOUNTER — Ambulatory Visit (INDEPENDENT_AMBULATORY_CARE_PROVIDER_SITE_OTHER)

## 2023-11-30 VITALS — Ht 64.0 in | Wt 123.0 lb

## 2023-11-30 DIAGNOSIS — Z0000 Encounter for general adult medical examination without abnormal findings: Secondary | ICD-10-CM | POA: Diagnosis not present

## 2023-11-30 NOTE — Patient Instructions (Signed)
 Robin [name] , Thank you for taking time out of your busy schedule to complete your Annual Wellness Visit with me. I enjoyed our conversation and look forward to speaking with you again next year. I, as well as your care team,  appreciate your ongoing commitment to your health goals. Please review the following plan we discussed and let me know if I can assist you in the future. Your Game plan/ To Do List    Referrals: If you haven't heard from the office you've been referred to, please reach out to them at the phone provided.   Follow up Visits: Next Medicare AWV with our clinical staff: 12/09/2024 at 2:20 p.m. phone visit with Nurse Health Advisor   Have you seen your provider in the last 6 months (3 months if uncontrolled diabetes)? Yes Next Office Visit with your provider: 01/02/2024 at 8:50 a.m. with Dr. Irby Mannan for diabetic care  Clinician Recommendations:  Aim for 30 minutes of exercise or brisk walking, 6-8 glasses of water, and 5 servings of fruits and vegetables each day.       This is a list of the screening recommended for you and due dates:  Health Maintenance  Topic Date Due   Zoster (Shingles) Vaccine (1 of 2) Never done   Mammogram  11/27/2022   DTaP/Tdap/Td vaccine (3 - Td or Tdap) 01/19/2023   COVID-19 Vaccine (3 - 2024-25 season) 03/12/2023   Complete foot exam   11/01/2023   Eye exam for diabetics  12/30/2023   Hemoglobin A1C  01/05/2024   Flu Shot  02/09/2024   Lipid (cholesterol) test  02/16/2024   Yearly kidney function blood test for diabetes  02/29/2024   Yearly kidney health urinalysis for diabetes  10/04/2024   Medicare Annual Wellness Visit  11/29/2024   Colon Cancer Screening  01/24/2027   Pneumonia Vaccine  Completed   DEXA scan (bone density measurement)  Completed   Hepatitis C Screening  Completed   HPV Vaccine  Aged Out   Meningitis B Vaccine  Aged Out    Advanced directives: (Declined) Advance directive discussed with you today. Even though  you declined this today, please call our office should you change your mind, and we can give you the proper paperwork for you to fill out. Advance Care Planning is important because it:  [x]  Makes sure you receive the medical care that is consistent with your values, goals, and preferences  [x]  It provides guidance to your family and loved ones and reduces their decisional burden about whether or not they are making the right decisions based on your wishes.  Follow the link provided in your after visit summary or read over the paperwork we have mailed to you to help you started getting your Advance Directives in place. If you need assistance in completing these, please reach out to us  so that we can help you!  See attachments for Preventive Care and Fall Prevention Tips.
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
## 2023-12-01 ENCOUNTER — Other Ambulatory Visit (HOSPITAL_COMMUNITY): Payer: Self-pay

## 2023-12-03 LAB — DRUG TOX MONITOR 1 W/CONF, ORAL FLD
Amphetamines: NEGATIVE ng/mL (ref <10)
Barbiturates: NEGATIVE ng/mL (ref <10)
Benzodiazepines: NEGATIVE ng/mL (ref <0.50)
Buprenorphine: NEGATIVE ng/mL (ref <0.10)
Cocaine: NEGATIVE ng/mL (ref <5.0)
Codeine: NEGATIVE ng/mL (ref <2.5)
Cotinine: 94.6 ng/mL — ABNORMAL HIGH (ref <5.0)
Dihydrocodeine: NEGATIVE ng/mL (ref <2.5)
Fentanyl: NEGATIVE ng/mL (ref <0.10)
Heroin Metabolite: NEGATIVE ng/mL (ref <1.0)
Hydrocodone: NEGATIVE ng/mL (ref <2.5)
Hydromorphone: NEGATIVE ng/mL (ref <2.5)
MARIJUANA: NEGATIVE ng/mL (ref <2.5)
MDMA: NEGATIVE ng/mL (ref <10)
Meprobamate: NEGATIVE ng/mL (ref <2.5)
Methadone: NEGATIVE ng/mL (ref <5.0)
Morphine: NEGATIVE ng/mL (ref <2.5)
Nicotine Metabolite: POSITIVE ng/mL — AB (ref <5.0)
Norhydrocodone: NEGATIVE ng/mL (ref <2.5)
Noroxycodone: 17.2 ng/mL — ABNORMAL HIGH (ref <2.5)
Opiates: POSITIVE ng/mL — AB (ref <2.5)
Oxycodone: 122.7 ng/mL — ABNORMAL HIGH (ref <2.5)
Oxymorphone: NEGATIVE ng/mL (ref <2.5)
Phencyclidine: NEGATIVE ng/mL (ref <10)
THC: NEGATIVE ng/mL (ref <2.5)
Tapentadol: NEGATIVE ng/mL (ref <5.0)
Tramadol: NEGATIVE ng/mL (ref <5.0)
Zolpidem: NEGATIVE ng/mL (ref <5.0)

## 2023-12-03 LAB — DRUG TOX ALC METAB W/CON, ORAL FLD: Alcohol Metabolite: NEGATIVE ng/mL (ref <25)

## 2023-12-05 ENCOUNTER — Other Ambulatory Visit (HOSPITAL_COMMUNITY): Payer: Self-pay

## 2023-12-06 ENCOUNTER — Encounter: Payer: Self-pay | Admitting: Physical Medicine and Rehabilitation

## 2023-12-06 ENCOUNTER — Encounter: Admitting: Physical Medicine and Rehabilitation

## 2023-12-06 VITALS — BP 150/78 | HR 85 | Ht 64.0 in | Wt 127.4 lb

## 2023-12-06 DIAGNOSIS — M21372 Foot drop, left foot: Secondary | ICD-10-CM

## 2023-12-06 DIAGNOSIS — Z79891 Long term (current) use of opiate analgesic: Secondary | ICD-10-CM | POA: Diagnosis not present

## 2023-12-06 NOTE — Progress Notes (Signed)
 EMG LLE performed. Results will be scanned into chart.

## 2023-12-11 ENCOUNTER — Other Ambulatory Visit (HOSPITAL_COMMUNITY): Payer: Self-pay

## 2023-12-11 ENCOUNTER — Other Ambulatory Visit: Payer: Self-pay | Admitting: Family Medicine

## 2023-12-11 DIAGNOSIS — E782 Mixed hyperlipidemia: Secondary | ICD-10-CM

## 2023-12-11 MED ORDER — ATORVASTATIN CALCIUM 80 MG PO TABS
80.0000 mg | ORAL_TABLET | Freq: Every day | ORAL | 3 refills | Status: AC
  Filled 2023-12-11: qty 90, 90d supply, fill #0
  Filled 2024-03-12: qty 90, 90d supply, fill #1
  Filled 2024-06-05: qty 90, 90d supply, fill #2

## 2023-12-14 ENCOUNTER — Other Ambulatory Visit (HOSPITAL_COMMUNITY): Payer: Self-pay
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
## 2023-12-22 ENCOUNTER — Other Ambulatory Visit: Payer: Self-pay

## 2023-12-22 ENCOUNTER — Other Ambulatory Visit (HOSPITAL_COMMUNITY): Payer: Self-pay

## 2023-12-26 ENCOUNTER — Ambulatory Visit: Admitting: Internal Medicine

## 2023-12-26 VITALS — BP 134/88 | HR 85 | Ht 64.0 in | Wt 128.0 lb

## 2023-12-26 DIAGNOSIS — E042 Nontoxic multinodular goiter: Secondary | ICD-10-CM | POA: Diagnosis not present

## 2023-12-26 DIAGNOSIS — E0590 Thyrotoxicosis, unspecified without thyrotoxic crisis or storm: Secondary | ICD-10-CM | POA: Diagnosis not present
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
## 2023-12-27 ENCOUNTER — Ambulatory Visit: Payer: Self-pay | Admitting: Internal Medicine

## 2023-12-27 LAB — T3, FREE: T3, Free: 3 pg/mL (ref 2.3–4.2)

## 2023-12-27 LAB — T4, FREE: Free T4: 1.2 ng/dL (ref 0.8–1.8)

## 2023-12-27 LAB — TSH: TSH: 1.21 m[IU]/L (ref 0.40–4.50)

## 2024-01-02 ENCOUNTER — Ambulatory Visit: Admitting: Family Medicine

## 2024-01-02 ENCOUNTER — Telehealth: Payer: Self-pay

## 2024-01-02 NOTE — Progress Notes (Deleted)
    SUBJECTIVE:   CHIEF COMPLAINT / HPI: diabetes f/u  ***  PERTINENT  PMH / PSH: PAD, HTN, T2DM  OBJECTIVE:   LMP 08/11/2011   ***  Foot Exam -   ASSESSMENT/PLAN:   Assessment & Plan  No follow-ups on file.  Robin Provencal, MD [facility]

## 2024-01-02 NOTE — Telephone Encounter (Signed)
-----   Message from [facility] Reena S sent at 01/02/2024  3:06 PM EDT ----- Ok to schedule CT at [facility]. I have no idea why this hasn't already been done??  Thanks! Margit

## 2024-01-02 NOTE — Telephone Encounter (Signed)
 Scheduled patient for a CT for July 9th @230 

## 2024-01-04 ENCOUNTER — Telehealth: Payer: Self-pay | Admitting: *Deleted

## 2024-01-04 NOTE — Telephone Encounter (Signed)
 Mrs Robin [name] called for a refill on her oxycodone  acetaminophen  5-325.  Per PMP 12/01/2023 Oxycodone -Acetaminophen  5-325 #60.00

## 2024-01-05 ENCOUNTER — Telehealth: Payer: Self-pay | Admitting: Registered Nurse

## 2024-01-05 ENCOUNTER — Other Ambulatory Visit (HOSPITAL_COMMUNITY): Payer: Self-pay

## 2024-01-05 MED ORDER — OXYCODONE-ACETAMINOPHEN 5-325 MG PO TABS
1.0000 | ORAL_TABLET | Freq: Two times a day (BID) | ORAL | 0 refills | Status: DC | PRN
  Filled 2024-01-05: qty 60, 30d supply, fill #0

## 2024-01-05 NOTE — Telephone Encounter (Signed)
 PMP was Reviewed.  Oxycodone  e-scribed to pharmacy.  Robin [name] is aware via My-Chart

## 2024-01-09 ENCOUNTER — Telehealth: Payer: Self-pay | Admitting: Family Medicine

## 2024-01-09 NOTE — Telephone Encounter (Signed)
 Patient was identified as falling into the [place] Measure - Diabetes.   Patient was: Appointment already scheduled for:  01/16/24.

## 2024-01-16 ENCOUNTER — Ambulatory Visit: Admitting: Family Medicine

## 2024-01-16 NOTE — Progress Notes (Deleted)
    SUBJECTIVE:   CHIEF COMPLAINT / HPI: diabetes f/u  ***  PERTINENT  PMH / PSH: PAD, HTN, T2DM  OBJECTIVE:   LMP 08/11/2011   ***  Foot Exam -   ASSESSMENT/PLAN:   Assessment & Plan  No follow-ups on file.  Ozell Provencal, MD [facility]

## 2024-01-17 ENCOUNTER — Ambulatory Visit (HOSPITAL_COMMUNITY)
Admission: RE | Admit: 2024-01-17 | Discharge: 2024-01-17 | Disposition: A | Discharge status: Home / Self Care | Source: Ambulatory Visit | Attending: Family Medicine | Admitting: Family Medicine

## 2024-01-17 DIAGNOSIS — Z720 Tobacco use: Secondary | ICD-10-CM

## 2024-01-17 DIAGNOSIS — I700 Atherosclerosis of aorta: Secondary | ICD-10-CM | POA: Insufficient documentation

## 2024-01-17 DIAGNOSIS — F17210 Nicotine dependence, cigarettes, uncomplicated: Secondary | ICD-10-CM | POA: Diagnosis not present

## 2024-01-17 DIAGNOSIS — I2510 Atherosclerotic heart disease of native coronary artery without angina pectoris: Secondary | ICD-10-CM | POA: Diagnosis not present

## 2024-01-17 DIAGNOSIS — Z122 Encounter for screening for malignant neoplasm of respiratory organs: Secondary | ICD-10-CM | POA: Insufficient documentation

## 2024-01-19 ENCOUNTER — Other Ambulatory Visit (HOSPITAL_COMMUNITY): Payer: Self-pay

## 2024-01-19 ENCOUNTER — Other Ambulatory Visit: Payer: Self-pay | Admitting: Family Medicine

## 2024-01-19 DIAGNOSIS — I10 Essential (primary) hypertension: Secondary | ICD-10-CM

## 2024-01-22 ENCOUNTER — Other Ambulatory Visit: Payer: Self-pay

## 2024-01-22 ENCOUNTER — Other Ambulatory Visit (HOSPITAL_COMMUNITY): Payer: Self-pay

## 2024-01-22 MED ORDER — HYDROCHLOROTHIAZIDE 25 MG PO TABS
25.0000 mg | ORAL_TABLET | Freq: Every day | ORAL | 0 refills | Status: DC
  Filled 2024-01-22: qty 90, 90d supply, fill #0

## 2024-01-24 ENCOUNTER — Ambulatory Visit: Payer: Self-pay | Admitting: Family Medicine

## 2024-01-24 DIAGNOSIS — I2510 Atherosclerotic heart disease of native coronary artery without angina pectoris: Secondary | ICD-10-CM | POA: Insufficient documentation

## 2024-01-24 DIAGNOSIS — Z720 Tobacco use: Secondary | ICD-10-CM

## 2024-01-24 NOTE — Telephone Encounter (Signed)
 Called patient regarding CT lung results.  Left HIPAA compliant voicemail.  Updated problem list.

## 2024-01-25 ENCOUNTER — Encounter: Payer: Self-pay | Admitting: Family Medicine

## 2024-01-29 ENCOUNTER — Encounter: Admitting: Physical Medicine and Rehabilitation

## 2024-01-30 ENCOUNTER — Ambulatory Visit: Admitting: Family Medicine

## 2024-01-30 ENCOUNTER — Other Ambulatory Visit (HOSPITAL_COMMUNITY): Payer: Self-pay

## 2024-01-30 VITALS — BP 106/85 | HR 92 | Wt 122.2 lb

## 2024-01-30 DIAGNOSIS — E118 Type 2 diabetes mellitus with unspecified complications: Secondary | ICD-10-CM

## 2024-01-30 DIAGNOSIS — I10 Essential (primary) hypertension: Secondary | ICD-10-CM

## 2024-01-30 LAB — POCT GLYCOSYLATED HEMOGLOBIN (HGB A1C): HbA1c, POC (controlled diabetic range): 8.3 % — AB (ref 0.0–7.0)

## 2024-01-30 MED ORDER — HYDROCHLOROTHIAZIDE 25 MG PO TABS
12.5000 mg | ORAL_TABLET | Freq: Every day | ORAL | 0 refills | Status: DC
  Filled 2024-01-30 – 2024-05-02 (×3): qty 30, 60d supply, fill #0

## 2024-01-30 NOTE — Assessment & Plan Note (Addendum)
 Orthostatic hypotensive symptoms.  Plan for decrease of HCTZ to 12.5 mg daily continue Benicar  20 mg daily.  Discussed plan with patient.  Follow-up 1 month.

## 2024-01-30 NOTE — Patient Instructions (Addendum)
 It was great to see you! Thank you for allowing me to participate in your care!  Our plans for today:  - Please go and get your mammogram, please call the breast center to schedule. - Please schedule with you eye doctor. - I will see you again in 2 weeks to see how your blood pressure is doing. - I have decreased your hydrochlorothiazide  to 12.5 mg daily. You can cut your pills in half. - Please continue taking your Benicar . - Please continue taking your Metformin .    Please arrive 15 minutes PRIOR to your next scheduled appointment time! If you do not, this affects OTHER patients' care.  Take care and seek immediate care sooner if you develop any concerns.   Ozell Provencal, MD, PGY-3 [facility] 2:05 PM 01/30/2024  [facility]

## 2024-01-30 NOTE — Progress Notes (Signed)
    SUBJECTIVE:   CHIEF COMPLAINT / HPI: diabetes f/u  T2DM - taking metformin , unable to afford jardiance , watching what she eats, checked sugar yesterday, 117 before eating around 5pm, not taking metformin , no peripheral neuropathy  HTN - reports BP was 80/60 this AM, felt dizzy, rehydrated and felt better. Taking Benicar , and hydrochlorothiazide , was elevated last week, feels like it is low 2-3 times per week, happens mostly when she starts walking, feels faint and then has to fine a place to sit down.  Smoking - cannot afford chantix   Discussed benign screening CT.   PERTINENT  PMH / PSH: PAD, HTN, T2DM, CAD  OBJECTIVE:   BP 106/85   Pulse 92   Wt 122 lb 3.2 oz (55.4 kg)   LMP 08/11/2011   SpO2 94%   BMI 20.98 kg/m   General: NAD, well appearing Neuro: A&O Respiratory: normal WOB on RA Extremities: Moving all 4 extremities equally  Foot Exam - DP intact, grossly normal sensation  ASSESSMENT/PLAN:   Assessment & Plan Controlled type 2 diabetes mellitus with complication, without long-term current use of insulin  (HCC) A1c improved to 8.3 continue metformin  1000 mg twice daily, glipizide  removed from med list, Jardiance  removed from med list.  Unable to afford Jardiance .  Appears to be improving without taking glipizide  all, reasonable to monitor and recheck A1c in 3 months. - BMP Essential hypertension, benign Orthostatic hypotensive symptoms.  Plan for decrease of HCTZ to 12.5 mg daily continue Benicar  20 mg daily.  Discussed plan with patient.  Follow-up 1 month.  Return in about 42 weeks (around 11/19/2024).  Robin Provencal, MD [facility]

## 2024-01-31 ENCOUNTER — Ambulatory Visit: Payer: Self-pay | Admitting: Family Medicine

## 2024-01-31 LAB — BASIC METABOLIC PANEL WITH GFR
BUN/Creatinine Ratio: 20 (ref 12–28)
BUN: 19 mg/dL (ref 8–27)
CO2: 22 mmol/L (ref 20–29)
Calcium: 9.4 mg/dL (ref 8.7–10.3)
Chloride: 99 mmol/L (ref 96–106)
Creatinine, Ser: 0.93 mg/dL (ref 0.57–1.00)
Glucose: 125 mg/dL — ABNORMAL HIGH (ref 70–99)
Potassium: 4.4 mmol/L (ref 3.5–5.2)
Sodium: 138 mmol/L (ref 134–144)
eGFR: 67 mL/min/1.73 (ref >59)

## 2024-02-14 ENCOUNTER — Encounter: Payer: Self-pay | Admitting: Physical Medicine and Rehabilitation

## 2024-02-14 ENCOUNTER — Encounter: Attending: Physical Medicine and Rehabilitation | Admitting: Physical Medicine and Rehabilitation

## 2024-02-14 ENCOUNTER — Other Ambulatory Visit (HOSPITAL_COMMUNITY): Payer: Self-pay

## 2024-02-14 VITALS — BP 120/82 | HR 104 | Ht 64.0 in | Wt 124.0 lb

## 2024-02-14 DIAGNOSIS — Z9889 Other specified postprocedural states: Secondary | ICD-10-CM | POA: Diagnosis not present

## 2024-02-14 DIAGNOSIS — G9520 Unspecified cord compression: Secondary | ICD-10-CM | POA: Insufficient documentation

## 2024-02-14 DIAGNOSIS — Z720 Tobacco use: Secondary | ICD-10-CM | POA: Insufficient documentation

## 2024-02-14 DIAGNOSIS — R252 Cramp and spasm: Secondary | ICD-10-CM | POA: Diagnosis not present

## 2024-02-14 MED ORDER — DULOXETINE HCL 20 MG PO CPEP
40.0000 mg | ORAL_CAPSULE | Freq: Every day | ORAL | 5 refills | Status: AC
  Filled 2024-02-14: qty 60, 30d supply, fill #0
  Filled 2024-03-17: qty 60, 30d supply, fill #1
  Filled 2024-05-02: qty 60, 30d supply, fill #2
  Filled 2024-06-05: qty 60, 30d supply, fill #3
  Filled 2024-07-19: qty 60, 30d supply, fill #4

## 2024-02-14 MED ORDER — TIZANIDINE HCL 4 MG PO TABS
4.0000 mg | ORAL_TABLET | Freq: Two times a day (BID) | ORAL | 5 refills | Status: AC
  Filled 2024-02-14: qty 60, 30d supply, fill #0
  Filled 2024-03-17: qty 60, 30d supply, fill #1
  Filled 2024-05-02: qty 60, 30d supply, fill #2
  Filled 2024-06-05: qty 60, 30d supply, fill #3
  Filled 2024-07-19: qty 60, 30d supply, fill #4

## 2024-02-14 MED ORDER — OXYCODONE-ACETAMINOPHEN 5-325 MG PO TABS
1.0000 | ORAL_TABLET | Freq: Two times a day (BID) | ORAL | 0 refills | Status: DC | PRN
  Filled 2024-02-14: qty 60, 30d supply, fill #0

## 2024-02-14 MED ORDER — GABAPENTIN 600 MG PO TABS
600.0000 mg | ORAL_TABLET | Freq: Three times a day (TID) | ORAL | 5 refills | Status: AC
  Filled 2024-02-14 – 2024-02-19 (×2): qty 90, 30d supply, fill #0
  Filled 2024-03-17: qty 90, 30d supply, fill #1
  Filled 2024-05-02: qty 90, 30d supply, fill #2
  Filled 2024-06-05: qty 90, 30d supply, fill #3
  Filled 2024-07-19: qty 90, 30d supply, fill #4

## 2024-02-14 MED ORDER — OXYCODONE-ACETAMINOPHEN 5-325 MG PO TABS
1.0000 | ORAL_TABLET | Freq: Two times a day (BID) | ORAL | 0 refills | Status: DC | PRN
  Filled 2024-02-14 – 2024-05-16 (×2): qty 60, 30d supply, fill #0

## 2024-02-14 NOTE — Patient Instructions (Signed)
 Pt is a 68 yr old female with hx of Traumatic incomplete ASIA D quadriplegia  (surgery July 4th 2024) due to ground level fall and severe cervical stenosis- with associated nerve and post fall pain; also has spasticity- and also has DM and HTN Here for  f/u for SCI   Last UDS was 11/29/23- isn't due today- per clinic policy.   2.    Wants to stick with  Gabapentin  after we discussed Keppra and Lyrica and only 4 meds FDA approved for nerve pain.  -will con't Gabapentin -  we can try to decrease gabapentin  to 600 mg 2x/day x 1 week, then 600 mg nightly- and then see if it really makes a difference. At the end,if pain is worse, then we will know it helps more than expected.    3.  Try Salonpas patches- let me know if it works at all.   4.  Will refill Percocet 5/325 2x/day as needed- #60-  x 2 refills- until she sees [place].   5. Con't Duloxetine  40 mg daily-  sent in refills  6. Sent in Refills for Tizanidine  4 mg 2x/day  7. Discussed how to try and get meds refilled regularly- running out can increase pain short term and long term.   8. F/U in 2 months with Fidela and me in 4 months f/U on SCI
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
## 2024-02-16 ENCOUNTER — Ambulatory Visit: Admitting: Family Medicine

## 2024-02-16 DIAGNOSIS — I739 Peripheral vascular disease, unspecified: Secondary | ICD-10-CM | POA: Diagnosis not present

## 2024-02-16 DIAGNOSIS — I719 Aortic aneurysm of unspecified site, without rupture: Secondary | ICD-10-CM | POA: Diagnosis not present

## 2024-02-16 NOTE — Progress Notes (Deleted)
    SUBJECTIVE:   CHIEF COMPLAINT / HPI: BP f/u  ***  PERTINENT  PMH / PSH: HTN, T2DM, PAD, CAD  OBJECTIVE:   LMP 08/11/2011   ***  ASSESSMENT/PLAN:   Assessment & Plan  No follow-ups on file.  Ozell Provencal, MD [facility]

## 2024-02-19 ENCOUNTER — Other Ambulatory Visit (HOSPITAL_COMMUNITY): Payer: Self-pay

## 2024-02-27 ENCOUNTER — Other Ambulatory Visit (HOSPITAL_COMMUNITY): Payer: Self-pay

## 2024-03-15 ENCOUNTER — Other Ambulatory Visit (HOSPITAL_COMMUNITY): Payer: Self-pay

## 2024-03-17 ENCOUNTER — Other Ambulatory Visit: Payer: Self-pay | Admitting: Physical Medicine and Rehabilitation

## 2024-03-17 ENCOUNTER — Other Ambulatory Visit (HOSPITAL_COMMUNITY): Payer: Self-pay

## 2024-03-18 ENCOUNTER — Other Ambulatory Visit: Payer: Self-pay

## 2024-03-18 ENCOUNTER — Other Ambulatory Visit (HOSPITAL_COMMUNITY): Payer: Self-pay

## 2024-03-18 MED ORDER — OXYCODONE-ACETAMINOPHEN 5-325 MG PO TABS
1.0000 | ORAL_TABLET | Freq: Two times a day (BID) | ORAL | 0 refills | Status: DC | PRN
  Filled 2024-03-18: qty 60, 30d supply, fill #0

## 2024-04-03 ENCOUNTER — Encounter: Payer: Self-pay | Admitting: Family Medicine

## 2024-04-10 ENCOUNTER — Encounter: Attending: Physical Medicine and Rehabilitation | Admitting: Registered Nurse

## 2024-04-10 ENCOUNTER — Other Ambulatory Visit (HOSPITAL_COMMUNITY): Payer: Self-pay

## 2024-04-10 ENCOUNTER — Encounter: Payer: Self-pay | Admitting: Registered Nurse

## 2024-04-10 VITALS — BP 125/72 | HR 100 | Ht 64.0 in | Wt 120.0 lb

## 2024-04-10 DIAGNOSIS — M25511 Pain in right shoulder: Secondary | ICD-10-CM | POA: Diagnosis not present

## 2024-04-10 DIAGNOSIS — R252 Cramp and spasm: Secondary | ICD-10-CM | POA: Insufficient documentation

## 2024-04-10 DIAGNOSIS — Z79891 Long term (current) use of opiate analgesic: Secondary | ICD-10-CM | POA: Insufficient documentation

## 2024-04-10 DIAGNOSIS — G9520 Unspecified cord compression: Secondary | ICD-10-CM | POA: Insufficient documentation

## 2024-04-10 DIAGNOSIS — Z5181 Encounter for therapeutic drug level monitoring: Secondary | ICD-10-CM | POA: Insufficient documentation

## 2024-04-10 DIAGNOSIS — G8929 Other chronic pain: Secondary | ICD-10-CM | POA: Insufficient documentation

## 2024-04-10 DIAGNOSIS — M25512 Pain in left shoulder: Secondary | ICD-10-CM | POA: Insufficient documentation

## 2024-04-10 DIAGNOSIS — M7062 Trochanteric bursitis, left hip: Secondary | ICD-10-CM | POA: Diagnosis not present

## 2024-04-10 DIAGNOSIS — M7061 Trochanteric bursitis, right hip: Secondary | ICD-10-CM | POA: Insufficient documentation

## 2024-04-10 DIAGNOSIS — G894 Chronic pain syndrome: Secondary | ICD-10-CM | POA: Diagnosis not present

## 2024-04-10 DIAGNOSIS — M21372 Foot drop, left foot: Secondary | ICD-10-CM | POA: Diagnosis not present

## 2024-04-10 MED ORDER — OXYCODONE-ACETAMINOPHEN 5-325 MG PO TABS
1.0000 | ORAL_TABLET | Freq: Two times a day (BID) | ORAL | 0 refills | Status: DC | PRN
  Filled 2024-04-10 – 2024-05-16 (×3): qty 60, 30d supply, fill #0
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
## 2024-04-14 LAB — DRUG TOX MONITOR 1 W/CONF, ORAL FLD
Amphetamines: NEGATIVE ng/mL (ref <10)
Barbiturates: NEGATIVE ng/mL (ref <10)
Benzodiazepines: NEGATIVE ng/mL (ref <0.50)
Buprenorphine: NEGATIVE ng/mL (ref <0.10)
Cocaine: NEGATIVE ng/mL (ref <5.0)
Codeine: NEGATIVE ng/mL (ref <2.5)
Cotinine: >250 ng/mL — ABNORMAL HIGH (ref <5.0)
Dihydrocodeine: NEGATIVE ng/mL (ref <2.5)
Fentanyl: NEGATIVE ng/mL (ref <0.10)
Heroin Metabolite: NEGATIVE ng/mL (ref <1.0)
Hydrocodone: NEGATIVE ng/mL (ref <2.5)
Hydromorphone: NEGATIVE ng/mL (ref <2.5)
MARIJUANA: NEGATIVE ng/mL (ref <2.5)
MDMA: NEGATIVE ng/mL (ref <10)
Meprobamate: NEGATIVE ng/mL (ref <2.5)
Methadone: NEGATIVE ng/mL (ref <5.0)
Morphine: NEGATIVE ng/mL (ref <2.5)
Nicotine Metabolite: POSITIVE ng/mL — AB (ref <5.0)
Norhydrocodone: NEGATIVE ng/mL (ref <2.5)
Noroxycodone: 17.8 ng/mL — ABNORMAL HIGH (ref <2.5)
Opiates: POSITIVE ng/mL — AB (ref <2.5)
Oxycodone: 80 ng/mL — ABNORMAL HIGH (ref <2.5)
Oxymorphone: NEGATIVE ng/mL (ref <2.5)
Phencyclidine: NEGATIVE ng/mL (ref <10)
Tapentadol: NEGATIVE ng/mL (ref <5.0)
Tramadol: NEGATIVE ng/mL (ref <5.0)
Zolpidem: NEGATIVE ng/mL (ref <5.0)

## 2024-04-14 LAB — DRUG TOX ALC METAB W/CON, ORAL FLD: Alcohol Metabolite: NEGATIVE ng/mL (ref <25)

## 2024-04-15 ENCOUNTER — Other Ambulatory Visit (HOSPITAL_COMMUNITY): Payer: Self-pay

## 2024-04-25 ENCOUNTER — Other Ambulatory Visit (HOSPITAL_COMMUNITY): Payer: Self-pay

## 2024-05-02 ENCOUNTER — Other Ambulatory Visit (HOSPITAL_COMMUNITY): Payer: Self-pay

## 2024-05-15 ENCOUNTER — Telehealth: Payer: Self-pay

## 2024-05-15 DIAGNOSIS — G894 Chronic pain syndrome: Secondary | ICD-10-CM

## 2024-05-15 DIAGNOSIS — G8929 Other chronic pain: Secondary | ICD-10-CM

## 2024-05-16 ENCOUNTER — Other Ambulatory Visit (HOSPITAL_COMMUNITY): Payer: Self-pay

## 2024-05-16 ENCOUNTER — Telehealth: Payer: Self-pay | Admitting: Registered Nurse

## 2024-05-16 NOTE — Telephone Encounter (Signed)
 PMP was Reviewed.  Last prescription was filled on 03/18/2024, she should have a prescription at the pharmacy.  [facility] was called, she never picked up her October prescription. The medication is not covered under her insurance. Pharmacy states she paid out of pocket on her last prescription refill.  She was called and voicemail full. Sent a My-Chart message regarding the above.

## 2024-05-23 ENCOUNTER — Other Ambulatory Visit (HOSPITAL_COMMUNITY): Payer: Self-pay

## 2024-05-23 MED ORDER — OXYCODONE-ACETAMINOPHEN 5-325 MG PO TABS
1.0000 | ORAL_TABLET | Freq: Two times a day (BID) | ORAL | 0 refills | Status: DC | PRN
  Filled 2024-05-23: qty 60, 30d supply, fill #0

## 2024-06-05 ENCOUNTER — Other Ambulatory Visit: Payer: Self-pay

## 2024-06-05 ENCOUNTER — Other Ambulatory Visit: Payer: Self-pay | Admitting: Family Medicine

## 2024-06-05 ENCOUNTER — Other Ambulatory Visit (HOSPITAL_COMMUNITY): Payer: Self-pay

## 2024-06-05 DIAGNOSIS — I10 Essential (primary) hypertension: Secondary | ICD-10-CM

## 2024-06-05 MED ORDER — HYDROCHLOROTHIAZIDE 25 MG PO TABS
12.5000 mg | ORAL_TABLET | Freq: Every day | ORAL | 0 refills | Status: AC
  Filled 2024-06-05: qty 30, 60d supply, fill #0

## 2024-06-07 ENCOUNTER — Other Ambulatory Visit (HOSPITAL_COMMUNITY): Payer: Self-pay

## 2024-06-10 ENCOUNTER — Encounter: Payer: Self-pay | Admitting: Physical Medicine and Rehabilitation

## 2024-06-10 ENCOUNTER — Other Ambulatory Visit (HOSPITAL_COMMUNITY): Payer: Self-pay

## 2024-06-10 ENCOUNTER — Encounter: Attending: Physical Medicine and Rehabilitation | Admitting: Physical Medicine and Rehabilitation

## 2024-06-10 VITALS — BP 158/83 | HR 84 | Ht 64.0 in | Wt 126.4 lb

## 2024-06-10 DIAGNOSIS — M25511 Pain in right shoulder: Secondary | ICD-10-CM | POA: Insufficient documentation

## 2024-06-10 DIAGNOSIS — M792 Neuralgia and neuritis, unspecified: Secondary | ICD-10-CM | POA: Insufficient documentation

## 2024-06-10 DIAGNOSIS — G9520 Unspecified cord compression: Secondary | ICD-10-CM | POA: Insufficient documentation

## 2024-06-10 DIAGNOSIS — M25512 Pain in left shoulder: Secondary | ICD-10-CM | POA: Diagnosis not present

## 2024-06-10 DIAGNOSIS — R269 Unspecified abnormalities of gait and mobility: Secondary | ICD-10-CM | POA: Diagnosis not present

## 2024-06-10 DIAGNOSIS — G894 Chronic pain syndrome: Secondary | ICD-10-CM | POA: Insufficient documentation

## 2024-06-10 DIAGNOSIS — G8929 Other chronic pain: Secondary | ICD-10-CM | POA: Insufficient documentation

## 2024-06-10 MED ORDER — OXYCODONE-ACETAMINOPHEN 5-325 MG PO TABS
1.0000 | ORAL_TABLET | Freq: Three times a day (TID) | ORAL | 0 refills | Status: AC | PRN
  Filled 2024-06-10 – 2024-07-19 (×2): qty 90, 30d supply, fill #0

## 2024-06-10 MED ORDER — OXYCODONE-ACETAMINOPHEN 5-325 MG PO TABS
1.0000 | ORAL_TABLET | Freq: Three times a day (TID) | ORAL | 0 refills | Status: DC | PRN
  Filled 2024-06-10 – 2024-06-13 (×2): qty 90, 30d supply, fill #0
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
## 2024-06-11 NOTE — Telephone Encounter (Signed)
 error

## 2024-06-13 ENCOUNTER — Other Ambulatory Visit (HOSPITAL_COMMUNITY): Payer: Self-pay

## 2024-06-17 ENCOUNTER — Other Ambulatory Visit: Payer: Self-pay

## 2024-06-19 ENCOUNTER — Other Ambulatory Visit (HOSPITAL_COMMUNITY): Payer: Self-pay

## 2024-07-09 ENCOUNTER — Other Ambulatory Visit

## 2024-07-09 ENCOUNTER — Encounter: Payer: Self-pay | Admitting: Internal Medicine

## 2024-07-09 ENCOUNTER — Ambulatory Visit: Admitting: Internal Medicine

## 2024-07-09 VITALS — BP 130/70 | HR 109 | Ht 64.0 in | Wt 121.0 lb

## 2024-07-09 DIAGNOSIS — E042 Nontoxic multinodular goiter: Secondary | ICD-10-CM | POA: Diagnosis not present
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
## 2024-07-10 ENCOUNTER — Ambulatory Visit: Payer: Self-pay | Admitting: Internal Medicine

## 2024-07-10 LAB — T4, FREE: Free T4: 1.5 ng/dL (ref 0.8–1.8)

## 2024-07-10 LAB — TSH: TSH: 0.89 m[IU]/L (ref 0.40–4.50)

## 2024-07-19 ENCOUNTER — Other Ambulatory Visit (HOSPITAL_COMMUNITY): Payer: Self-pay

## 2024-07-29 ENCOUNTER — Other Ambulatory Visit: Payer: Self-pay

## 2024-08-09 ENCOUNTER — Encounter: Admitting: Registered Nurse

## 2024-08-14 ENCOUNTER — Encounter: Attending: Physical Medicine and Rehabilitation | Admitting: Registered Nurse

## 2024-08-14 ENCOUNTER — Other Ambulatory Visit (HOSPITAL_COMMUNITY): Payer: Self-pay

## 2024-08-14 ENCOUNTER — Encounter: Payer: Self-pay | Admitting: Registered Nurse

## 2024-08-14 VITALS — BP 125/81 | HR 87 | Ht 64.0 in | Wt 126.0 lb

## 2024-08-14 DIAGNOSIS — Z79891 Long term (current) use of opiate analgesic: Secondary | ICD-10-CM

## 2024-08-14 DIAGNOSIS — G894 Chronic pain syndrome: Secondary | ICD-10-CM | POA: Diagnosis not present

## 2024-08-14 DIAGNOSIS — M25511 Pain in right shoulder: Secondary | ICD-10-CM

## 2024-08-14 DIAGNOSIS — G9520 Unspecified cord compression: Secondary | ICD-10-CM | POA: Diagnosis not present

## 2024-08-14 DIAGNOSIS — M25512 Pain in left shoulder: Secondary | ICD-10-CM

## 2024-08-14 DIAGNOSIS — Z5181 Encounter for therapeutic drug level monitoring: Secondary | ICD-10-CM

## 2024-08-14 DIAGNOSIS — G8929 Other chronic pain: Secondary | ICD-10-CM

## 2024-08-14 MED ORDER — OXYCODONE-ACETAMINOPHEN 5-325 MG PO TABS
1.0000 | ORAL_TABLET | Freq: Three times a day (TID) | ORAL | 0 refills | Status: AC | PRN
  Filled 2024-08-14: qty 90, 30d supply, fill #0
  Filled ????-??-??: fill #0
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
## 2024-10-07 ENCOUNTER — Encounter: Admitting: Physical Medicine and Rehabilitation

## 2024-11-19 ENCOUNTER — Encounter: Admitting: Registered Nurse

## 2024-12-09 ENCOUNTER — Encounter

## 2025-01-31 ENCOUNTER — Encounter: Admitting: Physical Medicine and Rehabilitation

## 2025-07-09 ENCOUNTER — Ambulatory Visit: Admitting: Internal Medicine
